# Patient Record
Sex: Male | Born: 1960 | Race: Black or African American | Hispanic: No | Marital: Married | Smoking: Never smoker
Health system: Southern US, Community
[De-identification: ages and names within clinical notes are randomized; demographics above are authoritative.]

## PROBLEM LIST (undated history)

## (undated) DIAGNOSIS — E119 Type 2 diabetes mellitus without complications: Secondary | ICD-10-CM

## (undated) DIAGNOSIS — I10 Essential (primary) hypertension: Secondary | ICD-10-CM

## (undated) HISTORY — PX: HERNIA REPAIR: SHX51

---

## 2021-10-19 ENCOUNTER — Emergency Department: Payer: No Typology Code available for payment source

## 2021-10-19 ENCOUNTER — Emergency Department
Admission: EM | Admit: 2021-10-19 | Discharge: 2021-10-19 | Disposition: A | Discharge status: Home / Self Care | Payer: No Typology Code available for payment source | Attending: Student in an Organized Health Care Education/Training Program | Admitting: Student in an Organized Health Care Education/Training Program

## 2021-10-19 ENCOUNTER — Other Ambulatory Visit: Payer: Self-pay

## 2021-10-19 DIAGNOSIS — R1084 Generalized abdominal pain: Secondary | ICD-10-CM | POA: Insufficient documentation

## 2021-10-19 DIAGNOSIS — I10 Essential (primary) hypertension: Secondary | ICD-10-CM | POA: Diagnosis not present

## 2021-10-19 DIAGNOSIS — R109 Unspecified abdominal pain: Secondary | ICD-10-CM | POA: Diagnosis present

## 2021-10-19 HISTORY — DX: Essential (primary) hypertension: I10

## 2021-10-19 HISTORY — DX: Type 2 diabetes mellitus without complications: E11.9

## 2021-10-19 LAB — URINALYSIS, ROUTINE W REFLEX MICROSCOPIC
Bilirubin Urine: NEGATIVE
Glucose, UA: >=500 mg/dL — AB
Hgb urine dipstick: NEGATIVE
Ketones, ur: NEGATIVE mg/dL
Leukocytes,Ua: NEGATIVE
Nitrite: NEGATIVE
Protein, ur: NEGATIVE mg/dL
Specific Gravity, Urine: 1.025 (ref 1.005–1.030)
Squamous Epithelial / HPF: NONE SEEN (ref 0–5)
WBC, UA: NONE SEEN WBC/hpf (ref 0–5)
pH: 6 (ref 5.0–8.0)

## 2021-10-19 LAB — CBC
HCT: 50.1 % (ref 39.0–52.0)
Hemoglobin: 17 g/dL (ref 13.0–17.0)
MCH: 32.3 pg (ref 26.0–34.0)
MCHC: 33.9 g/dL (ref 30.0–36.0)
MCV: 95.1 fL (ref 80.0–100.0)
Platelets: 222 10*3/uL (ref 150–400)
RBC: 5.27 MIL/uL (ref 4.22–5.81)
RDW: 11.6 % (ref 11.5–15.5)
WBC: 9.8 10*3/uL (ref 4.0–10.5)
nRBC: 0 % (ref 0.0–0.2)

## 2021-10-19 LAB — COMPREHENSIVE METABOLIC PANEL
ALT: 27 U/L (ref 0–44)
AST: 17 U/L (ref 15–41)
Albumin: 2.6 g/dL — ABNORMAL LOW (ref 3.5–5.0)
Alkaline Phosphatase: 106 U/L (ref 38–126)
Anion gap: 10 (ref 5–15)
BUN: 7 mg/dL (ref 6–20)
CO2: 28 mmol/L (ref 22–32)
Calcium: 8.2 mg/dL — ABNORMAL LOW (ref 8.9–10.3)
Chloride: 94 mmol/L — ABNORMAL LOW (ref 98–111)
Creatinine, Ser: 0.79 mg/dL (ref 0.61–1.24)
GFR, Estimated: >60 mL/min (ref >60)
Glucose, Bld: 377 mg/dL — ABNORMAL HIGH (ref 70–99)
Potassium: 3.7 mmol/L (ref 3.5–5.1)
Sodium: 132 mmol/L — ABNORMAL LOW (ref 135–145)
Total Bilirubin: 1.2 mg/dL (ref 0.3–1.2)
Total Protein: 5.9 g/dL — ABNORMAL LOW (ref 6.5–8.1)

## 2021-10-19 LAB — TROPONIN I (HIGH SENSITIVITY)
Troponin I (High Sensitivity): 23 ng/L — ABNORMAL HIGH (ref <18)
Troponin I (High Sensitivity): 24 ng/L — ABNORMAL HIGH (ref <18)

## 2021-10-19 LAB — LIPASE, BLOOD: Lipase: 36 U/L (ref 11–51)

## 2021-10-19 MED ORDER — IOHEXOL 300 MG/ML  SOLN
100.0000 mL | Freq: Once | INTRAMUSCULAR | Status: AC | PRN
  Administered 2021-10-19: 100 mL via INTRAVENOUS

## 2021-10-19 MED ORDER — PANTOPRAZOLE SODIUM 20 MG PO TBEC: 20.0000 mg | DELAYED_RELEASE_TABLET | Freq: Every day | ORAL | 1 refills | Status: DC

## 2021-10-19 MED ORDER — PANTOPRAZOLE SODIUM 40 MG IV SOLR
40.0000 mg | Freq: Once | INTRAVENOUS | Status: AC
  Administered 2021-10-19: 40 mg via INTRAVENOUS
  Filled 2021-10-19: qty 10

## 2021-10-19 MED ORDER — INSULIN ASPART 100 UNIT/ML IJ SOLN
5.0000 [IU] | Freq: Once | INTRAMUSCULAR | Status: AC
  Administered 2021-10-19: 5 [IU] via SUBCUTANEOUS
  Filled 2021-10-19: qty 1

## 2021-10-19 NOTE — Discharge Instructions (Signed)

## 2021-10-19 NOTE — ED Triage Notes (Signed)
Pt comes with c/o abdominal pain, excessive burping, vomiting and decreased appetite. ?

## 2021-10-19 NOTE — ED Notes (Signed)
Pt provided urinal for urine sample.

## 2021-10-19 NOTE — ED Provider Notes (Signed)
? ?[facility] ?Provider Note ? ? ? Event Date/Time  ? First MD Initiated Contact with Patient 10/19/21 0940   ?  (approximate) ? ? ?History  ? ?Abdominal Pain ? ? ?HPI ? ?Bronsen Serano is a 61 y.o. male with a history of hypertension and hiatal hernia presents to the ER for evaluation of worsening epigastric pain belching vomiting and poor p.o. intake.  States his symptoms of getting worse over the past several months.  Has been having intermittent episodes of chest discomfort 2.  Denies any history of GERD or reflux.  Denies any new medications.  Denies any fevers or chills no shortness of breath. ?  ? ? ?Physical Exam  ? ?Triage Vital Signs: ?ED Triage Vitals  ?Enc Vitals Group  ?   BP 10/19/21 0939 (!) 187/95  ?   Pulse Rate 10/19/21 0939 94  ?   Resp 10/19/21 0939 18  ?   Temp 10/19/21 0939 98.4 ?F (36.9 ?C)  ?   Temp Source 10/19/21 0939 Oral  ?   SpO2 10/19/21 0939 97 %  ?   Weight --   ?   Height --   ?   Head Circumference --   ?   Peak Flow --   ?   Pain Score 10/19/21 0937 0  ?   Pain Loc --   ?   Pain Edu? --   ?   Excl. in [place]? --   ? ? ?Most recent vital signs: ?Vitals:  ? 10/19/21 1136 10/19/21 1200  ?BP: (!) 161/90 (!) 173/90  ?Pulse: 70 71  ?Resp: 14 12  ?Temp:    ?SpO2: 92% 92%  ? ? ? ?Constitutional: Alert  ?Eyes: Conjunctivae are normal.  ?Head: Atraumatic. ?Nose: No congestion/rhinnorhea. ?Mouth/Throat: Mucous membranes are moist.   ?Neck: Painless ROM.  ?Cardiovascular:   Good peripheral circulation. ?Respiratory: Normal respiratory effort.  No retractions.  ?Gastrointestinal: Soft and nontender.  ?Musculoskeletal:  no deformity ?Neurologic:  MAE spontaneously. No gross focal neurologic deficits are appreciated.  ?Skin:  Skin is warm, dry and intact. No rash noted. ?Psychiatric: Mood and affect are normal. Speech and behavior are normal. ? ? ? ?ED Results / Procedures / Treatments  ? ?Labs ?(all labs ordered are listed, but only abnormal results are displayed) ?Labs  Reviewed  ?URINALYSIS, ROUTINE W REFLEX MICROSCOPIC - Abnormal; Notable for the following components:  ?    Result Value  ? Color, Urine YELLOW (*)   ? APPearance CLEAR (*)   ? Glucose, UA >=500 (*)   ? Bacteria, UA RARE (*)   ? All other components within normal limits  ?COMPREHENSIVE METABOLIC PANEL - Abnormal; Notable for the following components:  ? Sodium 132 (*)   ? Chloride 94 (*)   ? Glucose, Bld 377 (*)   ? Calcium 8.2 (*)   ? Total Protein 5.9 (*)   ? Albumin 2.6 (*)   ? All other components within normal limits  ?TROPONIN I (HIGH SENSITIVITY) - Abnormal; Notable for the following components:  ? Troponin I (High Sensitivity) 23 (*)   ? All other components within normal limits  ?TROPONIN I (HIGH SENSITIVITY) - Abnormal; Notable for the following components:  ? Troponin I (High Sensitivity) 24 (*)   ? All other components within normal limits  ?CBC  ?LIPASE, BLOOD  ? ? ? ?EKG ? ?ED ECG REPORT ?I, Merlyn Lot, the attending physician, personally viewed and interpreted this ECG. ? ? Date: 10/19/2021 ? EKG  Time: 10:06 ? Rate: 90 ? Rhythm: sinus ? Axis: normal ? Intervals:normal ? ST&T Change: lvh, nonspecific st and t wave abn, no stemi criteria ? ? ? ?RADIOLOGY ?Please see ED Course for my review and interpretation. ? ?I personally reviewed all radiographic images ordered to evaluate for the above acute complaints and reviewed radiology reports and findings.  These findings were personally discussed with the patient.  Please see medical record for radiology report. ? ? ? ?PROCEDURES: ? ?Critical Care performed: No ? ?Procedures ? ? ?MEDICATIONS ORDERED IN ED: ?Medications  ?insulin aspart (novoLOG) injection 5 Units (has no administration in time range)  ?pantoprazole (PROTONIX) injection 40 mg (40 mg Intravenous Given 10/19/21 1009)  ?iohexol (OMNIPAQUE) 300 MG/ML solution 100 mL (100 mLs Intravenous Contrast Given 10/19/21 1321)  ? ? ? ?IMPRESSION / MDM / ASSESSMENT AND PLAN / ED COURSE  ?I reviewed the  triage vital signs and the nursing notes. ?             ?               ? ?Differential diagnosis includes, but is not limited to, gastritis, hernia, obstruction, mass, perforation, diverticulitis, PUD, ACS ? ?Presents with symptoms as described above.  Symptoms have been ongoing for quite some time.  Denies any active chest pain or pressure.  EKG is abnormal initial troponin mildly elevated will order repeat troponin as this may be a chronic abnormality for him.  His abdominal exam is benign but given his age and risk factors will order CT imaging for the above differential. ? ?Clinical Course as of 10/19/21 1354  ?Thu Oct 19, 2021  ?1002 Chest x-ray on my review and interpretation shows no evidence of pneumothorax. [PR]  ?1326 Hyperglycemia no acidemia renal function normal. [PR]  ?76 CT abdomen pelvis by my review and interpretation does not show any evidence of SBO or acute intra-abdominal abnormality will await formal radiology report. [PR]  ?1349 Discussed results of CT imaging with the patient.  No acute abnormality.  Is mildly hyperglycemic.  Troponins are stable.  He is denying any chest pain at this time.  I suspect gastritis will give referral to outpatient follow-up I do not feel he requires hospitalization at this time.  Patient agreeable to plan. [PR]  ?  ?Clinical Course User Index ?[PR] Merlyn Lot, MD  ? ? ? ?FINAL CLINICAL IMPRESSION(S) / ED DIAGNOSES  ? ?Final diagnoses:  ?Generalized abdominal pain  ? ? ? ?Rx / DC Orders  ? ?ED Discharge Orders   ? ?      Ordered  ?  pantoprazole (PROTONIX) 20 MG tablet  Daily       ? 10/19/21 1352  ? ?  ?  ? ?  ? ? ? ?Note:  This document was prepared using Dragon voice recognition software and may include unintentional dictation errors. ? ?  ?Merlyn Lot, MD ?10/19/21 1354 ? ?

## 2021-10-19 NOTE — ED Notes (Signed)
Lab called about pending CMP and Lipase, lab states Blood work still running.  ?

## 2021-11-10 ENCOUNTER — Encounter: Payer: Self-pay | Admitting: Emergency Medicine

## 2021-11-10 ENCOUNTER — Emergency Department: Payer: No Typology Code available for payment source

## 2021-11-10 ENCOUNTER — Emergency Department
Admission: EM | Admit: 2021-11-10 | Discharge: 2021-11-10 | Disposition: A | Discharge status: Home / Self Care | Payer: No Typology Code available for payment source | Attending: Emergency Medicine | Admitting: Emergency Medicine

## 2021-11-10 ENCOUNTER — Other Ambulatory Visit: Payer: Self-pay

## 2021-11-10 DIAGNOSIS — R066 Hiccough: Secondary | ICD-10-CM | POA: Diagnosis present

## 2021-11-10 DIAGNOSIS — R142 Eructation: Secondary | ICD-10-CM | POA: Insufficient documentation

## 2021-11-10 DIAGNOSIS — R739 Hyperglycemia, unspecified: Secondary | ICD-10-CM | POA: Diagnosis not present

## 2021-11-10 DIAGNOSIS — R1084 Generalized abdominal pain: Secondary | ICD-10-CM | POA: Diagnosis not present

## 2021-11-10 DIAGNOSIS — R112 Nausea with vomiting, unspecified: Secondary | ICD-10-CM | POA: Insufficient documentation

## 2021-11-10 LAB — CBC
HCT: 47.8 % (ref 39.0–52.0)
Hemoglobin: 16.5 g/dL (ref 13.0–17.0)
MCH: 31.8 pg (ref 26.0–34.0)
MCHC: 34.5 g/dL (ref 30.0–36.0)
MCV: 92.1 fL (ref 80.0–100.0)
Platelets: 272 10*3/uL (ref 150–400)
RBC: 5.19 MIL/uL (ref 4.22–5.81)
RDW: 11.2 % — ABNORMAL LOW (ref 11.5–15.5)
WBC: 9.6 10*3/uL (ref 4.0–10.5)
nRBC: 0 % (ref 0.0–0.2)

## 2021-11-10 LAB — COMPREHENSIVE METABOLIC PANEL
ALT: 21 U/L (ref 0–44)
AST: 25 U/L (ref 15–41)
Albumin: 3 g/dL — ABNORMAL LOW (ref 3.5–5.0)
Alkaline Phosphatase: 98 U/L (ref 38–126)
Anion gap: 13 (ref 5–15)
BUN: 8 mg/dL (ref 6–20)
CO2: 21 mmol/L — ABNORMAL LOW (ref 22–32)
Calcium: 8.2 mg/dL — ABNORMAL LOW (ref 8.9–10.3)
Chloride: 93 mmol/L — ABNORMAL LOW (ref 98–111)
Creatinine, Ser: 1.02 mg/dL (ref 0.61–1.24)
GFR, Estimated: >60 mL/min (ref >60)
Glucose, Bld: 651 mg/dL (ref 70–99)
Potassium: 3.7 mmol/L (ref 3.5–5.1)
Sodium: 127 mmol/L — ABNORMAL LOW (ref 135–145)
Total Bilirubin: 1.1 mg/dL (ref 0.3–1.2)
Total Protein: 6.7 g/dL (ref 6.5–8.1)

## 2021-11-10 LAB — LIPASE, BLOOD: Lipase: 47 U/L (ref 11–51)

## 2021-11-10 LAB — CBG MONITORING, ED: Glucose-Capillary: 285 mg/dL — ABNORMAL HIGH (ref 70–99)

## 2021-11-10 MED ORDER — OMEPRAZOLE 10 MG PO CPDR: 10.0000 mg | DELAYED_RELEASE_CAPSULE | Freq: Every day | ORAL | 3 refills | Status: DC

## 2021-11-10 MED ORDER — BACLOFEN 10 MG PO TABS
10.0000 mg | ORAL_TABLET | Freq: Three times a day (TID) | ORAL | Status: DC
  Administered 2021-11-10: 10 mg via ORAL
  Filled 2021-11-10 (×2): qty 1

## 2021-11-10 MED ORDER — BACLOFEN 10 MG PO TABS
10.0000 mg | ORAL_TABLET | Freq: Every day | ORAL | 1 refills | Status: DC | PRN
Start: 2021-11-10 — End: 2022-07-10

## 2021-11-10 MED ORDER — SODIUM CHLORIDE 0.9 % IV BOLUS
2000.0000 mL | Freq: Once | INTRAVENOUS | Status: AC
  Administered 2021-11-10: 2000 mL via INTRAVENOUS

## 2021-11-10 MED ORDER — FAMOTIDINE 20 MG PO TABS: 20.0000 mg | ORAL_TABLET | Freq: Every day | ORAL | 3 refills | Status: DC

## 2021-11-10 MED ORDER — LIDOCAINE VISCOUS HCL 2 % MT SOLN
15.0000 mL | Freq: Once | OROMUCOSAL | Status: AC
  Administered 2021-11-10: 15 mL via ORAL
  Filled 2021-11-10: qty 15

## 2021-11-10 MED ORDER — INSULIN ASPART 100 UNIT/ML IJ SOLN
15.0000 [IU] | Freq: Once | INTRAMUSCULAR | Status: AC
  Administered 2021-11-10: 15 [IU] via INTRAVENOUS
  Filled 2021-11-10: qty 1

## 2021-11-10 MED ORDER — IOHEXOL 300 MG/ML  SOLN
100.0000 mL | Freq: Once | INTRAMUSCULAR | Status: AC | PRN
  Administered 2021-11-10: 100 mL via INTRAVENOUS
  Filled 2021-11-10: qty 100

## 2021-11-10 MED ORDER — ALUM & MAG HYDROXIDE-SIMETH 200-200-20 MG/5ML PO SUSP
30.0000 mL | Freq: Once | ORAL | Status: AC
  Administered 2021-11-10: 30 mL via ORAL
  Filled 2021-11-10: qty 30

## 2021-11-10 NOTE — ED Provider Triage Note (Signed)
Emergency Medicine Provider Triage Evaluation Note ? ?Corey Petersen , a 61 y.o. male  was evaluated in triage.  Pt complains of belching and hiccups all day every day for a year.  Patient states that he has been worked up multiple times, never received a definitive answer but will have hiccups and burping until he vomits.  Patient denies any fevers or chills, no new symptoms, does states that these have been ongoing for a year and he needs to be seen.. ? ?Review of Systems  ?Positive: Hiccups, belching, emesis ?Negative: Fevers, chills, URI symptoms ? ?Physical Exam  ?BP (!) 153/90   Pulse (!) 114   Temp 98.4 ?F (36.9 ?C) (Oral)   Resp 20   Ht '5\' 7"'$  (1.702 m)   Wt 113.4 kg   SpO2 98%   BMI 39.16 kg/m?  ?Gen:   Awake, no distress   ?Resp:  Normal effort  ?MSK:   Moves extremities without difficulty  ?Other:  Bowel sounds present.  No definitive tenderness to palpation. ? ?Medical Decision Making  ?Medically screening exam initiated at 4:39 PM.  Appropriate orders placed.  Corey Petersen was informed that the remainder of the evaluation will be completed by another provider, this initial triage assessment does not replace that evaluation, and the importance of remaining in the ED until their evaluation is complete. ? ?Patient presents with belching and hiccups for reportedly a year.  Patient states that he has repetitive episodes of emesis with same.  He has been worked up multiple times without any definitive answer according to the patient.  Patient will have labs at this time. ?  ?Darletta Moll, PA-C ?11/10/21 1639 ? ?

## 2021-11-10 NOTE — ED Provider Notes (Signed)
? ?[facility] ?Provider Note ? ?Patient Contact: 5:00 PM (approximate) ? ? ?History  ? ?Emesis ? ? ?HPI ? ?Corey Petersen is a 61 y.o. male who presents the emergency department complaining of ongoing belching, hiccups and emesis.  Patient reports that this has been ongoing times a year.  He has been seen multiple places and states that he has never been given a reason just started on medications.  Patient states that sometimes the medicines appear to be more effective, sometimes less effective.  Patient states that today the medicines been less effective and has had steady belching, as well as multiple episodes of emesis.  No hematic emesis.  He denies any abdominal pain.  Patient denies any constipation, diarrhea, urinary changes. ?  ? ? ?Physical Exam  ? ?Triage Vital Signs: ?ED Triage Vitals  ?Enc Vitals Group  ?   BP 11/10/21 1629 (!) 153/90  ?   Pulse Rate 11/10/21 1629 (!) 114  ?   Resp 11/10/21 1629 20  ?   Temp 11/10/21 1629 98.4 ?F (36.9 ?C)  ?   Temp Source 11/10/21 1629 Oral  ?   SpO2 11/10/21 1629 98 %  ?   Weight 11/10/21 1630 250 lb (113.4 kg)  ?   Height 11/10/21 1630 '5\' 7"'$  (1.702 m)  ?   Head Circumference --   ?   Peak Flow --   ?   Pain Score 11/10/21 1630 0  ?   Pain Loc --   ?   Pain Edu? --   ?   Excl. in [place]? --   ? ? ?Most recent vital signs: ?Vitals:  ? 11/10/21 1756 11/10/21 2020  ?BP: (!) 140/99 (!) 165/90  ?Pulse: 87 82  ?Resp: 18 18  ?Temp: 98.4 ?F (36.9 ?C)   ?SpO2: 98% 98%  ? ? ? ?General: Alert and in no acute distress. ?ENT: ?     Ears:  ?     Nose: No congestion/rhinnorhea. ?     Mouth/Throat: Mucous membranes are moist.  ?Cardiovascular:  Good peripheral perfusion ?Respiratory: Normal respiratory effort without tachypnea or retractions. Lungs CTAB. Good air entry to the bases with no decreased or absent breath sounds. ?Gastrointestinal: Bowel sounds ?4 quadrants.  Soft to palpation globally.  Patient reports mild global tenderness but no point specific  tenderness.. No guarding or rigidity. No palpable masses. No distention. No CVA tenderness. ?Musculoskeletal: Full range of motion to all extremities.  ?Neurologic:  No gross focal neurologic deficits are appreciated.  ?Skin:   No rash noted ?Other: ? ? ?ED Results / Procedures / Treatments  ? ?Labs ?(all labs ordered are listed, but only abnormal results are displayed) ?Labs Reviewed  ?COMPREHENSIVE METABOLIC PANEL - Abnormal; Notable for the following components:  ?    Result Value  ? Sodium 127 (*)   ? Chloride 93 (*)   ? CO2 21 (*)   ? Glucose, Bld 651 (*)   ? Calcium 8.2 (*)   ? Albumin 3.0 (*)   ? All other components within normal limits  ?CBC - Abnormal; Notable for the following components:  ? RDW 11.2 (*)   ? All other components within normal limits  ?CBG MONITORING, ED - Abnormal; Notable for the following components:  ? Glucose-Capillary 285 (*)   ? All other components within normal limits  ?LIPASE, BLOOD  ? ? ? ?EKG ? ? ? ? ?RADIOLOGY ? ?I personally viewed and evaluated these images as part of my  medical decision making, as well as reviewing the written report by the radiologist. ? ?ED Provider Interpretation: No acute findings on CT to include obstruction, cholecystitis, pancreatitis, gastric perforation ? ?CT ABDOMEN PELVIS W CONTRAST ? ?Result Date: 11/10/2021 ?CLINICAL DATA:  Nausea and vomiting.  Hiccups and belching. EXAM: CT ABDOMEN AND PELVIS WITH CONTRAST TECHNIQUE: Multidetector CT imaging of the abdomen and pelvis was performed using the standard protocol following bolus administration of intravenous contrast. RADIATION DOSE REDUCTION: This exam was performed according to the departmental dose-optimization program which includes automated exposure control, adjustment of the mA and/or kV according to patient size and/or use of iterative reconstruction technique. CONTRAST:  13m OMNIPAQUE IOHEXOL 300 MG/ML  SOLN COMPARISON:  10/19/2021 FINDINGS: Lower chest: Normal Hepatobiliary: Advanced  steatosis of the liver. Calcified stones dependent within the gallbladder. No CT evidence of gallbladder inflammation or ductal stone. Pancreas: Normal Spleen: Normal Adrenals/Urinary Tract: Adrenal glands are normal. Right kidney is normal. Left kidney again shows a simple appearing cyst measuring 8.2 cm in diameter. No follow-up recommended. Bladder is normal. Stomach/Bowel: Stomach and small intestine are normal. No sign of bowel obstruction or inflammation. Normal appendix. Normal colon. Vascular/Lymphatic: Aortic atherosclerosis. No aneurysm. IVC is normal. No adenopathy. Reproductive: Normal Other: No free fluid or air. Musculoskeletal: Lower lumbar degenerative changes. IMPRESSION: Diffuse steatosis of the liver which can be symptomatic. Chololithiasis without CT evidence of cholecystitis or choledocholithiasis. Aortic atherosclerosis. Electronically Signed   By: MNelson ChimesM.D.   On: 11/10/2021 18:38   ? ?PROCEDURES: ? ?Critical Care performed: No ? ?Procedures ? ? ?MEDICATIONS ORDERED IN ED: ?Medications  ?baclofen (LIORESAL) tablet 10 mg (has no administration in time range)  ?sodium chloride 0.9 % bolus 2,000 mL (2,000 mLs Intravenous New Bag/Given 11/10/21 1755)  ?insulin aspart (novoLOG) injection 15 Units (15 Units Intravenous Given 11/10/21 1850)  ?iohexol (OMNIPAQUE) 300 MG/ML solution 100 mL (100 mLs Intravenous Contrast Given 11/10/21 1806)  ?alum & mag hydroxide-simeth (MAALOX/MYLANTA) 200-200-20 MG/5ML suspension 30 mL (30 mLs Oral Given 11/10/21 2013)  ?  And  ?lidocaine (XYLOCAINE) 2 % viscous mouth solution 15 mL (15 mLs Oral Given 11/10/21 2013)  ? ? ? ?IMPRESSION / MDM / ASSESSMENT AND PLAN / ED COURSE  ?I reviewed the triage vital signs and the nursing notes. ?             ?               ? ?Differential diagnosis includes, but is not limited to, GERD, obstruction, gastric perforation, pancreatitis, cholecystitis, colitis ? ? ?Patient's diagnosis is consistent with generalized abdominal pain with  belching and hiccups.  Patient presents emergency department with symptoms that he states have been ongoing times a year.  [place] [place] reassuring work-ups.  Patient has been on Protonix but states that it is not alleviating his symptoms.  Patient had labs, imaging performed today.  Imaging is reassuring with no acute findings.  Labs are overall reassuring with CBC and CMP with the exception of his glucose which is significantly elevated.  Patient states that he checks his blood sugar daily but did not check it today.  He did not take his diabetes medications which includes insulin and metformin today as well.  Patient is given fluids, insulin and has a significant improvement in his glucose from 651 to 285.  Patient was given baclofen for hiccups, GI cocktail for belching.  Symptoms have improved  with medication.  I will prescribed baclofen for as needed use for hiccups, and change the patient from Protonix to omeprazole and famotidine.  He has an appointment next week with GI.  Patient is instructed to keep this point.  Concerning signs and symptoms are discussed with the patient..  Patient is given ED precautions to return to the ED for any worsening or new symptoms. ? ? ? ?  ? ? ?FINAL CLINICAL IMPRESSION(S) / ED DIAGNOSES  ? ?Final diagnoses:  ?Generalized abdominal pain  ?Belching  ?Hiccups  ?Hyperglycemia  ? ? ? ?Rx / DC Orders  ? ?ED Discharge Orders   ? ?      Ordered  ?  famotidine (PEPCID) 20 MG tablet  Daily       ? 11/10/21 2030  ?  omeprazole (PRILOSEC) 10 MG capsule  Daily       ? 11/10/21 2030  ?  baclofen (LIORESAL) 10 MG tablet  Daily PRN       ? 11/10/21 2030  ? ?  ?  ? ?  ? ? ? ?Note:  This document was prepared using Dragon voice recognition software and may include unintentional dictation errors. ?  ?Darletta Moll, PA-C ?11/10/21 2030 ? ?  ?Arta Silence, MD ?11/10/21 2258 ? ?

## 2021-11-10 NOTE — ED Triage Notes (Signed)
Pt in via POV, reports ongoing belching and N/V x 1 day.  Reports his reflux medication does not help.  Patient belching in triage, denies any pain.  Ambulatory to triage, NAD noted at this time.   ?

## 2022-01-27 ENCOUNTER — Emergency Department: Payer: No Typology Code available for payment source

## 2022-01-27 ENCOUNTER — Emergency Department
Admission: EM | Admit: 2022-01-27 | Discharge: 2022-01-27 | Disposition: A | Discharge status: Home / Self Care | Payer: No Typology Code available for payment source | Attending: Emergency Medicine | Admitting: Emergency Medicine

## 2022-01-27 ENCOUNTER — Encounter: Payer: Self-pay | Admitting: Emergency Medicine

## 2022-01-27 DIAGNOSIS — R000 Tachycardia, unspecified: Secondary | ICD-10-CM | POA: Insufficient documentation

## 2022-01-27 DIAGNOSIS — R142 Eructation: Secondary | ICD-10-CM | POA: Diagnosis present

## 2022-01-27 DIAGNOSIS — E119 Type 2 diabetes mellitus without complications: Secondary | ICD-10-CM | POA: Insufficient documentation

## 2022-01-27 DIAGNOSIS — I10 Essential (primary) hypertension: Secondary | ICD-10-CM | POA: Diagnosis not present

## 2022-01-27 LAB — CBC WITH DIFFERENTIAL/PLATELET
Abs Immature Granulocytes: 0.03 10*3/uL (ref 0.00–0.07)
Basophils Absolute: 0 10*3/uL (ref 0.0–0.1)
Basophils Relative: 0 %
Eosinophils Absolute: 0.1 10*3/uL (ref 0.0–0.5)
Eosinophils Relative: 1 %
HCT: 47.5 % (ref 39.0–52.0)
Hemoglobin: 16.6 g/dL (ref 13.0–17.0)
Immature Granulocytes: 0 %
Lymphocytes Relative: 31 %
Lymphs Abs: 2.9 10*3/uL (ref 0.7–4.0)
MCH: 31.7 pg (ref 26.0–34.0)
MCHC: 34.9 g/dL (ref 30.0–36.0)
MCV: 90.8 fL (ref 80.0–100.0)
Monocytes Absolute: 0.7 10*3/uL (ref 0.1–1.0)
Monocytes Relative: 8 %
Neutro Abs: 5.5 10*3/uL (ref 1.7–7.7)
Neutrophils Relative %: 60 %
Platelets: 304 10*3/uL (ref 150–400)
RBC: 5.23 MIL/uL (ref 4.22–5.81)
RDW: 11.4 % — ABNORMAL LOW (ref 11.5–15.5)
WBC: 9.3 10*3/uL (ref 4.0–10.5)
nRBC: 0 % (ref 0.0–0.2)

## 2022-01-27 LAB — COMPREHENSIVE METABOLIC PANEL
ALT: 15 U/L (ref 0–44)
AST: 18 U/L (ref 15–41)
Albumin: 3.1 g/dL — ABNORMAL LOW (ref 3.5–5.0)
Alkaline Phosphatase: 110 U/L (ref 38–126)
Anion gap: 9 (ref 5–15)
BUN: 9 mg/dL (ref 8–23)
CO2: 27 mmol/L (ref 22–32)
Calcium: 8.4 mg/dL — ABNORMAL LOW (ref 8.9–10.3)
Chloride: 95 mmol/L — ABNORMAL LOW (ref 98–111)
Creatinine, Ser: 0.87 mg/dL (ref 0.61–1.24)
GFR, Estimated: >60 mL/min (ref >60)
Glucose, Bld: 383 mg/dL — ABNORMAL HIGH (ref 70–99)
Potassium: 3.4 mmol/L — ABNORMAL LOW (ref 3.5–5.1)
Sodium: 131 mmol/L — ABNORMAL LOW (ref 135–145)
Total Bilirubin: 1 mg/dL (ref 0.3–1.2)
Total Protein: 6.8 g/dL (ref 6.5–8.1)

## 2022-01-27 LAB — TROPONIN I (HIGH SENSITIVITY)
Troponin I (High Sensitivity): 21 ng/L — ABNORMAL HIGH (ref <18)
Troponin I (High Sensitivity): 21 ng/L — ABNORMAL HIGH (ref <18)

## 2022-01-27 LAB — LIPASE, BLOOD: Lipase: 34 U/L (ref 11–51)

## 2022-01-27 MED ORDER — DROPERIDOL 2.5 MG/ML IJ SOLN
2.5000 mg | Freq: Once | INTRAMUSCULAR | Status: AC
  Administered 2022-01-27: 2.5 mg via INTRAVENOUS
  Filled 2022-01-27: qty 2

## 2022-01-27 MED ORDER — LACTATED RINGERS IV BOLUS
1000.0000 mL | Freq: Once | INTRAVENOUS | Status: AC
  Administered 2022-01-27: 1000 mL via INTRAVENOUS

## 2022-01-27 NOTE — ED Notes (Signed)
Pt awake, belching/hiccups returned

## 2022-01-27 NOTE — ED Triage Notes (Signed)
C/O constant burping since 0300.  C/O left upper quadrant Abdominal pain.  AAOx3.  Skin warm and dry. NAD

## 2022-02-13 ENCOUNTER — Ambulatory Visit (INDEPENDENT_AMBULATORY_CARE_PROVIDER_SITE_OTHER): Payer: Medicare Other | Admitting: Gastroenterology

## 2022-02-13 ENCOUNTER — Other Ambulatory Visit: Payer: Self-pay

## 2022-02-13 ENCOUNTER — Encounter: Payer: Self-pay | Admitting: Gastroenterology

## 2022-02-13 VITALS — BP 163/103 | HR 105 | Temp 98.4°F | Ht 67.0 in | Wt 222.0 lb

## 2022-02-13 DIAGNOSIS — R142 Eructation: Secondary | ICD-10-CM | POA: Diagnosis not present

## 2022-02-13 DIAGNOSIS — R1013 Epigastric pain: Secondary | ICD-10-CM

## 2022-02-13 DIAGNOSIS — E785 Hyperlipidemia, unspecified: Secondary | ICD-10-CM | POA: Insufficient documentation

## 2022-02-13 DIAGNOSIS — R945 Abnormal results of liver function studies: Secondary | ICD-10-CM | POA: Insufficient documentation

## 2022-02-13 DIAGNOSIS — G4730 Sleep apnea, unspecified: Secondary | ICD-10-CM | POA: Insufficient documentation

## 2022-02-13 DIAGNOSIS — R634 Abnormal weight loss: Secondary | ICD-10-CM

## 2022-02-13 DIAGNOSIS — Z86010 Personal history of colonic polyps: Secondary | ICD-10-CM | POA: Diagnosis not present

## 2022-02-13 DIAGNOSIS — E119 Type 2 diabetes mellitus without complications: Secondary | ICD-10-CM | POA: Insufficient documentation

## 2022-02-13 DIAGNOSIS — E1169 Type 2 diabetes mellitus with other specified complication: Secondary | ICD-10-CM | POA: Insufficient documentation

## 2022-02-13 DIAGNOSIS — I10 Essential (primary) hypertension: Secondary | ICD-10-CM | POA: Insufficient documentation

## 2022-02-13 DIAGNOSIS — E559 Vitamin D deficiency, unspecified: Secondary | ICD-10-CM | POA: Insufficient documentation

## 2022-02-13 DIAGNOSIS — I152 Hypertension secondary to endocrine disorders: Secondary | ICD-10-CM | POA: Insufficient documentation

## 2022-02-13 MED ORDER — NA SULFATE-K SULFATE-MG SULF 17.5-3.13-1.6 GM/177ML PO SOLN: 1.0000 | Freq: Once | ORAL | 0 refills | Status: AC

## 2022-02-13 NOTE — Progress Notes (Unsigned)
Gastroenterology Consultation  Referring Provider:     [place] Physician:  [facility] Gastroenterologist:  Dr. Allen Petersen     Reason for Consultation:     Burping, hiccups, coughing and body pains        HPI:   Corey Petersen is a 61 y.o. y/o male referred for consultation & management of burping, hiccups, coughing and body pains by Dr. Domingo Petersen, [facility].  This patient comes in today after being in the ER twice with episodes of burping with hiccups which leads to the patient having coughing spells.  The patient reports that he has diffuse abdominal pain associated with this.  He also reports that the symptoms are also associated with body pains in his back legs knees and throughout his body. The patient had a CT scan of the abdomen and pelvis in April that showed:  IMPRESSION: Diffuse steatosis of the liver which can be symptomatic.   Chololithiasis without CT evidence of cholecystitis or choledocholithiasis.   Aortic atherosclerosis.  Besides being seen in the emergency room on 24 June the patient was seen on 7 April with report of belching hiccups and emesis.  The patient comes with his wife who reports that the patient is not the same person he was a year ago when this all started.  The patient also reports that he has lost 30 pounds in the last year.  He has been seen in the past with a colonoscopy in 2019 at the [place].  During the interview today the patient was speaking normally in full sentences and then started to have the symptoms which included continuous, uninterrupted burping with body twitching, facial grimacing tensing of the neck muscles which resulted in the patient having coughing fits.  This continued during the rest of the interview and examination.  When asked what happened a year ago that may have started all of these issues the patient reported that he believes somebody was spiking the beer he was buying at the  local [place] with weight loss medication so that they could take away his Social Security benefits.  Past Medical History:  Diagnosis Date   Diabetes mellitus without complication ([place])    Hypertension     Past Surgical History:  Procedure Laterality Date   HERNIA REPAIR      Prior to Admission medications   Medication Sig Start Date End Date Taking? Authorizing Provider  baclofen (LIORESAL) 10 MG tablet Take 1 tablet (10 mg total) by mouth daily as needed (Hiccups). 11/10/21 11/10/22  Corey Petersen, Corey Bills, Corey Petersen  famotidine (PEPCID) 20 MG tablet Take 1 tablet (20 mg total) by mouth daily. 11/10/21 11/10/22  Corey Petersen, Corey Bills, Corey Petersen  omeprazole (PRILOSEC) 10 MG capsule Take 1 capsule (10 mg total) by mouth daily. 11/10/21   Corey Petersen, Corey Bills, Corey Petersen  pantoprazole (PROTONIX) 20 MG tablet Take 1 tablet (20 mg total) by mouth daily. 10/19/21 10/19/22  Corey Lot, MD    No family history on file.   Social History   Tobacco Use   Smoking status: Never   Smokeless tobacco: Never  Vaping Use   Vaping Use: Never used  Substance Use Topics   Alcohol use: Never   Drug use: Never    Allergies as of 02/13/2022 - Review Complete 02/13/2022  Allergen Reaction Noted   Ace inhibitors Cough 03/15/2016    Review of Systems:    All systems reviewed and negative except where noted in  HPI.   Physical Exam:  There were no vitals taken for this visit. No LMP for male patient. General:   Alert,  Well-developed, well-nourished, pleasant and cooperative in NAD Head:  Normocephalic and atraumatic. Eyes:  Sclera clear, no icterus.   Conjunctiva pink. Ears:  Normal auditory acuity. Neck:  Supple; no masses or thyromegaly. Lungs:  Respirations even and unlabored.  Clear throughout to auscultation.   No wheezes, crackles, or rhonchi. No acute distress. Heart:  Regular rate and rhythm; no murmurs, clicks, rubs, or gallops. Abdomen:  Normal bowel sounds.  No bruits.  Soft, non-tender  and non-distended without masses, hepatosplenomegaly or hernias noted.  No guarding or rebound tenderness.  Negative Carnett sign.   Rectal:  Deferred.  Pulses:  Normal pulses noted. Extremities:  No clubbing or edema.  No cyanosis. Neurologic:  Alert and oriented x3;  grossly normal neurologically. Skin:  Intact without significant lesions or rashes.  No jaundice. Lymph Nodes:  No significant cervical adenopathy. Psych:  Alert and cooperative. Normal mood and affect.  Imaging Studies: DG Chest 2 View  Result Date: 01/27/2022 CLINICAL DATA:  Belching. EXAM: CHEST - 2 VIEW COMPARISON:  Chest radiograph dated October 19, 2021 FINDINGS: The heart size and mediastinal contours are within normal limits. Both lungs are clear. The visualized skeletal structures are unremarkable. IMPRESSION: No active cardiopulmonary disease. Electronically Signed   By: Corey Petersen D.O.   On: 01/27/2022 14:01    Assessment and Plan:   Corey Petersen is a 61 y.o. y/o male who comes in today with symptoms described above including acute onset during the interview of facial grimacing, continuous burping, coughing and muscle twitching.  The patient on further questioning reported that he believes it started around the time he thinks that the local [place] was spiking his beer with weight loss medication so his Social Security benefits could be withdrawn. I have explained to the patient and his wife that with the large amount of air he is producing during the entire encounter was not consistent with his body being able to hold that large amount of air and he is swallowing the air and bring it back up.  I have also discussed with them the other issues unrelated to his GI tract like the facial grimacing neck flexing twitching coughing and body aches that he has diffusely throughout his body as this is likely not being a GI issue. Due to the patient's history of colon polyps and need for colonoscopy at this time and his  weight loss the patient will be set up for an EGD and colonoscopy to rule out any GI pathology but they have been reassured that while of his symptoms are likely not GI related.  The patient and his wife have been explained the plan and agree with it.    Corey Lame, MD. Corey Petersen    Note: This dictation was prepared with Dragon dictation along with smaller phrase technology. Any transcriptional errors that result from this process are unintentional.

## 2022-02-13 NOTE — H&P (View-Only) (Signed)
Gastroenterology Consultation  Referring Provider:     [place] Physician:  Center, [facility] Primary Gastroenterologist:  Dr. Allen Norris     Reason for Consultation:     Burping, hiccups, coughing and body pains        HPI:   Corey Petersen is a 61 y.o. y/o male referred for consultation & management of burping, hiccups, coughing and body pains by Dr. Domingo Madeira, [facility].  This patient comes in today after being in the ER twice with episodes of burping with hiccups which leads to the patient having coughing spells.  The patient reports that he has diffuse abdominal pain associated with this.  He also reports that the symptoms are also associated with body pains in his back legs knees and throughout his body. The patient had a CT scan of the abdomen and pelvis in April that showed:  IMPRESSION: Diffuse steatosis of the liver which can be symptomatic.   Chololithiasis without CT evidence of cholecystitis or choledocholithiasis.   Aortic atherosclerosis.  Besides being seen in the emergency room on 24 June the patient was seen on 7 April with report of belching hiccups and emesis.  The patient comes with his wife who reports that the patient is not the same person he was a year ago when this all started.  The patient also reports that he has lost 30 pounds in the last year.  He has been seen in the past with a colonoscopy in 2019 at the [place].  During the interview today the patient was speaking normally in full sentences and then started to have the symptoms which included continuous, uninterrupted burping with body twitching, facial grimacing tensing of the neck muscles which resulted in the patient having coughing fits.  This continued during the rest of the interview and examination.  When asked what happened a year ago that may have started all of these issues the patient reported that he believes somebody was spiking the beer he was buying at the  local [place] with weight loss medication so that they could take away his Social Security benefits.  Past Medical History:  Diagnosis Date   Diabetes mellitus without complication ([place])    Hypertension     Past Surgical History:  Procedure Laterality Date   HERNIA REPAIR      Prior to Admission medications   Medication Sig Start Date End Date Taking? Authorizing Provider  baclofen (LIORESAL) 10 MG tablet Take 1 tablet (10 mg total) by mouth daily as needed (Hiccups). 11/10/21 11/10/22  Cuthriell, Charline Bills, PA-C  famotidine (PEPCID) 20 MG tablet Take 1 tablet (20 mg total) by mouth daily. 11/10/21 11/10/22  Cuthriell, Charline Bills, PA-C  omeprazole (PRILOSEC) 10 MG capsule Take 1 capsule (10 mg total) by mouth daily. 11/10/21   Cuthriell, Charline Bills, PA-C  pantoprazole (PROTONIX) 20 MG tablet Take 1 tablet (20 mg total) by mouth daily. 10/19/21 10/19/22  Merlyn Lot, MD    No family history on file.   Social History   Tobacco Use   Smoking status: Never   Smokeless tobacco: Never  Vaping Use   Vaping Use: Never used  Substance Use Topics   Alcohol use: Never   Drug use: Never    Allergies as of 02/13/2022 - Review Complete 02/13/2022  Allergen Reaction Noted   Ace inhibitors Cough 03/15/2016    Review of Systems:    All systems reviewed and negative except where noted in  HPI.   Physical Exam:  There were no vitals taken for this visit. No LMP for male patient. General:   Alert,  Well-developed, well-nourished, pleasant and cooperative in NAD Head:  Normocephalic and atraumatic. Eyes:  Sclera clear, no icterus.   Conjunctiva pink. Ears:  Normal auditory acuity. Neck:  Supple; no masses or thyromegaly. Lungs:  Respirations even and unlabored.  Clear throughout to auscultation.   No wheezes, crackles, or rhonchi. No acute distress. Heart:  Regular rate and rhythm; no murmurs, clicks, rubs, or gallops. Abdomen:  Normal bowel sounds.  No bruits.  Soft, non-tender  and non-distended without masses, hepatosplenomegaly or hernias noted.  No guarding or rebound tenderness.  Negative Carnett sign.   Rectal:  Deferred.  Pulses:  Normal pulses noted. Extremities:  No clubbing or edema.  No cyanosis. Neurologic:  Alert and oriented x3;  grossly normal neurologically. Skin:  Intact without significant lesions or rashes.  No jaundice. Lymph Nodes:  No significant cervical adenopathy. Psych:  Alert and cooperative. Normal mood and affect.  Imaging Studies: DG Chest 2 View  Result Date: 01/27/2022 CLINICAL DATA:  Belching. EXAM: CHEST - 2 VIEW COMPARISON:  Chest radiograph dated October 19, 2021 FINDINGS: The heart size and mediastinal contours are within normal limits. Both lungs are clear. The visualized skeletal structures are unremarkable. IMPRESSION: No active cardiopulmonary disease. Electronically Signed   By: Keane Police D.O.   On: 01/27/2022 14:01    Assessment and Plan:   [name] is a 61 y.o. y/o male who comes in today with symptoms described above including acute onset during the interview of facial grimacing, continuous burping, coughing and muscle twitching.  The patient on further questioning reported that he believes it started around the time he thinks that the local [place] was spiking his beer with weight loss medication so his Social Security benefits could be withdrawn. I have explained to the patient and his wife that with the large amount of air he is producing during the entire encounter was not consistent with his body being able to hold that large amount of air and he is swallowing the air and bring it back up.  I have also discussed with them the other issues unrelated to his GI tract like the facial grimacing neck flexing twitching coughing and body aches that he has diffusely throughout his body as this is likely not being a GI issue. Due to the patient's history of colon polyps and need for colonoscopy at this time and his  weight loss the patient will be set up for an EGD and colonoscopy to rule out any GI pathology but they have been reassured that while of his symptoms are likely not GI related.  The patient and his wife have been explained the plan and agree with it.    Lucilla Lame, MD. Marval Regal    Note: This dictation was prepared with Dragon dictation along with smaller phrase technology. Any transcriptional errors that result from this process are unintentional.

## 2022-03-09 ENCOUNTER — Ambulatory Visit
Admission: RE | Admit: 2022-03-09 | Discharge: 2022-03-09 | Disposition: A | Discharge status: Home / Self Care | Payer: Medicare Other | Attending: Gastroenterology | Admitting: Gastroenterology

## 2022-03-09 ENCOUNTER — Ambulatory Visit: Payer: Medicare Other | Admitting: Certified Registered"

## 2022-03-09 ENCOUNTER — Encounter
Admission: RE | Disposition: A | Discharge status: Home / Self Care | Payer: Self-pay | Source: Home / Self Care | Attending: Gastroenterology

## 2022-03-09 DIAGNOSIS — I10 Essential (primary) hypertension: Secondary | ICD-10-CM | POA: Diagnosis not present

## 2022-03-09 DIAGNOSIS — Z79899 Other long term (current) drug therapy: Secondary | ICD-10-CM | POA: Diagnosis not present

## 2022-03-09 DIAGNOSIS — K30 Functional dyspepsia: Secondary | ICD-10-CM | POA: Diagnosis not present

## 2022-03-09 DIAGNOSIS — K2100 Gastro-esophageal reflux disease with esophagitis, without bleeding: Secondary | ICD-10-CM | POA: Diagnosis not present

## 2022-03-09 DIAGNOSIS — K640 First degree hemorrhoids: Secondary | ICD-10-CM | POA: Insufficient documentation

## 2022-03-09 DIAGNOSIS — G4730 Sleep apnea, unspecified: Secondary | ICD-10-CM | POA: Insufficient documentation

## 2022-03-09 DIAGNOSIS — K635 Polyp of colon: Secondary | ICD-10-CM

## 2022-03-09 DIAGNOSIS — R1013 Epigastric pain: Secondary | ICD-10-CM | POA: Diagnosis not present

## 2022-03-09 DIAGNOSIS — D124 Benign neoplasm of descending colon: Secondary | ICD-10-CM | POA: Diagnosis not present

## 2022-03-09 DIAGNOSIS — R634 Abnormal weight loss: Secondary | ICD-10-CM | POA: Insufficient documentation

## 2022-03-09 DIAGNOSIS — K2950 Unspecified chronic gastritis without bleeding: Secondary | ICD-10-CM | POA: Diagnosis not present

## 2022-03-09 DIAGNOSIS — B9681 Helicobacter pylori [H. pylori] as the cause of diseases classified elsewhere: Secondary | ICD-10-CM | POA: Insufficient documentation

## 2022-03-09 DIAGNOSIS — Z6833 Body mass index (BMI) 33.0-33.9, adult: Secondary | ICD-10-CM | POA: Diagnosis not present

## 2022-03-09 DIAGNOSIS — E109 Type 1 diabetes mellitus without complications: Secondary | ICD-10-CM | POA: Insufficient documentation

## 2022-03-09 DIAGNOSIS — R142 Eructation: Secondary | ICD-10-CM

## 2022-03-09 DIAGNOSIS — Z86010 Personal history of colonic polyps: Secondary | ICD-10-CM

## 2022-03-09 HISTORY — PX: ESOPHAGOGASTRODUODENOSCOPY: SHX5428

## 2022-03-09 HISTORY — PX: COLONOSCOPY: SHX5424

## 2022-03-09 LAB — GLUCOSE, CAPILLARY
Glucose-Capillary: 281 mg/dL — ABNORMAL HIGH (ref 70–99)
Glucose-Capillary: 277 mg/dL — ABNORMAL HIGH (ref 70–99)

## 2022-03-09 SURGERY — COLONOSCOPY
Anesthesia: General

## 2022-03-09 MED ORDER — LIDOCAINE HCL (CARDIAC) PF 100 MG/5ML IV SOSY
PREFILLED_SYRINGE | INTRAVENOUS | Status: DC | PRN
  Administered 2022-03-09: 100 mg via INTRAVENOUS

## 2022-03-09 MED ORDER — PROPOFOL 10 MG/ML IV BOLUS
INTRAVENOUS | Status: DC | PRN
  Administered 2022-03-09 (×2): 40 mg via INTRAVENOUS
  Administered 2022-03-09: 120 mg via INTRAVENOUS
  Administered 2022-03-09 (×3): 40 mg via INTRAVENOUS

## 2022-03-09 MED ORDER — PANTOPRAZOLE SODIUM 40 MG PO TBEC: 40.0000 mg | DELAYED_RELEASE_TABLET | Freq: Every day | ORAL | 11 refills | Status: DC

## 2022-03-09 MED ORDER — SODIUM CHLORIDE 0.9 % IV SOLN
INTRAVENOUS | Status: DC
  Administered 2022-03-09: 20 mL/h via INTRAVENOUS

## 2022-03-09 MED ORDER — PHENYLEPHRINE 80 MCG/ML (10ML) SYRINGE FOR IV PUSH (FOR BLOOD PRESSURE SUPPORT)
PREFILLED_SYRINGE | INTRAVENOUS | Status: DC | PRN
  Administered 2022-03-09: 80 ug via INTRAVENOUS
  Administered 2022-03-09 (×2): 160 ug via INTRAVENOUS

## 2022-03-09 NOTE — Anesthesia Postprocedure Evaluation (Signed)
Anesthesia Post Note  Patient: Corey Petersen  Procedure(s) Performed: COLONOSCOPY ESOPHAGOGASTRODUODENOSCOPY (EGD)  Patient location during evaluation: PACU Anesthesia Type: General Level of consciousness: awake and lethargic Pain management: pain level controlled Vital Signs Assessment: post-procedure vital signs reviewed and stable Respiratory status: spontaneous breathing and nonlabored ventilation Cardiovascular status: stable Anesthetic complications: no   No notable events documented.   Last Vitals:  Vitals:   03/09/22 1002 03/09/22 1012  BP: 115/80 (!) 138/91  Pulse:    Resp:    Temp:    SpO2:      Last Pain:  Vitals:   03/09/22 1012  TempSrc:   PainSc: 0-No pain                 VAN STAVEREN,Corey Petersen

## 2022-03-09 NOTE — Op Note (Signed)
[facility] Gastroenterology Patient Name: Corey Petersen Procedure Date: 03/09/2022 9:08 AM MRN: [id number] Account #: [id number] Date of Birth: 1960-12-20 Admit Type: Outpatient Age: 61 Room: [facility] ENDO ROOM 4 Gender: Male Note Status: Finalized Instrument Name: Upper Endoscope [id number] Procedure:             Upper GI endoscopy Indications:           Functional Dyspepsia, Heartburn Providers:             Lucilla Lame MD, MD Medicines:             Propofol per Anesthesia Complications:         No immediate complications. Procedure:             Pre-Anesthesia Assessment:                        - Prior to the procedure, a History and Physical was                         performed, and patient medications and allergies were                         reviewed. The patient's tolerance of previous                         anesthesia was also reviewed. The risks and benefits                         of the procedure and the sedation options and risks                         were discussed with the patient. All questions were                         answered, and informed consent was obtained. Prior                         Anticoagulants: The patient has taken no previous                         anticoagulant or antiplatelet agents. ASA Grade                         Assessment: II - A patient with mild systemic disease.                         After reviewing the risks and benefits, the patient                         was deemed in satisfactory condition to undergo the                         procedure.                        After obtaining informed consent, the endoscope was                         passed under direct vision. Throughout the procedure,  the patient's blood pressure, pulse, and oxygen                         saturations were monitored continuously. The Endoscope                         was introduced through the mouth, and advanced to the                          second part of duodenum. The upper GI endoscopy was                         accomplished without difficulty. The patient tolerated                         the procedure well. Findings:      LA Grade B (one or more mucosal breaks greater than 5 mm, not extending       between the tops of two mucosal folds) esophagitis with no bleeding was       found at the gastroesophageal junction.      The entire examined stomach was normal. Biopsies were taken with a cold       forceps for histology.      The examined duodenum was normal. Impression:            - LA Grade B reflux esophagitis with no bleeding.                        - Normal stomach. Biopsied.                        - Normal examined duodenum. Recommendation:        - Discharge patient to home.                        - Resume previous diet.                        - Continue present medications.                        - Await pathology results.                        - Perform a colonoscopy. Procedure Code(s):     --- Professional ---                        [phone], Esophagogastroduodenoscopy, flexible,                         transoral; with biopsy, single or multiple Diagnosis Code(s):     --- Professional ---                        R12, Heartburn                        K21.00, Gastro-esophageal reflux disease with                         esophagitis, without bleeding  K30, Functional dyspepsia CPT copyright 2019 American Medical Association. All rights reserved. The codes documented in this report are preliminary and upon coder review may  be revised to meet current compliance requirements. Lucilla Lame MD, MD 03/09/2022 9:27:08 AM This report has been signed electronically. Number of Addenda: 0 Note Initiated On: 03/09/2022 9:08 AM Estimated Blood Loss:  Estimated blood loss: none.      [facility]

## 2022-03-09 NOTE — Transfer of Care (Signed)
Immediate Anesthesia Transfer of Care Note  Patient: Corey Petersen  Procedure(s) Performed: COLONOSCOPY ESOPHAGOGASTRODUODENOSCOPY (EGD)  Patient Location: Endoscopy Unit  Anesthesia Type:General  Level of Consciousness: drowsy  Airway & Oxygen Therapy: Patient Spontanous Breathing and Patient connected to nasal cannula oxygen  Post-op Assessment: Report given to RN, Post -op Vital signs reviewed and stable and Patient moving all extremities  Post vital signs: Reviewed and stable  Last Vitals:  Vitals Value Taken Time  BP 81/53 03/09/22 0942  Temp    Pulse 91 03/09/22 0943  Resp 11 03/09/22 0943  SpO2 98 % 03/09/22 0943  Vitals shown include unvalidated device data.  Last Pain:  Vitals:   03/09/22 0831  TempSrc: Temporal  PainSc: 0-No pain         Complications: No notable events documented.

## 2022-03-09 NOTE — Interval H&P Note (Signed)
Lucilla Lame, MD [facility] [address] [place], [place] [address] Phone:[phone] Fax : [phone]  Primary Care Physician:  [facility] Primary Gastroenterologist:  Dr. Allen Norris  Pre-Procedure History & Physical: HPI:  Corey Petersen is a 61 y.o. male is here for an endoscopy and colonoscopy.   Past Medical History:  Diagnosis Date   Diabetes mellitus without complication ([place])    Hypertension     Past Surgical History:  Procedure Laterality Date   HERNIA REPAIR      Prior to Admission medications   Medication Sig Start Date End Date Taking? Authorizing Provider  amLODipine (NORVASC) 10 MG tablet Take 1 tablet by mouth daily. 05/12/21  Yes [provider]  atorvastatin (LIPITOR) 80 MG tablet TAKE ONE-HALF TABLET BY MOUTH AT BEDTIME FOR CHOLESTEROL 05/12/21  Yes [provider]  baclofen (LIORESAL) 10 MG tablet Take 1 tablet (10 mg total) by mouth daily as needed (Hiccups). 11/10/21 11/10/22 Yes Cuthriell, Charline Bills, PA-C  DULoxetine (CYMBALTA) 30 MG capsule TAKE ONE CAPSULE BY MOUTH AT BEDTIME FOR 7 DAYS, THEN TAKE TWO CAPSULES AT BEDTIME FOR HICCUPS EXACERBATED BY ANXIETY 08/08/21  Yes [provider]  empagliflozin (JARDIANCE) 25 MG TABS tablet TAKE ONE-HALF TABLET BY MOUTH EVERY DAY FOR TYPE 2 DIABETES MELLITUS 08/08/21  Yes [provider]  famotidine (PEPCID) 20 MG tablet Take 1 tablet (20 mg total) by mouth daily. 11/10/21 11/10/22 Yes Cuthriell, Charline Bills, PA-C  insulin glargine-yfgn (SEMGLEE) 100 UNIT/ML injection INJECT 60 UNITS UNDER SKIN EVERY DAY FOR DIABETES. DO NOT MIX WITH OTHER INSULINS IN THE SAME SYRINGE. DISCARD BOTTLE 28 DAYS AFTER OPENING. **REPLACES LANTUS** 08/08/21  Yes [provider]  losartan (COZAAR) 100 MG tablet Take 1 tablet by mouth daily. 11/22/21  Yes [provider]  metFORMIN (GLUCOPHAGE-XR) 500 MG 24 hr tablet TAKE TWO TABLETS BY MOUTH TWO TIMES A DAY FOR DIABETES NOTE DOSE INCREASE  06/14/21  Yes [provider]  omeprazole (PRILOSEC) 10 MG capsule Take 1 capsule (10 mg total) by mouth daily. 11/10/21  Yes Cuthriell, Charline Bills, PA-C  pantoprazole (PROTONIX) 20 MG tablet Take 1 tablet (20 mg total) by mouth daily. 10/19/21 10/19/22 Yes Merlyn Lot, MD    Allergies as of 02/13/2022 - Review Complete 02/13/2022  Allergen Reaction Noted   Ace inhibitors Cough 03/15/2016    No family history on file.  Social History   Socioeconomic History   Marital status: Married    Spouse name: Not on file   Number of children: Not on file   Years of education: Not on file   Highest education level: Not on file  Occupational History   Not on file  Tobacco Use   Smoking status: Never   Smokeless tobacco: Never  Vaping Use   Vaping Use: Never used  Substance and Sexual Activity   Alcohol use: Never   Drug use: Never   Sexual activity: Not on file  Other Topics Concern   Not on file  Social History Narrative   Not on file   Social Determinants of Health   Financial Resource Strain: Not on file  Food Insecurity: Not on file  Transportation Needs: Not on file  Physical Activity: Not on file  Stress: Not on file  Social Connections: Not on file  Intimate Partner Violence: Not on file    Review of Systems: See HPI, otherwise negative ROS  Physical Exam: BP 108/75   Pulse (!) 117   Temp (!) 96.4 F (  35.8 C) (Temporal)   Resp 20   Ht '5\' 7"'$  (1.702 m)   Wt 97.5 kg   SpO2 100%   BMI 33.67 kg/m  General:   Alert,  pleasant and cooperative in NAD Head:  Normocephalic and atraumatic. Neck:  Supple; no masses or thyromegaly. Lungs:  Clear throughout to auscultation.    Heart:  Regular rate and rhythm. Abdomen:  Soft, nontender and nondistended. Normal bowel sounds, without guarding, and without rebound.   Neurologic:  Alert and  oriented x4;  grossly normal neurologically.  Impression/Plan: Corey Petersen is here for an endoscopy and colonoscopy to  be performed for dyspepsia and weight loss  Risks, benefits, limitations, and alternatives regarding  endoscopy and colonoscopy have been reviewed with the patient.  Questions have been answered.  All parties agreeable.   Lucilla Lame, MD  03/09/2022, 9:07 AM

## 2022-03-09 NOTE — Op Note (Signed)
[facility] Gastroenterology Patient Name: Corey Petersen Procedure Date: 03/09/2022 9:08 AM MRN: [id number] Account #: [id number] Date of Birth: 1961-01-12 Admit Type: Outpatient Age: 61 Room: [facility] ENDO ROOM 4 Gender: Male Note Status: Finalized Instrument Name: Park Meo [id number] Procedure:             Colonoscopy Indications:           Weight loss Providers:             Lucilla Lame MD, MD Medicines:             Propofol per Anesthesia Complications:         No immediate complications. Procedure:             Pre-Anesthesia Assessment:                        - Prior to the procedure, a History and Physical was                         performed, and patient medications and allergies were                         reviewed. The patient's tolerance of previous                         anesthesia was also reviewed. The risks and benefits                         of the procedure and the sedation options and risks                         were discussed with the patient. All questions were                         answered, and informed consent was obtained. Prior                         Anticoagulants: The patient has taken no previous                         anticoagulant or antiplatelet agents. ASA Grade                         Assessment: II - A patient with mild systemic disease.                         After reviewing the risks and benefits, the patient                         was deemed in satisfactory condition to undergo the                         procedure.                        After obtaining informed consent, the colonoscope was                         passed under direct vision. Throughout the procedure,  the patient's blood pressure, pulse, and oxygen                         saturations were monitored continuously. The                         Colonoscope was introduced through the anus and                         advanced to the the  cecum, identified by appendiceal                         orifice and ileocecal valve. The colonoscopy was                         performed without difficulty. The patient tolerated                         the procedure well. The quality of the bowel                         preparation was good. Findings:      The perianal and digital rectal examinations were normal.      Three sessile polyps were found in the descending colon. The polyps were       1 to 3 mm in size. These polyps were removed with a cold biopsy forceps.       Resection and retrieval were complete.      Non-bleeding internal hemorrhoids were found during retroflexion. The       hemorrhoids were Grade I (internal hemorrhoids that do not prolapse). Impression:            - Three 1 to 3 mm polyps in the descending colon,                         removed with a cold biopsy forceps. Resected and                         retrieved.                        - Non-bleeding internal hemorrhoids. Recommendation:        - Discharge patient to home.                        - Resume previous diet.                        - Continue present medications.                        - Await pathology results.                        - If the pathology report reveals adenomatous tissue,                         then repeat the colonoscopy for surveillance in 5  years. Procedure Code(s):     --- Professional ---                        [phone], Colonoscopy, flexible; with biopsy, single or                         multiple Diagnosis Code(s):     --- Professional ---                        R63.4, Abnormal weight loss                        K63.5, Polyp of colon CPT copyright 2019 American Medical Association. All rights reserved. The codes documented in this report are preliminary and upon coder review may  be revised to meet current compliance requirements. Lucilla Lame MD, MD 03/09/2022 9:41:19 AM This report has been signed  electronically. Number of Addenda: 0 Note Initiated On: 03/09/2022 9:08 AM Scope Withdrawal Time: 0 hours 7 minutes 22 seconds  Total Procedure Duration: 0 hours 9 minutes 18 seconds  Estimated Blood Loss:  Estimated blood loss: none.      [facility]

## 2022-03-09 NOTE — Anesthesia Preprocedure Evaluation (Signed)
Anesthesia Evaluation  Patient identified by MRN, date of birth, ID band Patient awake    Reviewed: Allergy & Precautions, NPO status , Patient's Chart, lab work & pertinent test results  Airway Mallampati: II  TM Distance: >3 FB Neck ROM: Full    Dental  (+) Teeth Intact   Pulmonary neg pulmonary ROS, sleep apnea ,    Pulmonary exam normal  + decreased breath sounds      Cardiovascular Exercise Tolerance: Good hypertension, Pt. on medications negative cardio ROS Normal cardiovascular exam Rhythm:Regular     Neuro/Psych negative neurological ROS  negative psych ROS   GI/Hepatic negative GI ROS, Neg liver ROS,   Endo/Other  negative endocrine ROSdiabetes, Well Controlled, Type 1Morbid obesity  Renal/GU negative Renal ROS  negative genitourinary   Musculoskeletal   Abdominal (+) + obese,   Peds negative pediatric ROS (+)  Hematology negative hematology ROS (+)   Anesthesia Other Findings Past Medical History: No date: Diabetes mellitus without complication (HCC) No date: Hypertension  Past Surgical History: No date: HERNIA REPAIR  BMI    Body Mass Index: 33.67 kg/m      Reproductive/Obstetrics negative OB ROS                             Anesthesia Physical Anesthesia Plan  ASA: 3  Anesthesia Plan: General   Post-op Pain Management:    Induction: Intravenous  PONV Risk Score and Plan: Propofol infusion and TIVA  Airway Management Planned: Natural Airway  Additional Equipment:   Intra-op Plan:   Post-operative Plan:   Informed Consent: I have reviewed the patients History and Physical, chart, labs and discussed the procedure including the risks, benefits and alternatives for the proposed anesthesia with the patient or authorized representative who has indicated his/her understanding and acceptance.     Dental Advisory Given  Plan Discussed with: CRNA and  Surgeon  Anesthesia Plan Comments:         Anesthesia Quick Evaluation

## 2022-03-12 ENCOUNTER — Encounter: Payer: Self-pay | Admitting: Gastroenterology

## 2022-03-12 LAB — SURGICAL PATHOLOGY

## 2022-03-14 ENCOUNTER — Telehealth: Payer: Self-pay

## 2022-03-14 DIAGNOSIS — Z8619 Personal history of other infectious and parasitic diseases: Secondary | ICD-10-CM

## 2022-03-14 MED ORDER — PANTOPRAZOLE SODIUM 20 MG PO TBEC: 20.0000 mg | DELAYED_RELEASE_TABLET | Freq: Two times a day (BID) | ORAL | 0 refills | Status: DC

## 2022-03-14 MED ORDER — CLARITHROMYCIN 500 MG PO TABS: 500.0000 mg | ORAL_TABLET | Freq: Two times a day (BID) | ORAL | 0 refills | Status: DC

## 2022-03-14 MED ORDER — AMOXICILLIN 500 MG PO CAPS: 1000.0000 mg | ORAL_CAPSULE | Freq: Two times a day (BID) | ORAL | 0 refills | Status: DC

## 2022-03-14 NOTE — Telephone Encounter (Signed)
Pt is aware of results and expressed understanding of H pylori treatment and f/u lab instructions....   Rx sent through [facility] Lab future ordered

## 2022-04-12 ENCOUNTER — Other Ambulatory Visit: Payer: Self-pay

## 2022-07-08 ENCOUNTER — Encounter: Payer: Self-pay | Admitting: Emergency Medicine

## 2022-07-08 ENCOUNTER — Inpatient Hospital Stay
Admission: EM | Admit: 2022-07-08 | Discharge: 2022-07-10 | DRG: 638 | Disposition: A | Discharge status: Home / Self Care | Payer: No Typology Code available for payment source | Attending: Internal Medicine | Admitting: Internal Medicine

## 2022-07-08 ENCOUNTER — Inpatient Hospital Stay: Payer: No Typology Code available for payment source

## 2022-07-08 ENCOUNTER — Emergency Department: Payer: No Typology Code available for payment source

## 2022-07-08 ENCOUNTER — Other Ambulatory Visit: Payer: Self-pay

## 2022-07-08 DIAGNOSIS — E1110 Type 2 diabetes mellitus with ketoacidosis without coma: Principal | ICD-10-CM | POA: Diagnosis present

## 2022-07-08 DIAGNOSIS — F419 Anxiety disorder, unspecified: Secondary | ICD-10-CM | POA: Diagnosis present

## 2022-07-08 DIAGNOSIS — Z6836 Body mass index (BMI) 36.0-36.9, adult: Secondary | ICD-10-CM

## 2022-07-08 DIAGNOSIS — A084 Viral intestinal infection, unspecified: Secondary | ICD-10-CM | POA: Diagnosis present

## 2022-07-08 DIAGNOSIS — R6510 Systemic inflammatory response syndrome (SIRS) of non-infectious origin without acute organ dysfunction: Secondary | ICD-10-CM | POA: Diagnosis present

## 2022-07-08 DIAGNOSIS — R112 Nausea with vomiting, unspecified: Secondary | ICD-10-CM

## 2022-07-08 DIAGNOSIS — Z8249 Family history of ischemic heart disease and other diseases of the circulatory system: Secondary | ICD-10-CM

## 2022-07-08 DIAGNOSIS — R6520 Severe sepsis without septic shock: Secondary | ICD-10-CM

## 2022-07-08 DIAGNOSIS — R739 Hyperglycemia, unspecified: Principal | ICD-10-CM

## 2022-07-08 DIAGNOSIS — E876 Hypokalemia: Secondary | ICD-10-CM | POA: Diagnosis present

## 2022-07-08 DIAGNOSIS — R748 Abnormal levels of other serum enzymes: Secondary | ICD-10-CM | POA: Diagnosis present

## 2022-07-08 DIAGNOSIS — I10 Essential (primary) hypertension: Secondary | ICD-10-CM | POA: Diagnosis present

## 2022-07-08 DIAGNOSIS — A419 Sepsis, unspecified organism: Secondary | ICD-10-CM | POA: Diagnosis present

## 2022-07-08 DIAGNOSIS — R7401 Elevation of levels of liver transaminase levels: Secondary | ICD-10-CM | POA: Diagnosis present

## 2022-07-08 DIAGNOSIS — R142 Eructation: Secondary | ICD-10-CM

## 2022-07-08 DIAGNOSIS — R9431 Abnormal electrocardiogram [ECG] [EKG]: Secondary | ICD-10-CM | POA: Diagnosis present

## 2022-07-08 DIAGNOSIS — K3184 Gastroparesis: Secondary | ICD-10-CM | POA: Diagnosis present

## 2022-07-08 DIAGNOSIS — E1143 Type 2 diabetes mellitus with diabetic autonomic (poly)neuropathy: Secondary | ICD-10-CM | POA: Diagnosis present

## 2022-07-08 DIAGNOSIS — E669 Obesity, unspecified: Secondary | ICD-10-CM | POA: Diagnosis present

## 2022-07-08 DIAGNOSIS — E785 Hyperlipidemia, unspecified: Secondary | ICD-10-CM | POA: Diagnosis present

## 2022-07-08 DIAGNOSIS — Z1152 Encounter for screening for COVID-19: Secondary | ICD-10-CM

## 2022-07-08 DIAGNOSIS — R300 Dysuria: Secondary | ICD-10-CM | POA: Diagnosis present

## 2022-07-08 DIAGNOSIS — Z7984 Long term (current) use of oral hypoglycemic drugs: Secondary | ICD-10-CM | POA: Diagnosis not present

## 2022-07-08 DIAGNOSIS — Z79899 Other long term (current) drug therapy: Secondary | ICD-10-CM

## 2022-07-08 DIAGNOSIS — K8020 Calculus of gallbladder without cholecystitis without obstruction: Secondary | ICD-10-CM | POA: Diagnosis present

## 2022-07-08 DIAGNOSIS — Z794 Long term (current) use of insulin: Secondary | ICD-10-CM

## 2022-07-08 DIAGNOSIS — E119 Type 2 diabetes mellitus without complications: Secondary | ICD-10-CM

## 2022-07-08 DIAGNOSIS — F32A Depression, unspecified: Secondary | ICD-10-CM | POA: Diagnosis present

## 2022-07-08 DIAGNOSIS — E1169 Type 2 diabetes mellitus with other specified complication: Secondary | ICD-10-CM | POA: Diagnosis present

## 2022-07-08 DIAGNOSIS — I152 Hypertension secondary to endocrine disorders: Secondary | ICD-10-CM | POA: Diagnosis present

## 2022-07-08 DIAGNOSIS — G4730 Sleep apnea, unspecified: Secondary | ICD-10-CM | POA: Diagnosis present

## 2022-07-08 DIAGNOSIS — E1165 Type 2 diabetes mellitus with hyperglycemia: Secondary | ICD-10-CM | POA: Diagnosis present

## 2022-07-08 LAB — URINALYSIS, ROUTINE W REFLEX MICROSCOPIC
Bacteria, UA: NONE SEEN
Bilirubin Urine: NEGATIVE
Glucose, UA: >=500 mg/dL — AB
Hgb urine dipstick: NEGATIVE
Ketones, ur: NEGATIVE mg/dL
Leukocytes,Ua: NEGATIVE
Nitrite: NEGATIVE
Protein, ur: NEGATIVE mg/dL
Specific Gravity, Urine: >1.046 — ABNORMAL HIGH (ref 1.005–1.030)
Squamous Epithelial / HPF: NONE SEEN (ref 0–5)
pH: 6 (ref 5.0–8.0)

## 2022-07-08 LAB — BASIC METABOLIC PANEL
Anion gap: 19 — ABNORMAL HIGH (ref 5–15)
Anion gap: 10 (ref 5–15)
BUN: 15 mg/dL (ref 8–23)
BUN: 13 mg/dL (ref 8–23)
CO2: 21 mmol/L — ABNORMAL LOW (ref 22–32)
CO2: 26 mmol/L (ref 22–32)
Calcium: 8 mg/dL — ABNORMAL LOW (ref 8.9–10.3)
Calcium: 7.6 mg/dL — ABNORMAL LOW (ref 8.9–10.3)
Chloride: 93 mmol/L — ABNORMAL LOW (ref 98–111)
Chloride: 102 mmol/L (ref 98–111)
Creatinine, Ser: 0.98 mg/dL (ref 0.61–1.24)
Creatinine, Ser: 0.79 mg/dL (ref 0.61–1.24)
GFR, Estimated: >60 mL/min (ref >60)
GFR, Estimated: >60 mL/min (ref >60)
Glucose, Bld: 546 mg/dL (ref 70–99)
Glucose, Bld: 170 mg/dL — ABNORMAL HIGH (ref 70–99)
Potassium: 3.7 mmol/L (ref 3.5–5.1)
Potassium: 3.4 mmol/L — ABNORMAL LOW (ref 3.5–5.1)
Sodium: 133 mmol/L — ABNORMAL LOW (ref 135–145)
Sodium: 138 mmol/L (ref 135–145)

## 2022-07-08 LAB — CBC
HCT: 46.6 % (ref 39.0–52.0)
Hemoglobin: 17.1 g/dL — ABNORMAL HIGH (ref 13.0–17.0)
MCH: 33.6 pg (ref 26.0–34.0)
MCHC: 36.7 g/dL — ABNORMAL HIGH (ref 30.0–36.0)
MCV: 91.6 fL (ref 80.0–100.0)
Platelets: 356 10*3/uL (ref 150–400)
RBC: 5.09 MIL/uL (ref 4.22–5.81)
RDW: 11.9 % (ref 11.5–15.5)
WBC: 16.1 10*3/uL — ABNORMAL HIGH (ref 4.0–10.5)
nRBC: 0 % (ref 0.0–0.2)

## 2022-07-08 LAB — HEPATIC FUNCTION PANEL
ALT: 17 U/L (ref 0–44)
AST: 29 U/L (ref 15–41)
Albumin: 2.8 g/dL — ABNORMAL LOW (ref 3.5–5.0)
Alkaline Phosphatase: 143 U/L — ABNORMAL HIGH (ref 38–126)
Bilirubin, Direct: 0.4 mg/dL — ABNORMAL HIGH (ref 0.0–0.2)
Indirect Bilirubin: 1.2 mg/dL — ABNORMAL HIGH (ref 0.3–0.9)
Total Bilirubin: 1.6 mg/dL — ABNORMAL HIGH (ref 0.3–1.2)
Total Protein: 6.6 g/dL (ref 6.5–8.1)

## 2022-07-08 LAB — RESP PANEL BY RT-PCR (FLU A&B, COVID) ARPGX2
Influenza A by PCR: NEGATIVE
Influenza B by PCR: NEGATIVE
SARS Coronavirus 2 by RT PCR: NEGATIVE

## 2022-07-08 LAB — BLOOD GAS, VENOUS
Acid-Base Excess: 1.9 mmol/L (ref 0.0–2.0)
Bicarbonate: 26.6 mmol/L (ref 20.0–28.0)
O2 Saturation: 88.4 %
Patient temperature: 37
pCO2, Ven: 41 mmHg — ABNORMAL LOW (ref 44–60)
pH, Ven: 7.42 (ref 7.25–7.43)
pO2, Ven: 56 mmHg — ABNORMAL HIGH (ref 32–45)

## 2022-07-08 LAB — TROPONIN I (HIGH SENSITIVITY)
Troponin I (High Sensitivity): 36 ng/L — ABNORMAL HIGH (ref <18)
Troponin I (High Sensitivity): 36 ng/L — ABNORMAL HIGH (ref <18)

## 2022-07-08 LAB — BETA-HYDROXYBUTYRIC ACID: Beta-Hydroxybutyric Acid: 0.4 mmol/L — ABNORMAL HIGH (ref 0.05–0.27)

## 2022-07-08 LAB — CBG MONITORING, ED
Glucose-Capillary: 590 mg/dL (ref 70–99)
Glucose-Capillary: 303 mg/dL — ABNORMAL HIGH (ref 70–99)
Glucose-Capillary: 200 mg/dL — ABNORMAL HIGH (ref 70–99)
Glucose-Capillary: 126 mg/dL — ABNORMAL HIGH (ref 70–99)
Glucose-Capillary: 124 mg/dL — ABNORMAL HIGH (ref 70–99)
Glucose-Capillary: 140 mg/dL — ABNORMAL HIGH (ref 70–99)

## 2022-07-08 LAB — LACTIC ACID, PLASMA
Lactic Acid, Venous: 4.5 mmol/L (ref 0.5–1.9)
Lactic Acid, Venous: 5.9 mmol/L (ref 0.5–1.9)
Lactic Acid, Venous: 5.3 mmol/L (ref 0.5–1.9)
Lactic Acid, Venous: 3.3 mmol/L (ref 0.5–1.9)

## 2022-07-08 LAB — LIPASE, BLOOD: Lipase: 28 U/L (ref 11–51)

## 2022-07-08 LAB — PROCALCITONIN: Procalcitonin: <0.1 ng/mL

## 2022-07-08 MED ORDER — ENOXAPARIN SODIUM 40 MG/0.4ML IJ SOSY
40.0000 mg | PREFILLED_SYRINGE | INTRAMUSCULAR | Status: DC
  Administered 2022-07-08 – 2022-07-09 (×2): 40 mg via SUBCUTANEOUS
  Filled 2022-07-08 (×2): qty 0.4

## 2022-07-08 MED ORDER — DEXTROSE 50 % IV SOLN: 0.0000 mL | INTRAVENOUS | Status: DC | PRN

## 2022-07-08 MED ORDER — DROPERIDOL 2.5 MG/ML IJ SOLN: 2.5000 mg | Freq: Once | INTRAMUSCULAR | Status: DC

## 2022-07-08 MED ORDER — SODIUM CHLORIDE 0.9 % IV SOLN
2.0000 g | Freq: Once | INTRAVENOUS | Status: DC
  Filled 2022-07-08: qty 12.5

## 2022-07-08 MED ORDER — IOHEXOL 300 MG/ML  SOLN
100.0000 mL | Freq: Once | INTRAMUSCULAR | Status: AC | PRN
  Administered 2022-07-08: 100 mL via INTRAVENOUS

## 2022-07-08 MED ORDER — METRONIDAZOLE 500 MG/100ML IV SOLN: 500.0000 mg | Freq: Once | INTRAVENOUS | Status: DC

## 2022-07-08 MED ORDER — SODIUM CHLORIDE 0.9 % IV BOLUS
1000.0000 mL | Freq: Once | INTRAVENOUS | Status: AC
  Administered 2022-07-08: 1000 mL via INTRAVENOUS

## 2022-07-08 MED ORDER — ONDANSETRON HCL 4 MG PO TABS: 4.0000 mg | ORAL_TABLET | Freq: Four times a day (QID) | ORAL | Status: DC | PRN

## 2022-07-08 MED ORDER — AMLODIPINE BESYLATE 10 MG PO TABS
10.0000 mg | ORAL_TABLET | Freq: Every day | ORAL | Status: DC
  Administered 2022-07-09 – 2022-07-10 (×2): 10 mg via ORAL
  Filled 2022-07-08: qty 2
  Filled 2022-07-08: qty 1

## 2022-07-08 MED ORDER — SODIUM CHLORIDE 0.9 % IV SOLN
2.0000 g | Freq: Three times a day (TID) | INTRAVENOUS | Status: DC
  Administered 2022-07-08 – 2022-07-09 (×3): 2 g via INTRAVENOUS
  Filled 2022-07-08 (×2): qty 12.5

## 2022-07-08 MED ORDER — DULOXETINE HCL 30 MG PO CPEP
30.0000 mg | ORAL_CAPSULE | Freq: Every day | ORAL | Status: DC
  Administered 2022-07-08 – 2022-07-09 (×2): 30 mg via ORAL
  Filled 2022-07-08 (×3): qty 1

## 2022-07-08 MED ORDER — ONDANSETRON HCL 4 MG/2ML IJ SOLN
4.0000 mg | Freq: Once | INTRAMUSCULAR | Status: AC
  Administered 2022-07-08: 4 mg via INTRAVENOUS
  Filled 2022-07-08: qty 2

## 2022-07-08 MED ORDER — PANTOPRAZOLE SODIUM 40 MG IV SOLR
40.0000 mg | Freq: Once | INTRAVENOUS | Status: AC
  Administered 2022-07-08: 40 mg via INTRAVENOUS
  Filled 2022-07-08: qty 10

## 2022-07-08 MED ORDER — SENNOSIDES-DOCUSATE SODIUM 8.6-50 MG PO TABS: 1.0000 | ORAL_TABLET | Freq: Every evening | ORAL | Status: DC | PRN

## 2022-07-08 MED ORDER — ACETAMINOPHEN 650 MG RE SUPP: 650.0000 mg | Freq: Four times a day (QID) | RECTAL | Status: DC | PRN

## 2022-07-08 MED ORDER — VANCOMYCIN HCL IN DEXTROSE 1-5 GM/200ML-% IV SOLN
1000.0000 mg | Freq: Once | INTRAVENOUS | Status: AC
  Administered 2022-07-08: 1000 mg via INTRAVENOUS
  Filled 2022-07-08: qty 200

## 2022-07-08 MED ORDER — HALOPERIDOL LACTATE 5 MG/ML IJ SOLN
5.0000 mg | Freq: Four times a day (QID) | INTRAMUSCULAR | Status: DC | PRN
  Administered 2022-07-08: 5 mg via INTRAVENOUS
  Filled 2022-07-08 (×2): qty 1

## 2022-07-08 MED ORDER — DEXTROSE IN LACTATED RINGERS 5 % IV SOLN: INTRAVENOUS | Status: DC

## 2022-07-08 MED ORDER — ACETAMINOPHEN 325 MG PO TABS: 650.0000 mg | ORAL_TABLET | Freq: Four times a day (QID) | ORAL | Status: DC | PRN

## 2022-07-08 MED ORDER — SODIUM CHLORIDE 0.9 % IV BOLUS: 1000.0000 mL | Freq: Once | INTRAVENOUS | Status: DC

## 2022-07-08 MED ORDER — METRONIDAZOLE 500 MG/100ML IV SOLN
500.0000 mg | Freq: Two times a day (BID) | INTRAVENOUS | Status: DC
  Administered 2022-07-08 – 2022-07-09 (×2): 500 mg via INTRAVENOUS
  Filled 2022-07-08 (×2): qty 100

## 2022-07-08 MED ORDER — LACTATED RINGERS IV BOLUS (SEPSIS)
1000.0000 mL | Freq: Once | INTRAVENOUS | Status: AC
  Administered 2022-07-08: 1000 mL via INTRAVENOUS

## 2022-07-08 MED ORDER — LOSARTAN POTASSIUM 50 MG PO TABS
100.0000 mg | ORAL_TABLET | Freq: Every day | ORAL | Status: DC
  Administered 2022-07-09 – 2022-07-10 (×2): 100 mg via ORAL
  Filled 2022-07-08 (×2): qty 2

## 2022-07-08 MED ORDER — VANCOMYCIN HCL IN DEXTROSE 1-5 GM/200ML-% IV SOLN: 1000.0000 mg | Freq: Once | INTRAVENOUS | Status: DC

## 2022-07-08 MED ORDER — MELATONIN 5 MG PO TABS
5.0000 mg | ORAL_TABLET | Freq: Every evening | ORAL | Status: DC | PRN
  Administered 2022-07-09: 5 mg via ORAL
  Filled 2022-07-08: qty 1

## 2022-07-08 MED ORDER — ONDANSETRON HCL 4 MG/2ML IJ SOLN: 4.0000 mg | Freq: Four times a day (QID) | INTRAMUSCULAR | Status: DC | PRN

## 2022-07-08 MED ORDER — METOPROLOL TARTRATE 5 MG/5ML IV SOLN: 2.5000 mg | INTRAVENOUS | Status: DC | PRN

## 2022-07-08 MED ORDER — VANCOMYCIN HCL 1750 MG/350ML IV SOLN: 1750.0000 mg | INTRAVENOUS | Status: DC

## 2022-07-08 MED ORDER — INSULIN REGULAR(HUMAN) IN NACL 100-0.9 UT/100ML-% IV SOLN
INTRAVENOUS | Status: DC
  Administered 2022-07-08: 19 [IU]/h via INTRAVENOUS
  Filled 2022-07-08: qty 100

## 2022-07-08 MED ORDER — LACTATED RINGERS IV SOLN: INTRAVENOUS | Status: DC

## 2022-07-08 MED ORDER — PANTOPRAZOLE 80MG IVPB - SIMPLE MED
80.0000 mg | Freq: Two times a day (BID) | INTRAVENOUS | Status: DC
  Administered 2022-07-08: 80 mg via INTRAVENOUS
  Filled 2022-07-08 (×2): qty 100

## 2022-07-08 MED ORDER — LABETALOL HCL 5 MG/ML IV SOLN: 5.0000 mg | INTRAVENOUS | Status: DC | PRN

## 2022-07-08 MED ORDER — INSULIN ASPART 100 UNIT/ML IJ SOLN
10.0000 [IU] | Freq: Once | INTRAMUSCULAR | Status: AC
  Administered 2022-07-08: 10 [IU] via INTRAVENOUS
  Filled 2022-07-08: qty 1

## 2022-07-08 MED ORDER — VANCOMYCIN HCL 1500 MG/300ML IV SOLN
1500.0000 mg | Freq: Once | INTRAVENOUS | Status: AC
  Administered 2022-07-08: 1500 mg via INTRAVENOUS
  Filled 2022-07-08: qty 300

## 2022-07-08 NOTE — Sepsis Progress Note (Signed)
Sepsis protocol is being followed by [facility]. 

## 2022-07-08 NOTE — Hospital Course (Addendum)
[name] is a 61 year old male with history of insulin-dependent diabetes mellitus, hyperlipidemia, hypertension, esophagitis, depression, anxiety, who presents emergency department for chief concerns of hyperglycemia and weakness.  Initial vitals in the emergency department showed temperature of 98.3, respiration rate of 18, heart rate of 132, blood pressure 154/103, SpO2 of 96% on room air.  Serum sodium is 133, potassium 3.7, chloride 93, bicarb 21, BUN of 15, serum creatinine of 0.98, eGFR greater than 60, nonfasting blood glucose 546, WBC 16.1, hemoglobin 17.1, platelets of 356.  Lactic acid is 4.5.  Anion gap was elevated at 19.  Alk phos was elevated at 143.  High since troponin was 36 and on repeat was 36.  ED treatment: Insulin aspart 10 units one-time dose, cefepime IV, metronidazole, vancomycin, sodium chloride 2 L bolus, Protonix 40 mg IV.  For concern of DKA with elevated beta apoptotic acid, he was started on Endo tool which was later transitioned to basal and SSI.  CT abdomen and pelvis with cholelithiasis, no cholecystitis or colitis.  Concern of mild esophagitis, most likely with nausea and vomiting.  [place]  RUQ also with cholelithiasis only. Chest x-ray with no acute abnormality.  UA negative.  Procalcitonin negative.  12/4: Blood pressure mildly elevated at 158/80.  DKA has been resolved.  Hypokalemia with potassium of 2.8 which is being repleted.  Procalcitonin remain negative so antibiotics are being discontinued.  Preliminary blood cultures negative, urine cultures pending but UA does not look infected. Patient with a lot of belching which is going on for a while.  Concern of gastroparesis which need an outpatient GI workup.  No more nausea or vomiting. Started on diet.  Also starting him on Reglan and Tums with food.  Protonix infusion was discontinued and he was started on twice daily Protonix p.o.  12/5: Patient with improvement in his belching after starting  Reglan.  He might be having gastroparesis which need to be investigated by gastroenterology as an outpatient for further management.  He will continue with twice daily Protonix and can also use Tums as needed for excessive belching.  A new prescription of baclofen was also given to be used as needed for hiccup.  Patient should be using small frequent meals to help.  He is being discharged to continue his home medications, only addition of was Reglan.  Patient need to follow-up closely with his providers for further recommendations.

## 2022-07-08 NOTE — ED Notes (Signed)
CBG 200

## 2022-07-08 NOTE — Code Documentation (Addendum)
CODE SEPSIS - PHARMACY COMMUNICATION  **Broad Spectrum Antibiotics should be administered within 1 hour of Sepsis diagnosis**  Time Code Sepsis Called/Page Received: 1557  Antibiotics Ordered: cefepime, vancomycin  Time of 1st antibiotic administration: [place] ,PharmD Clinical Pharmacist  07/08/2022  4:47 PM

## 2022-07-08 NOTE — Assessment & Plan Note (Signed)
Concern of gastroparesis with diabetes. -Reglan 5 mg 3 times daily with meals -Tums with meals -Twice daily Protonix -Will get benefit from outpatient GI evaluation

## 2022-07-08 NOTE — Assessment & Plan Note (Addendum)
-   Patient is meeting SIRS criteria with elevated heart rate, leukocytosis of 16 point come, lactic acid of 4.5, organ involvement is endocrine. Sepsis currently ruled out as there is no source of infection.  SIRS response with DKA.  Pending urine culture, but urine does not look infected.  Preliminary blood cultures negative.  Might have some viral gastritis which is improving. -Discontinuing antibiotics -Continue with supportive care

## 2022-07-08 NOTE — ED Triage Notes (Signed)
Patient to ED via [facility] from home for hyperglycemia and weakness. Patient has had decreased PO intake x3 days.Cbg 567 with EMS- pt sis not take insulin yesterday or today. Patient noted to have excessive burping and yelling- ongoing for past year and seen for same. Patient did have fall last night- per patient no injurys/complaints from fall.  20g L Hand

## 2022-07-08 NOTE — Assessment & Plan Note (Addendum)
Alkaline phosphatase has been normalized.  T. bili still at 2. -Continue with IV fluid

## 2022-07-08 NOTE — ED Notes (Signed)
CBG 126  

## 2022-07-08 NOTE — ED Provider Notes (Signed)
Hyperglycemia and not tolerating PO.  Labs for eval for DKA. IVF bolus.   Multiple IV fluid boluses.  Hyperglycemia but does not meet criteria for DKA.  Leukocytosis.  Cholelithiasis but no signs of acute cholecystitis.  Significantly elevated lactic acid of 4.5.  Received IV antibiotics.  Admission to the hospitalist   Nathaniel Man, MD 07/08/22 1610

## 2022-07-08 NOTE — Assessment & Plan Note (Signed)
Blood pressure mildly elevated. -Continue home amlodipine and losartan. -Continue home metoprolol-restarted today

## 2022-07-08 NOTE — ED Notes (Signed)
CBG 124 

## 2022-07-08 NOTE — ED Provider Notes (Addendum)
[facility] Provider Note    Event Date/Time   First MD Initiated Contact with Patient 07/08/22 1339     (approximate)   History   Hyperglycemia   HPI  Corey Petersen is a 61 y.o. male history of diabetes presents to the ER for evaluation of nausea vomiting epigastric pain for the past 3 days not keeping anything down.  Did not have any insulin over the past 24 hours due to tolerating p.o.  Denies any chest pain or shortness of breath.     Physical Exam   Triage Vital Signs: ED Triage Vitals  Enc Vitals Group     BP 07/08/22 1139 (!) 154/103     Pulse Rate 07/08/22 1139 (!) 132     Resp 07/08/22 1139 18     Temp 07/08/22 1139 98.3 F (36.8 C)     Temp Source 07/08/22 1139 Oral     SpO2 07/08/22 1139 98 %     Weight --      Height --      Head Circumference --      Peak Flow --      Pain Score 07/08/22 1135 0     Pain Loc --      Pain Edu? --      Excl. in [place]? --     Most recent vital signs: Vitals:   07/08/22 1139 07/08/22 1456  BP: (!) 154/103   Pulse: (!) 132 (!) 126  Resp: 18 20  Temp: 98.3 F (36.8 C) 98.3 F (36.8 C)  SpO2: 98% 98%     Constitutional: Alert, ill appearing, diaphoretic Eyes: Conjunctivae are normal.  Head: Atraumatic. Nose: No congestion/rhinnorhea. Mouth/Throat: Mucous membranes are moist.   Neck: Painless ROM.  Cardiovascular:   Good peripheral circulation. Respiratory: Normal respiratory effort.  No retractions.  Gastrointestinal: Soft with epigastric ttp Musculoskeletal:  no deformity Neurologic:  MAE spontaneously. No gross focal neurologic deficits are appreciated.  Skin:  Skin is warm, dry and intact. No rash noted. Psychiatric: Mood and affect are normal. Speech and behavior are normal.    ED Results / Procedures / Treatments   Labs (all labs ordered are listed, but only abnormal results are displayed) Labs Reviewed  BASIC METABOLIC PANEL - Abnormal; Notable for the following components:       Result Value   Sodium 133 (*)    Chloride 93 (*)    CO2 21 (*)    Glucose, Bld 546 (*)    Calcium 8.0 (*)    Anion gap 19 (*)    All other components within normal limits  CBC - Abnormal; Notable for the following components:   WBC 16.1 (*)    Hemoglobin 17.1 (*)    MCHC 36.7 (*)    All other components within normal limits  BLOOD GAS, VENOUS - Abnormal; Notable for the following components:   pCO2, Ven 41 (*)    pO2, Ven 56 (*)    All other components within normal limits  LACTIC ACID, PLASMA - Abnormal; Notable for the following components:   Lactic Acid, Venous 4.5 (*)    All other components within normal limits  HEPATIC FUNCTION PANEL - Abnormal; Notable for the following components:   Albumin 2.8 (*)    Alkaline Phosphatase 143 (*)    Total Bilirubin 1.6 (*)    Bilirubin, Direct 0.4 (*)    Indirect Bilirubin 1.2 (*)    All other components within normal limits  TROPONIN I (  HIGH SENSITIVITY) - Abnormal; Notable for the following components:   Troponin I (High Sensitivity) 36 (*)    All other components within normal limits  CULTURE, BLOOD (ROUTINE X 2)  CULTURE, BLOOD (ROUTINE X 2)  LIPASE, BLOOD  URINALYSIS, ROUTINE W REFLEX MICROSCOPIC  LACTIC ACID, PLASMA  CBG MONITORING, ED  CBG MONITORING, ED  TROPONIN I (HIGH SENSITIVITY)     EKG  ED ECG REPORT I, Merlyn Lot, the attending physician, personally viewed and interpreted this ECG.   Date: 07/08/2022  EKG Time: 15:37  Rate: 120  Rhythm: sinus  Axis: normal  Intervals: normal  ST&T Change: no stemi, nonspecific st abn    RADIOLOGY Please see ED Course for my review and interpretation.  I personally reviewed all radiographic images ordered to evaluate for the above acute complaints and reviewed radiology reports and findings.  These findings were personally discussed with the patient.  Please see medical record for radiology report.    PROCEDURES:  Critical Care performed:  No  .Critical Care  Performed by: Merlyn Lot, MD Authorized by: Merlyn Lot, MD   Critical care provider statement:    Critical care time (minutes):  45   Critical care was necessary to treat or prevent imminent or life-threatening deterioration of the following conditions:  Sepsis   Critical care was time spent personally by me on the following activities:  Ordering and performing treatments and interventions, ordering and review of laboratory studies, ordering and review of radiographic studies, pulse oximetry, re-evaluation of patient's condition, review of old charts, obtaining history from patient or surrogate, examination of patient, evaluation of patient's response to treatment, discussions with primary provider, discussions with consultants and development of treatment plan with patient or surrogate    MEDICATIONS ORDERED IN ED: Medications  ceFEPIme (MAXIPIME) 2 g in sodium chloride 0.9 % 100 mL IVPB (has no administration in time range)  metroNIDAZOLE (FLAGYL) IVPB 500 mg (has no administration in time range)  vancomycin (VANCOCIN) IVPB 1000 mg/200 mL premix (has no administration in time range)  sodium chloride 0.9 % bolus 1,000 mL (0 mLs Intravenous Stopped 07/08/22 1452)  sodium chloride 0.9 % bolus 1,000 mL (1,000 mLs Intravenous New Bag/Given 07/08/22 1454)  insulin aspart (novoLOG) injection 10 Units (10 Units Intravenous Given 07/08/22 1451)  pantoprazole (PROTONIX) injection 40 mg (40 mg Intravenous Given 07/08/22 1449)  iohexol (OMNIPAQUE) 300 MG/ML solution 100 mL (100 mLs Intravenous Contrast Given 07/08/22 1424)  ondansetron (ZOFRAN) injection 4 mg (4 mg Intravenous Given 07/08/22 1557)     IMPRESSION / MDM / [place] / ED COURSE  I reviewed the triage vital signs and the nursing notes.                              Differential diagnosis includes, but is not limited to, DKA, HHS, esophagitis, perforation, SBO, colitis, noncompliance, UTI,  sepsis  Patient presenting to the ER for evaluation of symptoms as described above.  Based on symptoms, risk factors and considered above differential, this presenting complaint could reflect a potentially life-threatening illness therefore the patient will be placed on continuous pulse oximetry and telemetry for monitoring.  Laboratory evaluation will be sent to evaluate for the above complaints.     Clinical Course as of 07/08/22 1557  Sun Jul 08, 2022  1453 CT imaging my review and interpretation without evidence of perforation or obstruction.  Will await formal radiology report.  Patient be signed  out to oncoming physician pending follow-up labs and imaging for disposition. [PR]  1506 Hyperglycemia and not tolerating PO.  Labs for eval for DKA. IVF bolus.  [SM]  8144 VBG without evidence of acidemia.  Still waiting urinalysis.  Patient will receive additional IV fluids.  Still very feeling very nauseated.  Given his leukocytosis tachycardia now with resulting lactate of 4 will order broad-spectrum antibiotics.  Patient still has not provided urine sample.  As he does have cholelithiasis have added on right upper quadrant ultrasound as he is complaining of epigastric pain.  Will consult hospitalist for admission.  [PR]    Clinical Course User Index [PR] Merlyn Lot, MD [SM] Nathaniel Man, MD     FINAL CLINICAL IMPRESSION(S) / ED DIAGNOSES   Final diagnoses:  Hyperglycemia  Nausea and vomiting, unspecified vomiting type     Rx / DC Orders   ED Discharge Orders     None        Note:  This document was prepared using Dragon voice recognition software and may include unintentional dictation errors.      Merlyn Lot, MD 07/08/22 [phone]

## 2022-07-08 NOTE — ED Notes (Signed)
Critical latic - 4.5

## 2022-07-08 NOTE — Consult Note (Signed)
Pharmacy Antibiotic Note  Corey Petersen is a 61 y.o. male admitted on 07/08/2022 with sepsis.  Pharmacy has been consulted for vancomycin and cefepime dosing.   Plan: Give cefepime 2g IV every 8 hours Give Vancomycin '2500mg'$  IV x1, followed by vancomycin '1750mg'$  IV every 24 hours Goal AUC 400-550  Est AUC: 522.9 Est Cmax: 43.0 Est Cmin: 10.3 Calculated with SCr 0.98 mg/dL   Weight: 103 kg (227 lb 0.1 oz) (04/2022)  Temp (24hrs), Avg:98.3 F (36.8 C), Min:98.3 F (36.8 C), Max:98.3 F (36.8 C)  Recent Labs  Lab 07/08/22 1138 07/08/22 1521  WBC 16.1*  --   CREATININE 0.98  --   LATICACIDVEN  --  4.5*    Estimated Creatinine Clearance: 90.6 mL/min (by C-G formula based on SCr of 0.98 mg/dL).    Allergies  Allergen Reactions   Ace Inhibitors Cough    Antimicrobials this admission: 12/3 vancomycin >>  12/3 cefepime >>    Microbiology results: 12/3 BCx: sent 12/3 UCx: sent 12/3 Resp PCR: sent  Thank you for allowing pharmacy to be a part of this patient's care.  Darrick Penna 07/08/2022 4:28 PM

## 2022-07-08 NOTE — H&P (Addendum)
History and Physical   Corey Petersen HAL:[id number] DOB: Mar 16, 1961 DOA: 07/08/2022  PCP: [facility]  Outpatient Specialists: Dr. Allen Norris, gastroenterology Corey Petersen coming from: Home via EMS  I have personally briefly reviewed Corey Petersen's old medical records in [place].  Chief Concern: Hyperglycemia and weakness  HPI: Corey Petersen is a 61 year old male with history of insulin-dependent diabetes mellitus, hyperlipidemia, hypertension, esophagitis, depression, anxiety, who presents emergency department for chief concerns of hyperglycemia and weakness.  Initial vitals in the emergency department showed temperature of 98.3, respiration rate of 18, heart rate of 132, blood pressure 154/103, SpO2 of 96% on room air.  Serum sodium is 133, potassium 3.7, chloride 93, bicarb 21, BUN of 15, serum creatinine of 0.98, eGFR greater than 60, nonfasting blood glucose 546, WBC 16.1, hemoglobin 17.1, platelets of 356.  Lactic acid is 4.5.  Anion gap was elevated at 19.  Alk phos was elevated at 143.  High since troponin was 36 and on repeat was 36.  ED treatment: Insulin aspart 10 units one-time dose, cefepime IV, metronidazole, vancomycin, sodium chloride 2 L bolus, Protonix 40 mg IV.  At bedside, Corey Petersen is able to tell me his name, age, current location of hospital and current calendar year.  Corey Petersen reports Corey Petersen has been feeling poorly for two days. Corey Petersen denies fever, trauma, diarrhea.  Corey Petersen endorses generalized malaise, and belching.  Corey Petersen endorses dysuria. Corey Petersen endorses coughing that is nonproducitive. Corey Petersen denies sick contacts.  Corey Petersen endorses multiple episodes of nausea and vomiting. Corey Petersen endorses vomiting, up orange, green, brown products.  Corey Petersen reports Corey Petersen has not been able to tolerate p.o. intake over the last 2 days.  Social history: Corey Petersen lives at home with his mother, spouse, and children.  Corey Petersen denies tobacco, EtOH, recreational drug use.  ROS: Constitutional: no weight change, no  fever ENT/Mouth: no sore throat, no rhinorrhea Eyes: no eye pain, no vision changes Cardiovascular: no chest pain, no dyspnea,  no edema, no palpitations Respiratory: no cough, no sputum, no wheezing Gastrointestinal: + nausea, + vomiting, no diarrhea, no constipation, + belching Genitourinary: no urinary incontinence, no dysuria, no hematuria Musculoskeletal: no arthralgias, no myalgias Skin: no skin lesions, no pruritus, Neuro: + weakness, no loss of consciousness, no syncope Psych: no anxiety, no depression, + decrease appetite Heme/Lymph: no bruising, no bleeding  ED Course: Discussed with emergency medicine provider, Corey Petersen requiring hospitalization for chief concerns of sepsis.  Assessment/Plan  Principal Problem:   Severe sepsis with acute organ dysfunction (HCC) Active Problems:   Hyperglycemia due to type 2 diabetes mellitus (HCC)   Elevated liver enzymes   Hyperlipidemia   Sleep apnea   Insulin dependent type 2 diabetes mellitus ([place])   Essential hypertension   Belching   Assessment and Plan:  * Severe sepsis with acute organ dysfunction ([place]) - Corey Petersen is meeting SIRS criteria with elevated heart rate, leukocytosis of 16 point come, lactic acid of 4.5, organ involvement is endocrine - Source of infection workup is in process - Blood cultures x 2, COVID/influenza A/influenza B PCR, UA have been ordered and pending collection - Add urine culture, procalcitonin - We will continue with broad-spectrum antibiotics cefepime, vancomycin, metronidazole - Due to the emergent situation and Corey Petersen was a difficult stick, broad-spectrum antibiotics were initiated prior to blood cultures being drawn - Ordered additional LR 1 L bolus - Admit to stepdown, inpatient  Elevated liver enzymes - Query transaminitis secondary to severe sepsis - Treat per above - Agree with emergency medicine provider, we  will check right upper quadrant ultrasound - Check LFTs in the  a.m.  Hyperglycemia due to type 2 diabetes mellitus ([place]) Secondary to DKA in a Corey Petersen who has not taken his insulin for 2 days - Beta hydroxybutyrate is positive - Check ethanol level, UDS  Belching - Haldol 5 mg IV every 6 hours as needed for agitation, belching, 2 doses ordered - GI cocktail has been considered however given that Corey Petersen has had multiple episodes of nausea vomiting and inability to tolerate p.o. intake, GI cocktail has been deferred at this time  Essential hypertension - Labetalol 5 mg IV every 3 hours as needed for SBP greater than 180, 4 doses ordered  Insulin dependent type 2 diabetes mellitus ([place]) - Corey Petersen had an A1c of 8.7 on 05/01/2022 - No A1c is ordered on admission  Prolonged QTc-present on admission, presumed secondary to severe hyperglycemia and meeting sepsis criteria - We will treat as above - Avoid regular scheduled QTc prolonging agents  Chart reviewed.   AM team to complete med reconciliation  DVT prophylaxis: Enoxaparin Code Status: full code Diet: N.p.o. Family Communication: no Disposition Plan: Pending clinical course, guarded prognosis Consults called: Pharmacy Admission status: Stepdown, inpatient  Past Medical History:  Diagnosis Date   Diabetes mellitus without complication ([place])    Hypertension    Past Surgical History:  Procedure Laterality Date   COLONOSCOPY N/A 03/09/2022   Procedure: COLONOSCOPY;  Surgeon: Lucilla Lame, MD;  Location: [facility] ENDOSCOPY;  Service: Endoscopy;  Laterality: N/A;   ESOPHAGOGASTRODUODENOSCOPY N/A 03/09/2022   Procedure: ESOPHAGOGASTRODUODENOSCOPY (EGD);  Surgeon: Lucilla Lame, MD;  Location: [facility] ENDOSCOPY;  Service: Endoscopy;  Laterality: N/A;   HERNIA REPAIR     Social History:  reports that Corey Petersen has never smoked. Corey Petersen has never used smokeless tobacco. Corey Petersen reports that Corey Petersen does not drink alcohol and does not use drugs.  Allergies  Allergen Reactions   Ace Inhibitors Cough   Family History   Problem Relation Age of Onset   Hypertension Mother    Family history: Family history reviewed and not pertinent.  Prior to Admission medications   Medication Sig Start Date End Date Taking? Authorizing Provider  amLODipine (NORVASC) 10 MG tablet Take 1 tablet by mouth daily. 05/12/21   [provider]  amoxicillin (AMOXIL) 500 MG capsule Take 2 capsules (1,000 mg total) by mouth 2 (two) times daily. 03/14/22   Lucilla Lame, MD  atorvastatin (LIPITOR) 80 MG tablet TAKE ONE-HALF TABLET BY MOUTH AT BEDTIME FOR CHOLESTEROL 05/12/21   [provider]  baclofen (LIORESAL) 10 MG tablet Take 1 tablet (10 mg total) by mouth daily as needed (Hiccups). 11/10/21 11/10/22  Cuthriell, Charline Bills, PA-C  bismuth subsalicylate (PEPTO BISMOL) 262 MG chewable tablet CHEW ONE TABLET BY MOUTH FOUR TIMES A DAY H.PYLORI ERADICATION 03/29/22   [provider]  clarithromycin (BIAXIN) 500 MG tablet Take 1 tablet (500 mg total) by mouth 2 (two) times daily. 03/14/22   Lucilla Lame, MD  DULoxetine (CYMBALTA) 30 MG capsule TAKE ONE CAPSULE BY MOUTH AT BEDTIME FOR 7 DAYS, THEN TAKE TWO CAPSULES AT BEDTIME FOR HICCUPS EXACERBATED BY ANXIETY 08/08/21   [provider]  empagliflozin (JARDIANCE) 25 MG TABS tablet TAKE ONE-HALF TABLET BY MOUTH EVERY DAY FOR TYPE 2 DIABETES MELLITUS 08/08/21   [provider]  famotidine (PEPCID) 20 MG tablet Take 1 tablet (20 mg total) by mouth daily. 11/10/21 11/10/22  Cuthriell, Charline Bills, PA-C  insulin glargine-yfgn (SEMGLEE) 100 UNIT/ML injection INJECT 60 UNITS UNDER  SKIN EVERY DAY FOR DIABETES. DO NOT MIX WITH OTHER INSULINS IN THE SAME SYRINGE. DISCARD BOTTLE 28 DAYS AFTER OPENING. **REPLACES LANTUS** 08/08/21   [provider]  losartan (COZAAR) 100 MG tablet Take 1 tablet by mouth daily. 11/22/21   [provider]  metFORMIN (GLUCOPHAGE-XR) 500 MG 24 hr tablet TAKE TWO TABLETS BY MOUTH TWO TIMES A DAY FOR DIABETES NOTE DOSE INCREASE 06/14/21    [provider]  metroNIDAZOLE (FLAGYL) 250 MG tablet TAKE TWO TABLETS BY MOUTH FOUR TIMES A DAY H.PYLORI ERADICATION 03/29/22   [provider]  omeprazole (PRILOSEC) 10 MG capsule Take 1 capsule (10 mg total) by mouth daily. 11/10/21   Cuthriell, Charline Bills, PA-C  pantoprazole (PROTONIX) 20 MG tablet Take 1 tablet (20 mg total) by mouth 2 (two) times daily. 03/14/22   Lucilla Lame, MD  pantoprazole (PROTONIX) 40 MG tablet Take 1 tablet (40 mg total) by mouth daily. 03/09/22   Lucilla Lame, MD  tetracycline (SUMYCIN) 500 MG capsule TAKE ONE CAPSULE BY MOUTH FOUR TIMES A DAY H.PYLORI ERADICATION 03/29/22   [provider]   Physical Exam: Vitals:   07/08/22 1139 07/08/22 1456 07/08/22 1600  BP: (!) 154/103    Pulse: (!) 132 (!) 126   Resp: 18 20   Temp: 98.3 F (36.8 C) 98.3 F (36.8 C)   TempSrc: Oral Oral   SpO2: 98% 98%   Weight:   103 kg   Constitutional: appears older than chronological age, frail, NAD, calm, comfortable Eyes: PERRL, lids and conjunctivae normal ENMT: Mucous membranes are moist. Posterior pharynx clear of any exudate or lesions. Age-appropriate dentition. Hearing appropriate Neck: normal, supple, no masses, no thyromegaly Respiratory: clear to auscultation bilaterally, no wheezing, no crackles. Normal respiratory effort. No accessory muscle use.  Cardiovascular: Regular rate and rhythm, no murmurs / rubs / gallops. No extremity edema. 2+ pedal pulses. No carotid bruits.  Abdomen: no tenderness, no masses palpated, no hepatosplenomegaly. Bowel sounds positive.  Musculoskeletal: no clubbing / cyanosis. No joint deformity upper and lower extremities. Good ROM, no contractures, no atrophy. Normal muscle tone.  Skin: no rashes, lesions, ulcers. No induration Neurologic: Sensation intact. Strength 5/5 in all 4.  Psychiatric: Questionable poor judgment and insight. Alert and oriented x 3.  Flat affect.   EKG: independently reviewed, showing sinus  tachycardia with rate of 119, QTc 470  Chest x-ray on Admission: I personally reviewed and I agree with radiologist reading as below.  US ABDOMEN LIMITED RUQ (LIVER/GB)  Result Date: 07/08/2022 CLINICAL DATA:  Right upper quadrant pain. EXAM: ULTRASOUND ABDOMEN LIMITED RIGHT UPPER QUADRANT COMPARISON:  None Available. FINDINGS: Gallbladder: Mobile, shadowing, echogenic gallstones are seen within the dependent portion of the gallbladder lumen. The largest measures 7.7 mm. There is no evidence of gallbladder wall thickening (2.8 mm) no sonographic Murphy sign noted by sonographer. Common bile duct: Diameter: 2.4 mm Liver: No focal lesion identified. The liver is heterogeneous in appearance and within normal limits in parenchymal echogenicity. Portal vein is patent on color Doppler imaging with normal direction of blood flow towards the liver. Other: None. IMPRESSION: Cholelithiasis. Electronically Signed   By: Virgina Norfolk M.D.   On: 07/08/2022 17:39   CT ABDOMEN PELVIS W CONTRAST  Result Date: 07/08/2022 CLINICAL DATA:  Acute abdominal pain. Anorexia. Excessive belching. EXAM: CT ABDOMEN AND PELVIS WITH CONTRAST TECHNIQUE: Multidetector CT imaging of the abdomen and pelvis was performed using the standard protocol following bolus administration of intravenous contrast. RADIATION DOSE REDUCTION: This exam  was performed according to the departmental dose-optimization program which includes automated exposure control, adjustment of the mA and/or kV according to Corey Petersen size and/or use of iterative reconstruction technique. CONTRAST:  145m OMNIPAQUE IOHEXOL 300 MG/ML  SOLN COMPARISON:  11/10/2021 FINDINGS: Lower Chest: No acute findings. Hepatobiliary: No hepatic masses identified. Mild-to-moderate diffuse hepatic steatosis is again noted. Gallstones are seen, however there is no evidence of cholecystitis or biliary dilatation. Pancreas:  No mass or inflammatory changes. Spleen: Within normal limits in  size and appearance. Adrenals/Urinary Tract: No suspicious masses identified. Stable 8 cm benign-appearing left renal cyst (no followup imaging is recommended). No evidence of ureteral calculi or hydronephrosis. Unremarkable unopacified urinary bladder. Stomach/Bowel: Mild diffuse wall thickening of the distal thoracic esophagus is unchanged and suspicious for esophagitis. No evidence of hiatal hernia. No evidence of obstruction, inflammatory process or abnormal fluid collections. Normal appendix visualized. Vascular/Lymphatic: No pathologically enlarged lymph nodes. No acute vascular findings. Aortic atherosclerotic calcification incidentally noted. Reproductive:  No mass or other significant abnormality. Other:  None. Musculoskeletal:  No suspicious bone lesions identified. IMPRESSION: No acute findings. Stable mild diffuse wall thickening of distal thoracic esophagus, suspicious for esophagitis. Consider endoscopy for further evaluation. Hepatic steatosis. Cholelithiasis. No radiographic evidence of cholecystitis. Aortic Atherosclerosis (ICD10-I70.0). Electronically Signed   By: JMarlaine HindM.D.   On: 07/08/2022 15:00   DG Chest Portable 1 View  Result Date: 07/08/2022 CLINICAL DATA:  Nausea and vomiting.  Evaluate for free air. EXAM: PORTABLE CHEST 1 VIEW COMPARISON:  January 27, 2022 FINDINGS: The heart size and mediastinal contours are within normal limits. Both lungs are clear. No free air is noted. The visualized skeletal structures are stable. IMPRESSION: No active cardiopulmonary disease.  No free air is noted. Electronically Signed   By: WAbelardo DieselM.D.   On: 07/08/2022 14:05    Labs on Admission: I have personally reviewed following labs  CBC: Recent Labs  Lab 07/08/22 1138  WBC 16.1*  HGB 17.1*  HCT 46.6  MCV 91.6  PLT 3128  Basic Metabolic Panel: Recent Labs  Lab 07/08/22 1138  NA 133*  K 3.7  CL 93*  CO2 21*  GLUCOSE 546*  BUN 15  CREATININE 0.98  CALCIUM 8.0*    GFR: Estimated Creatinine Clearance: 90.6 mL/min (by C-G formula based on SCr of 0.98 mg/dL).  Liver Function Tests: Recent Labs  Lab 07/08/22 1138  AST 29  ALT 17  ALKPHOS 143*  BILITOT 1.6*  PROT 6.6  ALBUMIN 2.8*   Recent Labs  Lab 07/08/22 1138  LIPASE 28   Urine analysis:    Component Value Date/Time   COLORURINE YELLOW (A) 07/08/2022 1716   APPEARANCEUR CLEAR (A) 07/08/2022 1716   LABSPEC >1.046 (H) 07/08/2022 1716   PHURINE 6.0 07/08/2022 1716   GLUCOSEU >=500 (A) 07/08/2022 1716   HGBUR NEGATIVE 07/08/2022 1716   BAli MolinaNEGATIVE 07/08/2022 1716   KETONESUR NEGATIVE 07/08/2022 1716   PROTEINUR NEGATIVE 07/08/2022 1716   NITRITE NEGATIVE 07/08/2022 1[place]NEGATIVE 07/08/2022 1716   CRITICAL CARE Performed by: Dr. CTobie Poet Total critical care time: 35 minutes  Critical care time was exclusive of separately billable procedures and treating other patients.  Critical care was necessary to treat or prevent imminent or life-threatening deterioration.  Critical care was time spent personally by me on the following activities: development of treatment plan with Corey Petersen and/or surrogate as well as nursing, discussions with consultants, evaluation of Corey Petersen's response to treatment, examination of Corey Petersen, obtaining  history from Corey Petersen or surrogate, ordering and performing treatments and interventions, ordering and review of laboratory studies, ordering and review of radiographic studies, pulse oximetry and re-evaluation of Corey Petersen's condition.  Dr. Tobie Poet [facility]  If 7PM-7AM, please contact overnight-coverage provider If 7AM-7PM, please contact day coverage provider [email or web address]  07/08/2022, 6:54 PM

## 2022-07-08 NOTE — Assessment & Plan Note (Addendum)
Secondary to DKA in a patient who has not taken his insulin for 2 days - Beta hydroxybutyrate is positive - Check ethanol level, UDS

## 2022-07-08 NOTE — Assessment & Plan Note (Signed)
-   Patient had an A1c of 8.7 on 05/01/2022 - No A1c is ordered on admission

## 2022-07-08 NOTE — ED Notes (Signed)
Assumed care of patient at this time. Report received from [place], EMT-P.

## 2022-07-09 DIAGNOSIS — E1110 Type 2 diabetes mellitus with ketoacidosis without coma: Secondary | ICD-10-CM | POA: Insufficient documentation

## 2022-07-09 HISTORY — DX: Type 2 diabetes mellitus with ketoacidosis without coma: E11.10

## 2022-07-09 LAB — CORTISOL-AM, BLOOD: Cortisol - AM: 15.6 ug/dL (ref 6.7–22.6)

## 2022-07-09 LAB — CBC WITH DIFFERENTIAL/PLATELET
Abs Immature Granulocytes: 0.08 10*3/uL — ABNORMAL HIGH (ref 0.00–0.07)
Basophils Absolute: 0 10*3/uL (ref 0.0–0.1)
Basophils Relative: 0 %
Eosinophils Absolute: 0 10*3/uL (ref 0.0–0.5)
Eosinophils Relative: 0 %
HCT: 39.7 % (ref 39.0–52.0)
Hemoglobin: 14.1 g/dL (ref 13.0–17.0)
Immature Granulocytes: 1 %
Lymphocytes Relative: 15 %
Lymphs Abs: 2.1 10*3/uL (ref 0.7–4.0)
MCH: 33.3 pg (ref 26.0–34.0)
MCHC: 35.5 g/dL (ref 30.0–36.0)
MCV: 93.6 fL (ref 80.0–100.0)
Monocytes Absolute: 0.9 10*3/uL (ref 0.1–1.0)
Monocytes Relative: 7 %
Neutro Abs: 11.1 10*3/uL — ABNORMAL HIGH (ref 1.7–7.7)
Neutrophils Relative %: 77 %
Platelets: 217 10*3/uL (ref 150–400)
RBC: 4.24 MIL/uL (ref 4.22–5.81)
RDW: 12.1 % (ref 11.5–15.5)
WBC: 14.3 10*3/uL — ABNORMAL HIGH (ref 4.0–10.5)
nRBC: 0 % (ref 0.0–0.2)

## 2022-07-09 LAB — BASIC METABOLIC PANEL
Anion gap: 6 (ref 5–15)
BUN: 12 mg/dL (ref 8–23)
CO2: 27 mmol/L (ref 22–32)
Calcium: 7.8 mg/dL — ABNORMAL LOW (ref 8.9–10.3)
Chloride: 105 mmol/L (ref 98–111)
Creatinine, Ser: 0.7 mg/dL (ref 0.61–1.24)
GFR, Estimated: >60 mL/min (ref >60)
Glucose, Bld: 145 mg/dL — ABNORMAL HIGH (ref 70–99)
Potassium: 2.8 mmol/L — ABNORMAL LOW (ref 3.5–5.1)
Sodium: 138 mmol/L (ref 135–145)

## 2022-07-09 LAB — HEPATIC FUNCTION PANEL
ALT: 15 U/L (ref 0–44)
AST: 19 U/L (ref 15–41)
Albumin: 2.3 g/dL — ABNORMAL LOW (ref 3.5–5.0)
Alkaline Phosphatase: 102 U/L (ref 38–126)
Bilirubin, Direct: 0.4 mg/dL — ABNORMAL HIGH (ref 0.0–0.2)
Indirect Bilirubin: 1.6 mg/dL — ABNORMAL HIGH (ref 0.3–0.9)
Total Bilirubin: 2 mg/dL — ABNORMAL HIGH (ref 0.3–1.2)
Total Protein: 5.1 g/dL — ABNORMAL LOW (ref 6.5–8.1)

## 2022-07-09 LAB — GLUCOSE, CAPILLARY
Glucose-Capillary: 137 mg/dL — ABNORMAL HIGH (ref 70–99)
Glucose-Capillary: 106 mg/dL — ABNORMAL HIGH (ref 70–99)

## 2022-07-09 LAB — CBG MONITORING, ED
Glucose-Capillary: 106 mg/dL — ABNORMAL HIGH (ref 70–99)
Glucose-Capillary: 110 mg/dL — ABNORMAL HIGH (ref 70–99)
Glucose-Capillary: 142 mg/dL — ABNORMAL HIGH (ref 70–99)
Glucose-Capillary: 146 mg/dL — ABNORMAL HIGH (ref 70–99)
Glucose-Capillary: 153 mg/dL — ABNORMAL HIGH (ref 70–99)
Glucose-Capillary: 162 mg/dL — ABNORMAL HIGH (ref 70–99)

## 2022-07-09 LAB — MAGNESIUM: Magnesium: 1.8 mg/dL (ref 1.7–2.4)

## 2022-07-09 LAB — LACTIC ACID, PLASMA: Lactic Acid, Venous: 1.5 mmol/L (ref 0.5–1.9)

## 2022-07-09 LAB — HIV ANTIBODY (ROUTINE TESTING W REFLEX): HIV Screen 4th Generation wRfx: NONREACTIVE

## 2022-07-09 LAB — BETA-HYDROXYBUTYRIC ACID: Beta-Hydroxybutyric Acid: 0.37 mmol/L — ABNORMAL HIGH (ref 0.05–0.27)

## 2022-07-09 MED ORDER — TAMSULOSIN HCL 0.4 MG PO CAPS
0.4000 mg | ORAL_CAPSULE | Freq: Every day | ORAL | Status: DC
  Administered 2022-07-09 – 2022-07-10 (×2): 0.4 mg via ORAL
  Filled 2022-07-09 (×2): qty 1

## 2022-07-09 MED ORDER — INSULIN ASPART 100 UNIT/ML IJ SOLN
0.0000 [IU] | Freq: Three times a day (TID) | INTRAMUSCULAR | Status: DC
  Administered 2022-07-09 – 2022-07-10 (×2): 2 [IU] via SUBCUTANEOUS
  Filled 2022-07-09 (×2): qty 1

## 2022-07-09 MED ORDER — INSULIN ASPART 100 UNIT/ML IJ SOLN
4.0000 [IU] | Freq: Three times a day (TID) | INTRAMUSCULAR | Status: DC
  Administered 2022-07-09 – 2022-07-10 (×2): 4 [IU] via SUBCUTANEOUS
  Filled 2022-07-09 (×2): qty 1

## 2022-07-09 MED ORDER — PANTOPRAZOLE SODIUM 40 MG PO TBEC
40.0000 mg | DELAYED_RELEASE_TABLET | Freq: Two times a day (BID) | ORAL | Status: DC
  Administered 2022-07-09 – 2022-07-10 (×3): 40 mg via ORAL
  Filled 2022-07-09 (×3): qty 1

## 2022-07-09 MED ORDER — MORPHINE SULFATE (PF) 2 MG/ML IV SOLN
2.0000 mg | Freq: Once | INTRAVENOUS | Status: DC
  Filled 2022-07-09: qty 1

## 2022-07-09 MED ORDER — BACLOFEN 10 MG PO TABS: 10.0000 mg | ORAL_TABLET | Freq: Every day | ORAL | Status: DC | PRN

## 2022-07-09 MED ORDER — LORAZEPAM 2 MG/ML IJ SOLN: 1.0000 mg | Freq: Once | INTRAMUSCULAR | Status: DC

## 2022-07-09 MED ORDER — CALCIUM CARBONATE ANTACID 500 MG PO CHEW
200.0000 mg | CHEWABLE_TABLET | Freq: Three times a day (TID) | ORAL | Status: DC
  Administered 2022-07-09 – 2022-07-10 (×3): 200 mg via ORAL
  Filled 2022-07-09 (×3): qty 1

## 2022-07-09 MED ORDER — FAMOTIDINE 20 MG PO TABS
20.0000 mg | ORAL_TABLET | Freq: Every day | ORAL | Status: DC
  Administered 2022-07-09 – 2022-07-10 (×2): 20 mg via ORAL
  Filled 2022-07-09 (×2): qty 1

## 2022-07-09 MED ORDER — POTASSIUM CHLORIDE 10 MEQ/100ML IV SOLN
10.0000 meq | INTRAVENOUS | Status: DC
  Administered 2022-07-09 (×4): 10 meq via INTRAVENOUS
  Filled 2022-07-09 (×3): qty 100

## 2022-07-09 MED ORDER — INSULIN GLARGINE-YFGN 100 UNIT/ML ~~LOC~~ SOLN
53.0000 [IU] | Freq: Every day | SUBCUTANEOUS | Status: DC
  Administered 2022-07-09 – 2022-07-10 (×2): 53 [IU] via SUBCUTANEOUS
  Filled 2022-07-09 (×2): qty 0.53

## 2022-07-09 MED ORDER — METOPROLOL SUCCINATE ER 25 MG PO TB24
25.0000 mg | ORAL_TABLET | Freq: Every day | ORAL | Status: DC
  Administered 2022-07-09 – 2022-07-10 (×2): 25 mg via ORAL
  Filled 2022-07-09 (×2): qty 1

## 2022-07-09 MED ORDER — POTASSIUM CHLORIDE CRYS ER 20 MEQ PO TBCR
40.0000 meq | EXTENDED_RELEASE_TABLET | Freq: Once | ORAL | Status: AC
  Administered 2022-07-09: 40 meq via ORAL
  Filled 2022-07-09: qty 2

## 2022-07-09 MED ORDER — LACTATED RINGERS IV SOLN: INTRAVENOUS | Status: DC

## 2022-07-09 MED ORDER — INSULIN ASPART 100 UNIT/ML IJ SOLN: 0.0000 [IU] | Freq: Every day | INTRAMUSCULAR | Status: DC

## 2022-07-09 MED ORDER — INSULIN ASPART 100 UNIT/ML IJ SOLN
0.0000 [IU] | INTRAMUSCULAR | Status: DC
  Administered 2022-07-09 (×2): 3 [IU] via SUBCUTANEOUS
  Filled 2022-07-09 (×2): qty 1

## 2022-07-09 MED ORDER — METOCLOPRAMIDE HCL 5 MG PO TABS
5.0000 mg | ORAL_TABLET | Freq: Three times a day (TID) | ORAL | Status: DC
  Administered 2022-07-09 – 2022-07-10 (×3): 5 mg via ORAL
  Filled 2022-07-09 (×3): qty 1

## 2022-07-09 NOTE — ED Notes (Signed)
Informed RN bed assigned 

## 2022-07-09 NOTE — Progress Notes (Signed)
Progress Note   Patient: Corey Petersen DOB: 1961-05-12 DOA: 07/08/2022     1 DOS: the patient was seen and examined on 07/09/2022   Brief hospital course: [name] is a 61 year old male with history of insulin-dependent diabetes mellitus, hyperlipidemia, hypertension, esophagitis, depression, anxiety, who presents emergency department for chief concerns of hyperglycemia and weakness.  Initial vitals in the emergency department showed temperature of 98.3, respiration rate of 18, heart rate of 132, blood pressure 154/103, SpO2 of 96% on room air.  Serum sodium is 133, potassium 3.7, chloride 93, bicarb 21, BUN of 15, serum creatinine of 0.98, eGFR greater than 60, nonfasting blood glucose 546, WBC 16.1, hemoglobin 17.1, platelets of 356.  Lactic acid is 4.5.  Anion gap was elevated at 19.  Alk phos was elevated at 143.  High since troponin was 36 and on repeat was 36.  ED treatment: Insulin aspart 10 units one-time dose, cefepime IV, metronidazole, vancomycin, sodium chloride 2 L bolus, Protonix 40 mg IV.  For concern of DKA with elevated beta apoptotic acid, he was started on Endo tool which was later transitioned to basal and SSI.  CT abdomen and pelvis with cholelithiasis, no cholecystitis or colitis.  Concern of mild esophagitis, most likely with nausea and vomiting.  [place]  RUQ also with cholelithiasis only. Chest x-ray with no acute abnormality.  UA negative.  Procalcitonin negative.  12/4: Blood pressure mildly elevated at 158/80.  DKA has been resolved.  Hypokalemia with potassium of 2.8 which is being repleted.  Procalcitonin remain negative so antibiotics are being discontinued.  Preliminary blood cultures negative, urine cultures pending but UA does not look infected. Patient with a lot of belching which is going on for a while.  Concern of gastroparesis which need an outpatient GI workup.  No more nausea or vomiting. Started on diet.  Also starting him on  Reglan and Tums with food.  Protonix infusion was discontinued and he was started on twice daily Protonix p.o.  Assessment and Plan: * Severe sepsis with acute organ dysfunction [facility]) - Patient is meeting SIRS criteria with elevated heart rate, leukocytosis of 16 point come, lactic acid of 4.5, organ involvement is endocrine. Sepsis currently ruled out as there is no source of infection.  SIRS response with DKA.  Pending urine culture, but urine does not look infected.  Preliminary blood cultures negative.  Might have some viral gastritis which is improving. -Discontinuing antibiotics -Continue with supportive care  Elevated liver enzymes Alkaline phosphatase has been normalized.  T. bili still at 2. -Continue with IV fluid  Hyperglycemia due to type 2 diabetes mellitus ([place]) DKA resolved. Secondary to DKA in a patient who has not taken his insulin for 2 days - Beta hydroxybutyrate is positive -Continue with Semglee 53 units daily-we will adjust accordingly -Switch to SSI with meals -Add 4 units for mealtime coverage   Belching Concern of gastroparesis with diabetes. -Reglan 5 mg 3 times daily with meals -Tums with meals -Twice daily Protonix -Will get benefit from outpatient GI evaluation  Insulin dependent type 2 diabetes mellitus ([place]) - Patient had an A1c of 8.7 on 05/01/2022 - No A1c is ordered on admission  Essential hypertension Blood pressure mildly elevated. -Continue home amlodipine and losartan. -Continue home metoprolol-restarted today  Hyperlipidemia -Home statin was being held initially due to mild transaminitis. -He can resume on discharge  Sleep apnea -Continue CPAP at night    Subjective: Patient was seen and examined today.  Continued to have  belching.  No nausea, vomiting or diarrhea.  Belching is going on for a while.  He was just feeling very fatigued.  Physical Exam: Vitals:   07/09/22 0901 07/09/22 1000 07/09/22 1100 07/09/22 1206  BP: (!)  140/84 (!) 157/87 (!) 152/80 (!) 151/74  Pulse: 92 88 97 94  Resp: 20   20  Temp:    97.8 F (36.6 C)  TempSrc:    Oral  SpO2: 95% 95% 97% 95%  Weight:       General.  Obese gentleman, in no acute distress. Pulmonary.  Lungs clear bilaterally, normal respiratory effort. CV.  Regular rate and rhythm, no JVD, rub or murmur. Abdomen.  Soft, nontender, nondistended, BS positive. CNS.  Alert and oriented .  No focal neurologic deficit. Extremities.  No edema, no cyanosis, pulses intact and symmetrical. Psychiatry.  Judgment and insight appears normal.   Data Reviewed: Prior data reviewed  Family Communication: Discussed with patient  Disposition: Status is: Inpatient Remains inpatient appropriate because: Severity of illness  Planned Discharge Destination: Home  DVT prophylaxis.  Lovenox Time spent: 50 minutes  This record has been created using Systems analyst. Errors have been sought and corrected,but may not always be located. Such creation errors do not reflect on the standard of care.   Author: Lorella Nimrod, MD 07/09/2022 12:34 PM  For on call review [email or web address].

## 2022-07-09 NOTE — Inpatient Diabetes Management (Addendum)
Inpatient Diabetes Program Recommendations  AACE/ADA: New Consensus Statement on Inpatient Glycemic Control Target Ranges:  Prepandial:   less than 140 mg/dL      Peak postprandial:   less than 180 mg/dL (1-2 hours)      Critically ill patients:  140 - 180 mg/dL    Latest Reference Range & Units 07/09/22 00:35 07/09/22 01:39 07/09/22 03:06 07/09/22 04:38 07/09/22 07:45  Glucose-Capillary 70 - 99 mg/dL 142 (H) 106 (H) 146 (H) 153 (H) 110 (H)    Latest Reference Range & Units 07/08/22 11:36 07/08/22 18:05 07/08/22 19:26 07/08/22 20:43 07/08/22 21:43 07/08/22 23:07  Glucose-Capillary 70 - 99 mg/dL 590 (HH) 303 (H) 200 (H) 126 (H) 124 (H) 140 (H)    Latest Reference Range & Units 07/08/22 11:38  CO2 22 - 32 mmol/L 21 (L)  Glucose 70 - 99 mg/dL 546 (HH)  Anion gap 5 - 15  19 (H)   Review of Glycemic Control  Diabetes history: DM2 Outpatient Diabetes medications: Semglee 35 units BID, Jardiance 25 mg daily, Glipizide 10 mg BID, Metformin XR 1000 mg BID Current orders for Inpatient glycemic control: Semglee 53 units daily, Novolog 0-15 units Q4H  Inpatient Diabetes Program Recommendations:    Insulin: Agree with current insulin orders.   NOT: Patient admitted with sepsis with initial glucose of 546 mg/dl on 07/08/22. Per ED note on 07/08/22,"nausea vomiting epigastric pain for the past 3 days not keeping anything down.  Did not have any insulin over the past 24 hours due to tolerating p.o."  Patient was on IV insulin drip which was transitioned to SQ insulin regimen this morning. Patient received Semglee 53 units at 1:50 am today. Will plan to follow up with patient today.  Addendum 07/09/22'@13'$ :30-Spoke with patient at bedside about diabetes and home regimen for diabetes control. Patient reports being [facility] diabetes [facility] and currently taking DM medications as listed above.Patient reports that he gives himself insulin in his abdomen or thigh and he denies any hardened areas in  those areas.  Patient reports taking DM medications as prescribed and last seen PCP 06/13/22 and no changes were made with DM medications at his last office visit. Patient reports he was referred to Endocrinology but has not heard from anyone about it yet. Patients reports that over the past several days he felt so poorly he had not taken any of his DM medications. Discussed importance of taking insulin especially when he is sick; discussed sick day rules. Patient reports his glucose is always 300-400's mg/dl despite taking his DM medications as prescribed. Patient confirms that he consistently has symptoms of hyperglycemia. Discussed glucose and A1C goals. Discussed importance of checking CBGs and maintaining good CBG control to prevent long-term and short-term complications. Explained how hyperglycemia leads to damage within blood vessels which lead to the common complications seen with uncontrolled diabetes. Stressed to the patient the importance of improving glycemic control to prevent further complications from uncontrolled diabetes. Discussed impact of nutrition, exercise, stress, sickness, and medications on diabetes control.  Encouraged patient to call PCP and inquire about Endocrinology referral. Encouraged patient to take DM medications consistently prescribed and reach out to provider about possible adjustments to get DM under better control. Inquired about [employer] and states that he has heard of it and seen commercials for it but he has never used CGM sensors. Discussed benefit of using CGM sensors to obtain more information on glucose trends which may help his providers to make adjustments with DM medications.  Patient states he would be interested in using CGM.  Patient verbalized understanding of information discussed and reports no further questions at this time related to diabetes.  Thanks, Barnie Alderman, RN, MSN, [place] [facility] Coordinator Inpatient Diabetes Program [phone] (Team  Pager from 8am to [place])

## 2022-07-09 NOTE — Assessment & Plan Note (Signed)
-  Home statin was being held initially due to mild transaminitis. -He can resume on discharge

## 2022-07-09 NOTE — ED Notes (Signed)
Pt yelling out and belching - denies abd pain or nausea/vomting - notified MD no PRN orders available for symptoms.

## 2022-07-09 NOTE — Assessment & Plan Note (Signed)
Continue CPAP at night ?

## 2022-07-09 NOTE — Progress Notes (Signed)
       CROSS COVER NOTE  NAME: Corey Petersen MRN: [id number] DOB : [birth year] ATTENDING PHYSICIAN: Cox, Amy N, DO   Notified by nursing they received ENDOTOOL alert that patient is to be transitioned off of insulin infusion.  Chart reviewed.  Gap is closed and beta-hydroxybutyrate level normalizing.    Will resume home basal glargine This will include 53U of basal insulin daily while NPO, this is a 25% dose reduction from his home regimen of 70U glargine daily.   Insulin infusion to be stopped 2 hrs after administration of basal insulin.     Initiating q4H accuchecks with sliding scale insulin.   This document was prepared using Dragon voice recognition software and may include unintentional dictation errors.  Neomia Glass DNP, MBA, FNP-BC Nurse Practitioner [facility] Pager [phone]

## 2022-07-09 NOTE — ED Notes (Signed)
CBG 153 

## 2022-07-10 LAB — GLUCOSE, CAPILLARY
Glucose-Capillary: 107 mg/dL — ABNORMAL HIGH (ref 70–99)
Glucose-Capillary: 125 mg/dL — ABNORMAL HIGH (ref 70–99)
Glucose-Capillary: 98 mg/dL (ref 70–99)

## 2022-07-10 LAB — BASIC METABOLIC PANEL
Anion gap: 7 (ref 5–15)
BUN: 7 mg/dL — ABNORMAL LOW (ref 8–23)
CO2: 28 mmol/L (ref 22–32)
Calcium: 8.1 mg/dL — ABNORMAL LOW (ref 8.9–10.3)
Chloride: 105 mmol/L (ref 98–111)
Creatinine, Ser: 0.62 mg/dL (ref 0.61–1.24)
GFR, Estimated: >60 mL/min (ref >60)
Glucose, Bld: 102 mg/dL — ABNORMAL HIGH (ref 70–99)
Potassium: 3.4 mmol/L — ABNORMAL LOW (ref 3.5–5.1)
Sodium: 140 mmol/L (ref 135–145)

## 2022-07-10 LAB — HEMOGLOBIN A1C
Hgb A1c MFr Bld: 8.7 % — ABNORMAL HIGH (ref 4.8–5.6)
Mean Plasma Glucose: 203 mg/dL

## 2022-07-10 LAB — URINE CULTURE: Culture: 40000 — AB

## 2022-07-10 MED ORDER — PANTOPRAZOLE SODIUM 20 MG PO TBEC: 20.0000 mg | DELAYED_RELEASE_TABLET | Freq: Two times a day (BID) | ORAL | 0 refills | Status: DC

## 2022-07-10 MED ORDER — METOCLOPRAMIDE HCL 5 MG PO TABS: 5.0000 mg | ORAL_TABLET | Freq: Three times a day (TID) | ORAL | 0 refills | Status: DC

## 2022-07-10 MED ORDER — BACLOFEN 10 MG PO TABS: 10.0000 mg | ORAL_TABLET | Freq: Every day | ORAL | 0 refills | Status: DC | PRN

## 2022-07-10 MED ORDER — POTASSIUM CHLORIDE CRYS ER 20 MEQ PO TBCR: 40.0000 meq | EXTENDED_RELEASE_TABLET | Freq: Once | ORAL | Status: DC

## 2022-07-10 MED ORDER — CALCIUM CARBONATE ANTACID 500 MG PO CHEW: 200.0000 mg | CHEWABLE_TABLET | Freq: Three times a day (TID) | ORAL | 1 refills | Status: AC

## 2022-07-10 NOTE — Discharge Summary (Signed)
Physician Discharge Summary   Patient: Corey Petersen MRN: [id number] DOB: 02-20-1961  Admit date:     07/08/2022  Discharge date: 07/10/22  Discharge Physician: Lorella Nimrod   PCP: [facility]   Recommendations at discharge:  Follow-up with primary care provider Follow-up with gastroenterology  Discharge Diagnoses: Principal Problem:   Severe sepsis with acute organ dysfunction ([place]) Active Problems:   Elevated liver enzymes   Hyperglycemia due to type 2 diabetes mellitus (HCC)   Belching   Insulin dependent type 2 diabetes mellitus ([place])   Essential hypertension   Hyperlipidemia   Sleep apnea   Hospital Course: [name] is a 61 year old male with history of insulin-dependent diabetes mellitus, hyperlipidemia, hypertension, esophagitis, depression, anxiety, who presents emergency department for chief concerns of hyperglycemia and weakness.  Initial vitals in the emergency department showed temperature of 98.3, respiration rate of 18, heart rate of 132, blood pressure 154/103, SpO2 of 96% on room air.  Serum sodium is 133, potassium 3.7, chloride 93, bicarb 21, BUN of 15, serum creatinine of 0.98, eGFR greater than 60, nonfasting blood glucose 546, WBC 16.1, hemoglobin 17.1, platelets of 356.  Lactic acid is 4.5.  Anion gap was elevated at 19.  Alk phos was elevated at 143.  High since troponin was 36 and on repeat was 36.  ED treatment: Insulin aspart 10 units one-time dose, cefepime IV, metronidazole, vancomycin, sodium chloride 2 L bolus, Protonix 40 mg IV.  For concern of DKA with elevated beta apoptotic acid, he was started on Endo tool which was later transitioned to basal and SSI.  CT abdomen and pelvis with cholelithiasis, no cholecystitis or colitis.  Concern of mild esophagitis, most likely with nausea and vomiting.  [place]  RUQ also with cholelithiasis only. Chest x-ray with no acute abnormality.  UA negative.  Procalcitonin negative.  12/4:  Blood pressure mildly elevated at 158/80.  DKA has been resolved.  Hypokalemia with potassium of 2.8 which is being repleted.  Procalcitonin remain negative so antibiotics are being discontinued.  Preliminary blood cultures negative, urine cultures pending but UA does not look infected. Patient with a lot of belching which is going on for a while.  Concern of gastroparesis which need an outpatient GI workup.  No more nausea or vomiting. Started on diet.  Also starting him on Reglan and Tums with food.  Protonix infusion was discontinued and he was started on twice daily Protonix p.o.  12/5: Patient with improvement in his belching after starting Reglan.  He might be having gastroparesis which need to be investigated by gastroenterology as an outpatient for further management.  He will continue with twice daily Protonix and can also use Tums as needed for excessive belching.  A new prescription of baclofen was also given to be used as needed for hiccup.  Patient should be using small frequent meals to help.  He is being discharged to continue his home medications, only addition of was Reglan.  Patient need to follow-up closely with his providers for further recommendations.  Assessment and Plan: * Severe sepsis with acute organ dysfunction [facility]) - Patient is meeting SIRS criteria with elevated heart rate, leukocytosis of 16 point come, lactic acid of 4.5, organ involvement is endocrine. Sepsis currently ruled out as there is no source of infection.  SIRS response with DKA.  Pending urine culture, but urine does not look infected.  Preliminary blood cultures negative.  Might have some viral gastritis which is improving. -Discontinuing antibiotics -Continue with supportive  care  Elevated liver enzymes Alkaline phosphatase has been normalized.  T. bili still at 2. -Continue with IV fluid  Hyperglycemia due to type 2 diabetes mellitus ([place]) DKA resolved. Secondary to DKA in a patient who has not  taken his insulin for 2 days - Beta hydroxybutyrate is positive -Continue with Semglee 53 units daily-we will adjust accordingly -Switch to SSI with meals -Add 4 units for mealtime coverage   Belching Concern of gastroparesis with diabetes. -Reglan 5 mg 3 times daily with meals -Tums with meals -Twice daily Protonix -Will get benefit from outpatient GI evaluation  Insulin dependent type 2 diabetes mellitus ([place]) - Patient had an A1c of 8.7 on 05/01/2022 - No A1c is ordered on admission  Essential hypertension Blood pressure mildly elevated. -Continue home amlodipine and losartan. -Continue home metoprolol-restarted today  Hyperlipidemia -Home statin was being held initially due to mild transaminitis. -He can resume on discharge  Sleep apnea -Continue CPAP at night   Consultants: None Procedures performed: None Disposition: Home Diet recommendation:  Discharge Diet Orders (From admission, onward)     Start     Ordered   07/10/22 0000  Diet - low sodium heart healthy        07/10/22 1112           Cardiac and Carb modified diet DISCHARGE MEDICATION: Allergies as of 07/10/2022       Reactions   Ace Inhibitors Cough        Medication List     TAKE these medications    amLODipine 10 MG tablet Commonly known as: NORVASC Take 1 tablet by mouth daily.   atorvastatin 80 MG tablet Commonly known as: LIPITOR TAKE ONE-HALF TABLET BY MOUTH AT BEDTIME FOR CHOLESTEROL   baclofen 10 MG tablet Commonly known as: LIORESAL Take 1 tablet (10 mg total) by mouth daily as needed (Hiccups).   bismuth subsalicylate 426 MG chewable tablet Commonly known as: PEPTO BISMOL CHEW ONE TABLET BY MOUTH FOUR TIMES A DAY H.PYLORI ERADICATION   calcium carbonate 500 MG chewable tablet Commonly known as: TUMS - dosed in mg elemental calcium Chew 1 tablet (200 mg of elemental calcium total) by mouth 3 (three) times daily with meals.   celecoxib 200 MG capsule Commonly known  as: CELEBREX Take 200 mg by mouth 2 (two) times daily.   DULoxetine 30 MG capsule Commonly known as: CYMBALTA TAKE ONE CAPSULE BY MOUTH AT BEDTIME FOR 7 DAYS, THEN TAKE TWO CAPSULES AT BEDTIME FOR HICCUPS EXACERBATED BY ANXIETY   empagliflozin 25 MG Tabs tablet Commonly known as: JARDIANCE Take 25 mg by mouth daily.   famotidine 20 MG tablet Commonly known as: PEPCID Take 1 tablet (20 mg total) by mouth daily.   glipiZIDE 10 MG tablet Commonly known as: GLUCOTROL Take 10 mg by mouth 2 (two) times daily before a meal.   insulin glargine-yfgn 100 UNIT/ML injection Commonly known as: SEMGLEE 70 Units at bedtime.   losartan 100 MG tablet Commonly known as: COZAAR Take 1 tablet by mouth daily.   metFORMIN 500 MG 24 hr tablet Commonly known as: GLUCOPHAGE-XR TAKE TWO TABLETS BY MOUTH TWO TIMES A DAY FOR DIABETES NOTE DOSE INCREASE   metoCLOPramide 5 MG tablet Commonly known as: REGLAN Take 1 tablet (5 mg total) by mouth 3 (three) times daily before meals.   metoprolol succinate 50 MG 24 hr tablet Commonly known as: TOPROL-XL Take 25 mg by mouth daily.   pantoprazole 20 MG tablet Commonly known as: PROTONIX Take 1  tablet (20 mg total) by mouth 2 (two) times daily.   tamsulosin 0.4 MG Caps capsule Commonly known as: FLOMAX Take 0.4 mg by mouth daily.        Follow-up [facility]. Schedule an appointment as soon as possible for a visit in 1 week(s).   Specialty: General Practice Contact information: [place] [address] [phone]                Discharge Exam: Danley Danker Weights   07/08/22 1600 07/09/22 1742  Weight: 103 kg 103 kg   General.     In no acute distress. Pulmonary.  Lungs clear bilaterally, normal respiratory effort. CV.  Regular rate and rhythm, no JVD, rub or murmur. Abdomen.  Soft, nontender, nondistended, BS positive. CNS.  Alert and oriented .  No focal neurologic deficit. Extremities.  No  edema, no cyanosis, pulses intact and symmetrical. Psychiatry.  Judgment and insight appears normal.   Condition at discharge: stable  The results of significant diagnostics from this hospitalization (including imaging, microbiology, ancillary and laboratory) are listed below for reference.   Imaging Studies: US ABDOMEN LIMITED RUQ (LIVER/GB)  Result Date: 07/08/2022 CLINICAL DATA:  Right upper quadrant pain. EXAM: ULTRASOUND ABDOMEN LIMITED RIGHT UPPER QUADRANT COMPARISON:  None Available. FINDINGS: Gallbladder: Mobile, shadowing, echogenic gallstones are seen within the dependent portion of the gallbladder lumen. The largest measures 7.7 mm. There is no evidence of gallbladder wall thickening (2.8 mm) no sonographic Murphy sign noted by sonographer. Common bile duct: Diameter: 2.4 mm Liver: No focal lesion identified. The liver is heterogeneous in appearance and within normal limits in parenchymal echogenicity. Portal vein is patent on color Doppler imaging with normal direction of blood flow towards the liver. Other: None. IMPRESSION: Cholelithiasis. Electronically Signed   By: Virgina Norfolk M.D.   On: 07/08/2022 17:39   CT ABDOMEN PELVIS W CONTRAST  Result Date: 07/08/2022 CLINICAL DATA:  Acute abdominal pain. Anorexia. Excessive belching. EXAM: CT ABDOMEN AND PELVIS WITH CONTRAST TECHNIQUE: Multidetector CT imaging of the abdomen and pelvis was performed using the standard protocol following bolus administration of intravenous contrast. RADIATION DOSE REDUCTION: This exam was performed according to the departmental dose-optimization program which includes automated exposure control, adjustment of the mA and/or kV according to patient size and/or use of iterative reconstruction technique. CONTRAST:  147m OMNIPAQUE IOHEXOL 300 MG/ML  SOLN COMPARISON:  11/10/2021 FINDINGS: Lower Chest: No acute findings. Hepatobiliary: No hepatic masses identified. Mild-to-moderate diffuse hepatic steatosis is  again noted. Gallstones are seen, however there is no evidence of cholecystitis or biliary dilatation. Pancreas:  No mass or inflammatory changes. Spleen: Within normal limits in size and appearance. Adrenals/Urinary Tract: No suspicious masses identified. Stable 8 cm benign-appearing left renal cyst (no followup imaging is recommended). No evidence of ureteral calculi or hydronephrosis. Unremarkable unopacified urinary bladder. Stomach/Bowel: Mild diffuse wall thickening of the distal thoracic esophagus is unchanged and suspicious for esophagitis. No evidence of hiatal hernia. No evidence of obstruction, inflammatory process or abnormal fluid collections. Normal appendix visualized. Vascular/Lymphatic: No pathologically enlarged lymph nodes. No acute vascular findings. Aortic atherosclerotic calcification incidentally noted. Reproductive:  No mass or other significant abnormality. Other:  None. Musculoskeletal:  No suspicious bone lesions identified. IMPRESSION: No acute findings. Stable mild diffuse wall thickening of distal thoracic esophagus, suspicious for esophagitis. Consider endoscopy for further evaluation. Hepatic steatosis. Cholelithiasis. No radiographic evidence of cholecystitis. Aortic Atherosclerosis (ICD10-I70.0). Electronically Signed   By: JMyles RosenthalD.  On: 07/08/2022 15:00   DG Chest Portable 1 View  Result Date: 07/08/2022 CLINICAL DATA:  Nausea and vomiting.  Evaluate for free air. EXAM: PORTABLE CHEST 1 VIEW COMPARISON:  January 27, 2022 FINDINGS: The heart size and mediastinal contours are within normal limits. Both lungs are clear. No free air is noted. The visualized skeletal structures are stable. IMPRESSION: No active cardiopulmonary disease.  No free air is noted. Electronically Signed   By: Abelardo Diesel M.D.   On: 07/08/2022 14:05    Microbiology: Results for orders placed or performed during the hospital encounter of 07/08/22  Urine Culture     Status: Abnormal   Collection  Time: 07/08/22  5:16 PM   Specimen: Urine, Clean Catch  Result Value Ref Range Status   Specimen Description   Final    URINE, CLEAN CATCH Performed at [facility], [address] [place], [place] [address]    Special Requests   Final    NONE Performed at [facility], [address] [place], [place] [address]    Culture (A)  Final    40,000 COLONIES/mL AEROCOCCUS URINAE Standardized susceptibility testing for this organism is not available. Performed at [facility], [place] [address], [place], [place] [address]    Report Status 07/10/2022 FINAL  Final  Blood culture (routine x 2)     Status: None (Preliminary result)   Collection Time: 07/08/22  6:18 PM   Specimen: BLOOD  Result Value Ref Range Status   Specimen Description BLOOD BLOOD RIGHT ARM  Final   Special Requests   Final    BOTTLES DRAWN AEROBIC AND ANAEROBIC Blood Culture adequate volume   Culture   Final    NO GROWTH 2 DAYS Performed at [facility], [address] [place], [place] [address]    Report Status PENDING  Incomplete  Blood culture (routine x 2)     Status: None (Preliminary result)   Collection Time: 07/08/22  6:18 PM   Specimen: BLOOD  Result Value Ref Range Status   Specimen Description BLOOD BLOOD LEFT HAND  Final   Special Requests   Final    BOTTLES DRAWN AEROBIC AND ANAEROBIC Blood Culture adequate volume   Culture   Final    NO GROWTH 2 DAYS Performed at [facility], [address], [place], [place] [address]    Report Status PENDING  Incomplete  Resp Panel by RT-PCR (Flu A&B, Covid) Anterior Nasal Swab     Status: None   Collection Time: 07/08/22  6:18 PM   Specimen: Anterior Nasal Swab  Result Value Ref Range Status   SARS Coronavirus 2 by RT PCR NEGATIVE NEGATIVE Final    Comment: (NOTE) SARS-CoV-2 target nucleic acids are NOT DETECTED.  The SARS-CoV-2 RNA is generally detectable in upper respiratory specimens during the acute phase of  infection. The lowest concentration of SARS-CoV-2 viral copies this assay can detect is 138 copies/mL. A negative result does not preclude SARS-Cov-2 infection and should not be used as the sole basis for treatment or other patient management decisions. A negative result may occur with  improper specimen collection/handling, submission of specimen other than nasopharyngeal swab, presence of viral mutation(s) within the areas targeted by this assay, and inadequate number of viral copies(<138 copies/mL). A negative result must be combined with clinical observations, patient history, and epidemiological information. The expected result is Negative.  Fact Sheet for Patients:  EntrepreneurPulse.com.au  Fact Sheet for Healthcare Providers:  IncredibleEmployment.be  This test is no t yet approved or cleared by the [place] and  has been authorized for detection and/or diagnosis of SARS-CoV-2 by FDA under an Emergency Use Authorization (EUA). This EUA will remain  in effect (meaning this test can be used) for the duration of the COVID-19 declaration under Section 564(b)(1) of the Act, 21 U.S.C.section 360bbb-3(b)(1), unless the authorization is terminated  or revoked sooner.       Influenza A by PCR NEGATIVE NEGATIVE Final   Influenza B by PCR NEGATIVE NEGATIVE Final    Comment: (NOTE) The Xpert Xpress SARS-CoV-2/FLU/RSV plus assay is intended as an aid in the diagnosis of influenza from Nasopharyngeal swab specimens and should not be used as a sole basis for treatment. Nasal washings and aspirates are unacceptable for Xpert Xpress SARS-CoV-2/FLU/RSV testing.  Fact Sheet for Patients: EntrepreneurPulse.com.au  Fact Sheet for Healthcare Providers: IncredibleEmployment.be  This test is not yet approved or cleared by the [place] FDA and has been authorized for detection and/or diagnosis of SARS-CoV-2  by FDA under an Emergency Use Authorization (EUA). This EUA will remain in effect (meaning this test can be used) for the duration of the COVID-19 declaration under Section 564(b)(1) of the Act, 21 U.S.C. section 360bbb-3(b)(1), unless the authorization is terminated or revoked.  Performed at [facility], [address] [place], [place] [address]     Labs: CBC: Recent Labs  Lab 07/08/22 1138 07/09/22 0448  WBC 16.1* 14.3*  NEUTROABS  --  11.1*  HGB 17.1* 14.1  HCT 46.6 39.7  MCV 91.6 93.6  PLT 356 761   Basic Metabolic Panel: Recent Labs  Lab 07/08/22 1138 07/08/22 2222 07/09/22 0448 07/10/22 0414  NA 133* 138 138 140  K 3.7 3.4* 2.8* 3.4*  CL 93* 102 105 105  CO2 21* _0 GLUCOSE 546* 170* 145* 102*  BUN _1 7*  CREATININE 0.98 0.79 0.70 0.62  CALCIUM 8.0* 7.6* 7.8* 8.1*  MG  --   --  1.8  --    Liver Function Tests: Recent Labs  Lab 07/08/22 1138 07/09/22 0448  AST 29 19  ALT 17 15  ALKPHOS 143* 102  BILITOT 1.6* 2.0*  PROT 6.6 5.1*  ALBUMIN 2.8* 2.3*   CBG: Recent Labs  Lab 07/09/22 1635 07/09/22 2019 07/10/22 0010 07/10/22 0419 07/10/22 0859  GLUCAP 137* 106* 107* 125* 98    Discharge time spent: greater than 30 minutes.  This record has been created using Systems analyst. Errors have been sought and corrected,but may not always be located. Such creation errors do not reflect on the standard of care.   Signed: Lorella Nimrod, MD [facility] 07/10/2022

## 2022-07-10 NOTE — Inpatient Diabetes Management (Signed)
Inpatient Diabetes Program Recommendations  AACE/ADA: New Consensus Statement on Inpatient Glycemic Control  Target Ranges:  Prepandial:   less than 140 mg/dL      Peak postprandial:   less than 180 mg/dL (1-2 hours)      Critically ill patients:  140 - 180 mg/dL    Latest Reference Range & Units 07/09/22 07:45 07/09/22 11:58 07/09/22 16:35 07/09/22 20:19 07/10/22 00:10 07/10/22 04:19 07/10/22 08:59  Glucose-Capillary 70 - 99 mg/dL 110 (H) 162 (H) 137 (H) 106 (H) 107 (H) 125 (H) 98    Review of Glycemic Control  Diabetes history: DM2 Outpatient Diabetes medications: Semglee 35 units BID, Jardiance 25 mg daily, Glipizide 10 mg BID, Metformin XR 1000 mg BID Current orders for Inpatient glycemic control: Semglee 53 units daily, Novolog 0-15 units TID with meals, Novolog 0-5 units QHS, Novolog 4 units TID with meals  NOTE: Spoke with patient at length on 07/09/22 regarding DM and patient interested in using FreeStyle Libre3. Dr. Reesa Chew provided order to provide samples of FreeStyle Libre3. Patient was actually being discharged but was able to provide education on Libre3 and assist patient to apply the sensor before discharged. Educated patient on FreeStyle Libre3 CGM regarding application and changing CGM sensor (alternate every 14 days on back of arms), 1 hour warm-up, how to scan sensor to start a new sensor, and how to use app to check glucose.  Patient has been given Freestyle Libre3 sensor samples.  Provided educational packet regarding FreeStyle Libre3 CGM.  Patient plans to use android phone FreeStyle Libre3 app to read FreeStyle Libre sensor and was able to download the app and create an account. Assisted patient to apply sensor to back of left arm.  Informed patient that he would need to have PCP provide Rx for FreeStyle Libre3 sensors going forward. Asked patient to be sure to let PCP know about Maurine Simmering and allow provider to review reports from [place] app so the provider can use the information to  continue to make adjustments with DM medications if needed. Patient verbalized understanding of information and has no questions at this time.  Thanks, Barnie Alderman, RN, MSN, [facility] Coordinator Inpatient Diabetes Program [phone] (Team Pager from 8am to [place])

## 2022-07-10 NOTE — Progress Notes (Signed)
Discharge instructions reviewed with patient. He verbalized understanding of instructions. Belongings returned

## 2022-07-10 NOTE — Plan of Care (Signed)
Problem: Fluid Volume: Goal: Hemodynamic stability will improve Outcome: Adequate for Discharge   Problem: Clinical Measurements: Goal: Diagnostic test results will improve Outcome: Adequate for Discharge Goal: Signs and symptoms of infection will decrease Outcome: Adequate for Discharge   Problem: Respiratory: Goal: Ability to maintain adequate ventilation will improve Outcome: Adequate for Discharge   Problem: Education: Goal: Ability to describe self-care measures that may prevent or decrease complications (Diabetes Survival Skills Education) will improve Outcome: Adequate for Discharge Goal: Individualized Educational Video(s) Outcome: Adequate for Discharge   Problem: Coping: Goal: Ability to adjust to condition or change in health will improve Outcome: Adequate for Discharge   Problem: Fluid Volume: Goal: Ability to maintain a balanced intake and output will improve Outcome: Adequate for Discharge   Problem: Health Behavior/Discharge Planning: Goal: Ability to identify and utilize available resources and services will improve Outcome: Adequate for Discharge Goal: Ability to manage health-related needs will improve Outcome: Adequate for Discharge   Problem: Metabolic: Goal: Ability to maintain appropriate glucose levels will improve Outcome: Adequate for Discharge   Problem: Nutritional: Goal: Maintenance of adequate nutrition will improve Outcome: Adequate for Discharge Goal: Progress toward achieving an optimal weight will improve Outcome: Adequate for Discharge   Problem: Skin Integrity: Goal: Risk for impaired skin integrity will decrease Outcome: Adequate for Discharge   Problem: Tissue Perfusion: Goal: Adequacy of tissue perfusion will improve Outcome: Adequate for Discharge   Problem: Education: Goal: Ability to describe self-care measures that may prevent or decrease complications (Diabetes Survival Skills Education) will improve Outcome: Adequate  for Discharge Goal: Individualized Educational Video(s) Outcome: Adequate for Discharge   Problem: Cardiac: Goal: Ability to maintain an adequate cardiac output will improve Outcome: Adequate for Discharge   Problem: Health Behavior/Discharge Planning: Goal: Ability to identify and utilize available resources and services will improve Outcome: Adequate for Discharge Goal: Ability to manage health-related needs will improve Outcome: Adequate for Discharge   Problem: Fluid Volume: Goal: Ability to achieve a balanced intake and output will improve Outcome: Adequate for Discharge   Problem: Metabolic: Goal: Ability to maintain appropriate glucose levels will improve Outcome: Adequate for Discharge   Problem: Nutritional: Goal: Maintenance of adequate nutrition will improve Outcome: Adequate for Discharge Goal: Maintenance of adequate weight for body size and type will improve Outcome: Adequate for Discharge   Problem: Respiratory: Goal: Will regain and/or maintain adequate ventilation Outcome: Adequate for Discharge   Problem: Urinary Elimination: Goal: Ability to achieve and maintain adequate renal perfusion and functioning will improve Outcome: Adequate for Discharge   Problem: Education: Goal: Knowledge of General Education information will improve Description: Including pain rating scale, medication(s)/side effects and non-pharmacologic comfort measures Outcome: Adequate for Discharge   Problem: Health Behavior/Discharge Planning: Goal: Ability to manage health-related needs will improve Outcome: Adequate for Discharge   Problem: Clinical Measurements: Goal: Ability to maintain clinical measurements within normal limits will improve Outcome: Adequate for Discharge Goal: Will remain free from infection Outcome: Adequate for Discharge Goal: Diagnostic test results will improve Outcome: Adequate for Discharge Goal: Respiratory complications will improve Outcome:  Adequate for Discharge Goal: Cardiovascular complication will be avoided Outcome: Adequate for Discharge   Problem: Activity: Goal: Risk for activity intolerance will decrease Outcome: Adequate for Discharge   Problem: Nutrition: Goal: Adequate nutrition will be maintained Outcome: Adequate for Discharge   Problem: Coping: Goal: Level of anxiety will decrease Outcome: Adequate for Discharge   Problem: Elimination: Goal: Will not experience complications related to bowel motility Outcome: Adequate for Discharge Goal: Will  not experience complications related to urinary retention Outcome: Adequate for Discharge   Problem: Pain Managment: Goal: General experience of comfort will improve Outcome: Adequate for Discharge   Problem: Safety: Goal: Ability to remain free from injury will improve Outcome: Adequate for Discharge   Problem: Skin Integrity: Goal: Risk for impaired skin integrity will decrease Outcome: Adequate for Discharge

## 2022-07-13 LAB — CULTURE, BLOOD (ROUTINE X 2)
Culture: NO GROWTH
Culture: NO GROWTH
Special Requests: ADEQUATE
Special Requests: ADEQUATE

## 2022-09-04 NOTE — Progress Notes (Unsigned)
New patient visit   Patient: Corey Petersen   DOB: 1961/06/29   62 y.o. Male  MRN: [id number] Visit Date: 09/05/2022  Today's healthcare provider: Mikey Kirschner, PA-C   No chief complaint on file.  Subjective    Corey Petersen is a 62 y.o. male who presents today as a new patient to establish care.  HPI  ***  Past Medical History:  Diagnosis Date   Diabetes mellitus without complication ([place])    Hypertension    Past Surgical History:  Procedure Laterality Date   COLONOSCOPY N/A 03/09/2022   Procedure: COLONOSCOPY;  Surgeon: Lucilla Lame, MD;  [facility] ENDOSCOPY;  Service: Endoscopy;  Laterality: N/A;   ESOPHAGOGASTRODUODENOSCOPY N/A 03/09/2022   Procedure: ESOPHAGOGASTRODUODENOSCOPY (EGD);  Surgeon: Lucilla Lame, MD;  Location: [facility] ENDOSCOPY;  Service: Endoscopy;  Laterality: N/A;   HERNIA REPAIR     Family Status  Relation Name Status   Mother  (Not Specified)   Family History  Problem Relation Age of Onset   Hypertension Mother    Social History   Socioeconomic History   Marital status: Married    Spouse name: Not on file   Number of children: Not on file   Years of education: Not on file   Highest education level: Not on file  Occupational History   Not on file  Tobacco Use   Smoking status: Never   Smokeless tobacco: Never  Vaping Use   Vaping Use: Never used  Substance and Sexual Activity   Alcohol use: Never   Drug use: Never   Sexual activity: Not Currently  Other Topics Concern   Not on file  Social History Narrative   Not on file   Social Determinants of Health   Financial Resource Strain: Not on file  Food Insecurity: No Food Insecurity (07/09/2022)   Hunger Vital Sign    Worried About Running Out of Food in the Last Year: Never true    Ran Out of Food in the Last Year: Never true  Transportation Needs: No Transportation Needs (07/09/2022)   PRAPARE - Hydrologist (Medical): No    Lack of Transportation  (Non-Medical): No  Physical Activity: Not on file  Stress: Not on file  Social Connections: Not on file   Outpatient Medications Prior to Visit  Medication Sig   amLODipine (NORVASC) 10 MG tablet Take 1 tablet by mouth daily.   atorvastatin (LIPITOR) 80 MG tablet TAKE ONE-HALF TABLET BY MOUTH AT BEDTIME FOR CHOLESTEROL   baclofen (LIORESAL) 10 MG tablet Take 1 tablet (10 mg total) by mouth daily as needed (Hiccups).   bismuth subsalicylate (PEPTO BISMOL) 262 MG chewable tablet CHEW ONE TABLET BY MOUTH FOUR TIMES A DAY H.PYLORI ERADICATION   calcium carbonate (TUMS - DOSED IN MG ELEMENTAL CALCIUM) 500 MG chewable tablet Chew 1 tablet (200 mg of elemental calcium total) by mouth 3 (three) times daily with meals.   celecoxib (CELEBREX) 200 MG capsule Take 200 mg by mouth 2 (two) times daily.   DULoxetine (CYMBALTA) 30 MG capsule TAKE ONE CAPSULE BY MOUTH AT BEDTIME FOR 7 DAYS, THEN TAKE TWO CAPSULES AT BEDTIME FOR HICCUPS EXACERBATED BY ANXIETY   empagliflozin (JARDIANCE) 25 MG TABS tablet Take 25 mg by mouth daily.   famotidine (PEPCID) 20 MG tablet Take 1 tablet (20 mg total) by mouth daily.   glipiZIDE (GLUCOTROL) 10 MG tablet Take 10 mg by mouth 2 (two) times daily before a meal.   insulin glargine-yfgn (SEMGLEE)  100 UNIT/ML injection 70 Units at bedtime.   losartan (COZAAR) 100 MG tablet Take 1 tablet by mouth daily.   metFORMIN (GLUCOPHAGE-XR) 500 MG 24 hr tablet TAKE TWO TABLETS BY MOUTH TWO TIMES A DAY FOR DIABETES NOTE DOSE INCREASE   metoCLOPramide (REGLAN) 5 MG tablet Take 1 tablet (5 mg total) by mouth 3 (three) times daily before meals.   metoprolol succinate (TOPROL-XL) 50 MG 24 hr tablet Take 25 mg by mouth daily.   pantoprazole (PROTONIX) 20 MG tablet Take 1 tablet (20 mg total) by mouth 2 (two) times daily.   tamsulosin (FLOMAX) 0.4 MG CAPS capsule Take 0.4 mg by mouth daily.   No facility-administered medications prior to visit.   Allergies  Allergen Reactions   Ace  Inhibitors Cough     There is no immunization history on file for this patient.  Health Maintenance  Topic Date Due   Medicare Annual Wellness (AWV)  Never done   FOOT EXAM  Never done   OPHTHALMOLOGY EXAM  Never done   Diabetic kidney evaluation - Urine ACR  Never done   Hepatitis C Screening  Never done   DTaP/Tdap/Td (1 - Tdap) Never done   Zoster Vaccines- Shingrix (2 of 2) 10/03/2021   COVID-19 Vaccine (4 - 2023-24 season) 04/06/2022   HEMOGLOBIN A1C  01/07/2023   Diabetic kidney evaluation - eGFR measurement  07/11/2023   COLONOSCOPY (Pts 45-17yr Insurance coverage will need to be confirmed)  03/10/2027   INFLUENZA VACCINE  Completed   HIV Screening  Completed   HPV VACCINES  Aged Out    Patient Care Team: [facility] PCP - General (General Practice)  Review of Systems  {Labs  Heme  Chem  Endocrine  Serology  Results Review (optional):23779}   Objective    There were no vitals taken for this visit. {Show previous vital signs (optional):23777}  Physical Exam ***  Depression Screen     No data to display         No results found for any visits on 09/05/22.  Assessment & Plan     ***  No follow-ups on file.     {provider attestation***:1}   LMikey Kirschner PA-C  [phone](phone) [phone](fax)  [place]

## 2022-09-05 ENCOUNTER — Ambulatory Visit (INDEPENDENT_AMBULATORY_CARE_PROVIDER_SITE_OTHER): Payer: Medicare Other | Admitting: Physician Assistant

## 2022-09-05 ENCOUNTER — Encounter: Payer: Self-pay | Admitting: Physician Assistant

## 2022-09-05 ENCOUNTER — Telehealth: Payer: Self-pay

## 2022-09-05 VITALS — BP 140/86 | HR 104 | Temp 98.2°F | Ht 66.0 in | Wt 215.7 lb

## 2022-09-05 DIAGNOSIS — R202 Paresthesia of skin: Secondary | ICD-10-CM

## 2022-09-05 DIAGNOSIS — K3184 Gastroparesis: Secondary | ICD-10-CM

## 2022-09-05 DIAGNOSIS — R109 Unspecified abdominal pain: Secondary | ICD-10-CM | POA: Insufficient documentation

## 2022-09-05 DIAGNOSIS — N5089 Other specified disorders of the male genital organs: Secondary | ICD-10-CM | POA: Insufficient documentation

## 2022-09-05 DIAGNOSIS — R079 Chest pain, unspecified: Secondary | ICD-10-CM

## 2022-09-05 DIAGNOSIS — E1169 Type 2 diabetes mellitus with other specified complication: Secondary | ICD-10-CM | POA: Diagnosis not present

## 2022-09-05 DIAGNOSIS — E785 Hyperlipidemia, unspecified: Secondary | ICD-10-CM

## 2022-09-05 DIAGNOSIS — Z794 Long term (current) use of insulin: Secondary | ICD-10-CM

## 2022-09-05 DIAGNOSIS — E876 Hypokalemia: Secondary | ICD-10-CM

## 2022-09-05 DIAGNOSIS — R200 Anesthesia of skin: Secondary | ICD-10-CM

## 2022-09-05 DIAGNOSIS — B9681 Helicobacter pylori [H. pylori] as the cause of diseases classified elsewhere: Secondary | ICD-10-CM | POA: Insufficient documentation

## 2022-09-05 DIAGNOSIS — I152 Hypertension secondary to endocrine disorders: Secondary | ICD-10-CM

## 2022-09-05 DIAGNOSIS — E1159 Type 2 diabetes mellitus with other circulatory complications: Secondary | ICD-10-CM | POA: Diagnosis not present

## 2022-09-05 DIAGNOSIS — E119 Type 2 diabetes mellitus without complications: Secondary | ICD-10-CM | POA: Diagnosis not present

## 2022-09-05 DIAGNOSIS — E1142 Type 2 diabetes mellitus with diabetic polyneuropathy: Secondary | ICD-10-CM

## 2022-09-05 DIAGNOSIS — K5900 Constipation, unspecified: Secondary | ICD-10-CM

## 2022-09-05 DIAGNOSIS — K2970 Gastritis, unspecified, without bleeding: Secondary | ICD-10-CM

## 2022-09-05 MED ORDER — METFORMIN HCL ER 500 MG PO TB24: ORAL_TABLET | ORAL | 1 refills | Status: DC

## 2022-09-05 MED ORDER — PANTOPRAZOLE SODIUM 20 MG PO TBEC: 20.0000 mg | DELAYED_RELEASE_TABLET | Freq: Two times a day (BID) | ORAL | 0 refills | Status: DC

## 2022-09-05 MED ORDER — GLIPIZIDE 10 MG PO TABS: 10.0000 mg | ORAL_TABLET | Freq: Two times a day (BID) | ORAL | 1 refills | Status: DC

## 2022-09-05 MED ORDER — LOSARTAN POTASSIUM 100 MG PO TABS: 100.0000 mg | ORAL_TABLET | Freq: Every day | ORAL | 1 refills | Status: DC

## 2022-09-05 MED ORDER — EMPAGLIFLOZIN 25 MG PO TABS: 25.0000 mg | ORAL_TABLET | Freq: Every day | ORAL | 1 refills | Status: DC

## 2022-09-05 MED ORDER — ASPIRIN 81 MG PO TBEC: 81.0000 mg | DELAYED_RELEASE_TABLET | Freq: Every day | ORAL | 12 refills | Status: AC

## 2022-09-05 NOTE — Assessment & Plan Note (Signed)
Will need dm foot exam next visit.  On visual overview no ulcerations present .refer to podiatry proactively

## 2022-09-05 NOTE — Assessment & Plan Note (Signed)
Referring back to gastroenterology given other symptoms that are present

## 2022-09-05 NOTE — Assessment & Plan Note (Signed)
Unable to feel during exam today.  Will send for scrotal ultrasound

## 2022-09-05 NOTE — Assessment & Plan Note (Signed)
Unclear etiology referring to cardiology for potential cardiac etiology.  See chest pain for EKG

## 2022-09-05 NOTE — Assessment & Plan Note (Signed)
Last A1c 07/08/2022 was 8.7%. Managed with Jardiance 25 mg, glipizide 10 mg twice daily metformin 500 mg extended release twice daily? Also manages to 70 units of insulin at bedtime. Given complexity and uncontrolled nature of his diabetes will refer to endocrinology, knowing wait times for this specialty referring to pharmacist in our office for additional help managing his condition Patient seems to have some diabetic neuropathy deferred foot exam today due to complexity of visit Patient unable to void for UACR

## 2022-09-05 NOTE — Assessment & Plan Note (Signed)
This is the working diagnosis in the hospital for his ongoing gastrointestinal symptoms.  Given this and potentially untreated?  H. pylori referring back to gastroenterology

## 2022-09-05 NOTE — Assessment & Plan Note (Signed)
Systolic elevated in office. Managed with amlodipine 10 mg, losartan 100 mg.  Metoprolol is on chart but patient states he has not taken.  Not present in the medication bag he brings with him today Given associated potential cardiac symptoms and unspecified cardiac history referring to cardiology.

## 2022-09-05 NOTE — Progress Notes (Signed)
  Chronic Care Management   Note  09/05/2022 Name: Corey Petersen MRN: [id number] DOB: 1961-04-19  Corey Petersen is a 62 y.o. year old male who is a primary care patient of Simmons-Robinson, Riki Sheer, MD. I reached out to [employer] by phone today in response to a referral sent by Corey Petersen PCP.  Corey Petersen was given information about Chronic Care Management services today including:  CCM service includes personalized support from designated clinical staff supervised by the physician, including individualized plan of care and coordination with other care providers 24/7 contact phone numbers for assistance for urgent and routine care needs. Service will only be billed when office clinical staff spend 20 minutes or more in a month to coordinate care. Only one practitioner may furnish and bill the service in a calendar month. The patient may stop CCM services at amy time (effective at the end of the month) by phone call to the office staff. The patient will be responsible for cost sharing (co-pay) or up to 20% of the service fee (after annual deductible is met)  Corey Petersen  agreedto scheduling an appointment with the CCM RN Case Manager   Follow up plan: Patient agreed to scheduled appointment with RN Case Manager on 09/12/2022(date/time).   Noreene Larsson, [place], [place] [address] Direct Dial: [phone] [email or web address]

## 2022-09-05 NOTE — Assessment & Plan Note (Signed)
Referring to GI, could be related to gastroparesis that has been suspected.  In the meantime advise trying MiraLAX over-the-counter

## 2022-09-05 NOTE — Assessment & Plan Note (Signed)
Manages with atorvastatin 80 mg. Advising he start ASA 81 mg coated tablet in the morning

## 2022-09-05 NOTE — Assessment & Plan Note (Signed)
EKG today with no ST elevation or depression.  Some T wave inversion and PAC versus PVC present--these are also audible on exam Referring to cardiology.  Patient given ED precautions

## 2022-09-05 NOTE — Assessment & Plan Note (Signed)
Will repeat CMP

## 2022-09-05 NOTE — Progress Notes (Signed)
   Care Guide Note  09/05/2022 Name: Cloy Cozzens MRN: [id number] DOB: Jan 02, 1961  Referred by: Eulis Foster, MD Reason for referral : Chronic Care Management (Outreach to schedule referral )   Nayquan Evinger is a 62 y.o. year old male who is a primary care patient of Simmons-Robinson, Riki Sheer, MD. Alie Hardgrove was referred to the pharmacist for assistance related to DM.    Successful contact was made with the patient to discuss pharmacy services including being ready for the pharmacist to call at least 5 minutes before the scheduled appointment time, to have medication bottles and any blood sugar or blood pressure readings ready for review. The patient agreed to meet with the pharmacist via with the pharmacist via telephone visit on (date/time).  09/19/2022  Noreene Larsson, [place], [place] [address] Direct Dial: [phone] [email or web address]

## 2022-09-06 LAB — MICROALBUMIN / CREATININE URINE RATIO

## 2022-09-07 LAB — COMPREHENSIVE METABOLIC PANEL
ALT: 14 IU/L (ref 0–44)
AST: 14 IU/L (ref 0–40)
Albumin/Globulin Ratio: 1.2 (ref 1.2–2.2)
Albumin: 3.6 g/dL — ABNORMAL LOW (ref 3.9–4.9)
Alkaline Phosphatase: 121 IU/L (ref 44–121)
BUN/Creatinine Ratio: 12 (ref 10–24)
BUN: 12 mg/dL (ref 8–27)
Bilirubin Total: 1.3 mg/dL — ABNORMAL HIGH (ref 0.0–1.2)
CO2: 24 mmol/L (ref 20–29)
Calcium: 8.9 mg/dL (ref 8.6–10.2)
Chloride: 100 mmol/L (ref 96–106)
Creatinine, Ser: 0.98 mg/dL (ref 0.76–1.27)
Globulin, Total: 3.1 g/dL (ref 1.5–4.5)
Glucose: 184 mg/dL — ABNORMAL HIGH (ref 70–99)
Potassium: 4 mmol/L (ref 3.5–5.2)
Sodium: 141 mmol/L (ref 134–144)
Total Protein: 6.7 g/dL (ref 6.0–8.5)
eGFR: 88 mL/min/{1.73_m2} (ref >59)

## 2022-09-07 LAB — CBC WITH DIFFERENTIAL/PLATELET
Basophils Absolute: 0 10*3/uL (ref 0.0–0.2)
Basos: 1 %
EOS (ABSOLUTE): 0.1 10*3/uL (ref 0.0–0.4)
Eos: 2 %
Hematocrit: 49.9 % (ref 37.5–51.0)
Hemoglobin: 17 g/dL (ref 13.0–17.7)
Immature Grans (Abs): 0 10*3/uL (ref 0.0–0.1)
Immature Granulocytes: 0 %
Lymphocytes Absolute: 3 10*3/uL (ref 0.7–3.1)
Lymphs: 40 %
MCH: 33.2 pg — ABNORMAL HIGH (ref 26.6–33.0)
MCHC: 34.1 g/dL (ref 31.5–35.7)
MCV: 98 fL — ABNORMAL HIGH (ref 79–97)
Monocytes Absolute: 0.5 10*3/uL (ref 0.1–0.9)
Monocytes: 6 %
Neutrophils Absolute: 3.7 10*3/uL (ref 1.4–7.0)
Neutrophils: 51 %
Platelets: 272 10*3/uL (ref 150–450)
RBC: 5.12 x10E6/uL (ref 4.14–5.80)
RDW: 11.3 % — ABNORMAL LOW (ref 11.6–15.4)
WBC: 7.4 10*3/uL (ref 3.4–10.8)

## 2022-09-07 LAB — MICROALBUMIN / CREATININE URINE RATIO

## 2022-09-07 LAB — SPECIMEN STATUS REPORT

## 2022-09-12 ENCOUNTER — Ambulatory Visit (INDEPENDENT_AMBULATORY_CARE_PROVIDER_SITE_OTHER): Payer: Medicare Other

## 2022-09-12 DIAGNOSIS — E1159 Type 2 diabetes mellitus with other circulatory complications: Secondary | ICD-10-CM

## 2022-09-12 DIAGNOSIS — E119 Type 2 diabetes mellitus without complications: Secondary | ICD-10-CM

## 2022-09-12 NOTE — Plan of Care (Signed)
Chronic Care Management Provider Comprehensive Care Plan    09/12/2022 Name: Corey Petersen MRN: [id number] DOB: 1961-02-06  Referral to Chronic Care Management (CCM) services was placed by Provider:  Sharen Counter on Date: 09/05/22.   Chronic Condition 1: Diabetes Provider Assessment and Plan  Insulin dependent type 2 diabetes mellitus (HCC)       Last A1c 07/08/2022 was 8.7%. Managed with Jardiance 25 mg, glipizide 10 mg twice daily metformin 500 mg extended release twice daily? Also manages to 70 units of insulin at bedtime. Given complexity and uncontrolled nature of his diabetes will refer to endocrinology, knowing wait times for this specialty referring to pharmacist in our office for additional help managing his condition Patient seems to have some diabetic neuropathy deferred foot exam today due to complexity of visit Patient unable to void for UACR        Relevant Medications    losartan (COZAAR) 100 MG tablet    glipiZIDE (GLUCOTROL) 10 MG tablet    metFORMIN (GLUCOPHAGE-XR) 500 MG 24 hr tablet    empagliflozin (JARDIANCE) 25 MG TABS tablet    aspirin EC 81 MG tablet    Other Relevant Orders    Expected Outcome/Goals Addressed This Visit (Provider CCM goals/Provider Assessment and plan  Goal: CCM (Diabetes) Expected Outcome:  Monitor, Self-Manage And Reduce Symptoms of Diabetes  Symptom Management Condition 1: Attend all scheduled provider appointments Monitor blood glucose and maintain a log Check feet daily for cuts, sores or redness Wash and dry feet carefully every day Wear comfortable, cotton socks Wear comfortable, well-fitting shoes Participate in prescribed exercise program Read food labels for fat, fiber, carbohydrates and portion size Seek medical attention for significantly elevated blood sugars Schedule an Eye Exam Call provider office for new concerns or questions      Chronic Condition 2: Hypertension Provider Assessment and Plan  Hypertension  associated with diabetes ([place]) - Primary       Systolic elevated in office. Managed with amlodipine 10 mg, losartan 100 mg.  Metoprolol is on chart but patient states he has not taken.  Not present in the medication bag he brings with him today Given associated potential cardiac symptoms and unspecified cardiac history referring to cardiology.        Relevant Medications    losartan (COZAAR) 100 MG tablet    glipiZIDE (GLUCOTROL) 10 MG tablet    metFORMIN (GLUCOPHAGE-XR) 500 MG 24 hr tablet    empagliflozin (JARDIANCE) 25 MG TABS tablet    aspirin EC 81 MG tablet   Expected Outcome/Goals Addressed This Visit (Provider CCM goals/Provider Assessment and plan  Goal: CCM (Hypertension) Expected Outcome:  Monitor, Self-Manage And Reduce Symptoms of Hypertension  Symptom Management Condition 2: Take all medications as prescribed Attend all scheduled provider appointments Call pharmacy for medication refills 3-7 days in advance of running out of medications Check blood pressure  Keep a blood pressure log Call doctor for signs and symptoms of high blood pressure Continue engaging in exercises and mild activity as tolerated Read nutrition labels. Eat whole grains, fruits and vegetables, lean meats and healthy fats Limit salt intake  Call provider office for new concerns or questions   Problem List Patient Active Problem List   Diagnosis Date Noted   Gastroparesis 09/05/2022   Hypokalemia [id number]   Gastritis, Helicobacter pylori [id number]   Chest pain 09/05/2022   Diabetic polyneuropathy associated with type 2 diabetes mellitus ([place]) 09/05/2022   Scrotal mass 09/05/2022   Constipation 09/05/2022   Numbness and  tingling in left hand 09/05/2022   DKA, type 2 ([place]) 07/09/2022   Severe sepsis with acute organ dysfunction ([place]) 07/08/2022   Hyperglycemia due to type 2 diabetes mellitus ([place]) 07/08/2022   Elevated liver enzymes 07/08/2022   Belching 07/08/2022   Loss of weight     Dyspepsia    Diabetes mellitus ([place]) 02/13/2022   Hyperlipidemia associated with type 2 diabetes mellitus ([place]) 02/13/2022   Vitamin D deficiency 02/13/2022   Sleep apnea 02/13/2022   Insulin dependent type 2 diabetes mellitus ([place]) 02/13/2022   Abnormal liver function 02/13/2022   Hypertension associated with diabetes ([place]) 02/13/2022    Medication Management  Current Outpatient Medications:    amLODipine (NORVASC) 10 MG tablet, Take 1 tablet by mouth daily., Disp: , Rfl:    aspirin EC 81 MG tablet, Take 1 tablet (81 mg total) by mouth daily. Swallow whole., Disp: 30 tablet, Rfl: 12   atorvastatin (LIPITOR) 80 MG tablet, TAKE ONE-HALF TABLET BY MOUTH AT BEDTIME FOR CHOLESTEROL, Disp: , Rfl:    calcium carbonate (TUMS - DOSED IN MG ELEMENTAL CALCIUM) 500 MG chewable tablet, Chew 1 tablet (200 mg of elemental calcium total) by mouth 3 (three) times daily with meals., Disp: 90 tablet, Rfl: 1   empagliflozin (JARDIANCE) 25 MG TABS tablet, Take 1 tablet (25 mg total) by mouth daily., Disp: 90 tablet, Rfl: 1   glipiZIDE (GLUCOTROL) 10 MG tablet, Take 1 tablet (10 mg total) by mouth 2 (two) times daily before a meal., Disp: 180 tablet, Rfl: 1   losartan (COZAAR) 100 MG tablet, Take 1 tablet (100 mg total) by mouth daily., Disp: 90 tablet, Rfl: 1   metFORMIN (GLUCOPHAGE-XR) 500 MG 24 hr tablet, TAKE TWO TABLETS BY MOUTH TWO TIMES A DAY FOR DIABETES NOTE DOSE INCREASE, Disp: 180 tablet, Rfl: 1   pantoprazole (PROTONIX) 20 MG tablet, Take 1 tablet (20 mg total) by mouth 2 (two) times daily., Disp: 60 tablet, Rfl: 0   bismuth subsalicylate (PEPTO BISMOL) 262 MG chewable tablet, CHEW ONE TABLET BY MOUTH FOUR TIMES A DAY H.PYLORI ERADICATION (Patient not taking: Reported on 09/12/2022), Disp: , Rfl:    insulin glargine-yfgn (SEMGLEE) 100 UNIT/ML injection, 70 Units at bedtime., Disp: , Rfl:   Cognitive Assessment Identity Confirmed: : Name; DOB Cognitive Status: Normal   Functional  Assessment Hearing Difficulty or Deaf: no Wear Glasses or Blind: yes Vision Management: Wears Glasses Concentrating, Remembering or Making Decisions Difficulty (CP): no Difficulty Communicating: no Difficulty Eating/Swallowing: no Walking or Climbing Stairs Difficulty: yes Walking or Climbing Stairs: ambulation difficulty, requires equipment Mobility Management: Uses Cane Dressing/Bathing Difficulty: no Doing Errands Independently Difficulty (such as shopping) (CP): no Change in Functional Status Since Onset of Current Illness/Injury: no   Caregiver Assessment  Primary Source of Support/Comfort: community; spouse Name of Support/Comfort Primary Source: Surveyor, minerals People in Home: spouse Family Caregiver if Needed: none Primary Roles/Responsibilities: disabled   Planned Interventions  Diabetes Reviewed provider's plan for diabetes management.  Reviewed medications. Spouse reports patient is taking oral medications and administering insulin as prescribed. Discussed importance of consistent blood glucose readings. Discuss options for continuous glucose monitoring device. Currently prefers to use a standard glucometer. Advise to monitor consistently and maintain a log. Provided information regarding hypoglycemia and hyperglycemia along with appropriate interventions. Denies hypoglycemic episodes. Reports episodes of hyperglycemia with readings between 300 and 400. Thorough discussion regarding s/sx of hyperglycemic crisis and need for immediate medical attention.  Discussed nutritional intake and importance of complying with a diabetic diet.  Advised to increase consumption of fruits, vegetables, and lean proteins. Advised to monitor intake of carbohydrates and avoid foods and beverages with added sugar when possible. Discussed options for additional diabetic/nutritional education. Agreeable to considering referrals if readings do not improve. Discussed importance of completing  recommended DM preventive care. Reports completing daily foot care as advised. Routine foot care and exams currently being performed by Podiatry/Dr. Milinda Pointer. Reports last eye exam was approximately 5 years ago. Advised to contact Optometry as soon as possible to schedule an eye exam.  Discussed importance of completing ordered labs as prescribed.  Assessed social determinant of health barriers.   Hypertension Reviewed current treatment plan related to Hypertension, self-management, and adherence to plan as established by provider.  Reviewed medications and indications for use. Spouse reports patient is taking medications as prescribed. Agreed to update team with concerns r/t medication management or prescription cost. Provided information regarding established blood pressure parameters along with indications for notifying a provider. Spouse reports BP is being monitored routinely. Advised to monitor BP at least a few times a week and maintain a BP log. Reviewed symptoms. Denies chest pain or palpitations. Denies headaches, dizziness, or visual changes.  Discussed compliance with recommended cardiac prudent diet. Encouraged to read nutrition labels, continue monitoring sodium intake, and avoid highly processed foods when possible.  Reviewed risks and complications of elevated BP. Reviewed s/sx of heart attack, stroke and worsening symptoms that require immediate medical attention.    Interaction and coordination with outside resources, practitioners, and providers See CCM Referral  Care Plan: Available in MyChart

## 2022-09-12 NOTE — Patient Instructions (Addendum)
Thank you for allowing the Chronic Care Management team to participate in your care. It was great speaking with you!   Following is a copy of the CCM Program Consent:  CCM service includes personalized support from designated clinical staff supervised by the physician, including individualized plan of care and coordination with other care providers 24/7 contact phone numbers for assistance for urgent and routine care needs. Service will only be billed when office clinical staff spend 20 minutes or more in a month to coordinate care. Only one practitioner may furnish and bill the service in a calendar month. The patient may stop CCM services at amy time (effective at the end of the month) by phone call to the office staff. The patient will be responsible for cost sharing (co-pay) or up to 20% of the service fee (after annual deductible is met)  Following is a copy of your full provider care plan:    Goals Addressed             This Visit's Progress    Goal: CCM (Diabetes) Expected Outcome:  Monitor, Self-Manage And Reduce Symptoms of Diabetes       Current Barriers:  Chronic Disease Management support and education needs related to Diabetes  Planned Interventions: Reviewed provider's plan for diabetes management.  Reviewed medications. Spouse reports patient is taking oral medications and administering insulin as prescribed. Discussed importance of consistent blood glucose readings. Discuss options for continuous glucose monitoring device. Currently prefers to use a standard glucometer. Advise to monitor consistently and maintain a log. Provided information regarding hypoglycemia and hyperglycemia along with appropriate interventions. Denies hypoglycemic episodes. Reports episodes of hyperglycemia with readings between 300 and 400. Thorough discussion regarding s/sx of hyperglycemic crisis and need for immediate medical attention.  Discussed nutritional intake and importance of complying  with a diabetic diet. Advised to increase consumption of fruits, vegetables, and lean proteins. Advised to monitor intake of carbohydrates and avoid foods and beverages with added sugar when possible. Discussed options for additional diabetic/nutritional education. Agreeable to considering referrals if readings do not improve. Discussed importance of completing recommended DM preventive care. Reports completing daily foot care as advised. Routine foot care and exams currently being performed by Podiatry/Dr. Milinda Pointer. Reports last eye exam was approximately 5 years ago. Advised to contact Optometry as soon as possible to schedule an eye exam.  Discussed importance of completing ordered labs as prescribed.  Assessed social determinant of health barriers.   Lab Results  Component Value Date   HGBA1C 8.7 (H) 07/08/2022    Symptom Management: Attend all scheduled provider appointments Monitor blood glucose and maintain a log Check feet daily for cuts, sores or redness Wash and dry feet carefully every day Wear comfortable, cotton socks Wear comfortable, well-fitting shoes Participate in prescribed exercise program Read food labels for fat, fiber, carbohydrates and portion size Seek medical attention for significantly elevated blood sugars Schedule an Eye Exam Call provider office for new concerns or questions   Follow Up Plan:  Will follow up within the next month          Goal: CCM (Hypertension) Expected Outcome:  Monitor, Self-Manage And Reduce Symptoms of Hypertension       Current Barriers:  Chronic Disease Management support and education needs related to Hypertension  Planned Interventions: Reviewed current treatment plan related to Hypertension, self-management, and adherence to plan as established by provider.  Reviewed medications and indications for use. Spouse reports patient is taking medications as prescribed. Agreed to update team  with concerns r/t medication management or  prescription cost. Provided information regarding established blood pressure parameters along with indications for notifying a provider. Spouse reports BP is being monitored routinely. Advised to monitor BP at least a few times a week and maintain a BP log. Reviewed symptoms. Denies chest pain or palpitations. Denies headaches, dizziness, or visual changes.  Discussed compliance with recommended cardiac prudent diet. Encouraged to read nutrition labels, continue monitoring sodium intake, and avoid highly processed foods when possible.  Reviewed risks and complications of elevated BP. Reviewed s/sx of heart attack, stroke and worsening symptoms that require immediate medical attention.    BP Readings from Last 3 Encounters:  09/05/22 (!) 140/86  07/10/22 (!) 164/90  03/09/22 (!) 138/91    Symptom Management: Take all medications as prescribed Attend all scheduled provider appointments Call pharmacy for medication refills 3-7 days in advance of running out of medications Check blood pressure  Keep a blood pressure log Call doctor for signs and symptoms of high blood pressure Continue engaging in exercises and mild activity as tolerated Read nutrition labels. Eat whole grains, fruits and vegetables, lean meats and healthy fats Limit salt intake  Call provider office for new concerns or questions     Follow Up Plan:   Will follow up within the next month.             [name] understanding of instructions and care plan provided. Agrees to view in MyChart.   A member of the care management team will follow up within the next month     [place] Manager/Chronic Care Management [phone]

## 2022-09-12 NOTE — Chronic Care Management (AMB) (Signed)
Chronic Care Management   CCM RN Visit Note  09/12/2022 Name: Corey Petersen MRN: [id number] DOB: February 02, 1961  Subjective: Corey Petersen is a 62 y.o. year old male who is a primary care patient of Simmons-Robinson, [place], MD. The patient was referred to the Chronic Care Management team for assistance with care management needs subsequent to provider initiation of CCM services and plan of care.    Today's Visit:  Engaged with patient by telephone for initial visit.     SDOH Interventions Today    Flowsheet Row Most Recent Value  SDOH Interventions   Food Insecurity Interventions Intervention Not Indicated  Housing Interventions Intervention Not Indicated  Transportation Interventions Intervention Not Indicated  Utilities Interventions Intervention Not Indicated  Alcohol Usage Interventions Intervention Not Indicated (Score <7)  Financial Strain Interventions Intervention Not Indicated  Physical Activity Interventions Other (Comments)  [Ability exercise limited d/t mobility]  Stress Interventions Intervention Not Indicated  Social Connections Interventions Intervention Not Indicated        Goals Addressed             This Visit's Progress    Goal: CCM (Diabetes) Expected Outcome:  Monitor, Self-Manage And Reduce Symptoms of Diabetes       Current Barriers:  Chronic Disease Management support and education needs related to Diabetes  Planned Interventions: Reviewed provider's plan for diabetes management.  Reviewed medications. Spouse reports patient is taking oral medications and administering insulin as prescribed. Discussed importance of consistent blood glucose readings. Discuss options for continuous glucose monitoring device. Currently prefers to use a standard glucometer. Advise to monitor consistently and maintain a log. Provided information regarding hypoglycemia and hyperglycemia along with appropriate interventions. Denies hypoglycemic episodes. Reports episodes of  hyperglycemia with readings between 300 and 400. Thorough discussion regarding s/sx of hyperglycemic crisis and need for immediate medical attention.  Discussed nutritional intake and importance of complying with a diabetic diet. Advised to increase consumption of fruits, vegetables, and lean proteins. Advised to monitor intake of carbohydrates and avoid foods and beverages with added sugar when possible. Discussed options for additional diabetic/nutritional education. Agreeable to considering referrals if readings do not improve. Discussed importance of completing recommended DM preventive care. Reports completing daily foot care as advised. Routine foot care and exams currently being performed by Podiatry/Dr. Milinda Pointer. Reports last eye exam was approximately 5 years ago. Advised to contact Optometry as soon as possible to schedule an eye exam.  Discussed importance of completing ordered labs as prescribed.  Assessed social determinant of health barriers.   Lab Results  Component Value Date   HGBA1C 8.7 (H) 07/08/2022    Symptom Management: Attend all scheduled provider appointments Monitor blood glucose and maintain a log Check feet daily for cuts, sores or redness Wash and dry feet carefully every day Wear comfortable, cotton socks Wear comfortable, well-fitting shoes Participate in prescribed exercise program Read food labels for fat, fiber, carbohydrates and portion size Seek medical attention for significantly elevated blood sugars Schedule an Eye Exam Call provider office for new concerns or questions   Follow Up Plan:  Will follow up within the next month          Goal: CCM (Hypertension) Expected Outcome:  Monitor, Self-Manage And Reduce Symptoms of Hypertension       Current Barriers:  Chronic Disease Management support and education needs related to Hypertension  Planned Interventions: Reviewed current treatment plan related to Hypertension, self-management, and adherence  to plan as established by provider.  Reviewed medications and indications for  use. Spouse reports patient is taking medications as prescribed. Agreed to update team with concerns r/t medication management or prescription cost. Provided information regarding established blood pressure parameters along with indications for notifying a provider. Spouse reports BP is being monitored routinely. Advised to monitor BP at least a few times a week and maintain a BP log. Reviewed symptoms. Denies chest pain or palpitations. Denies headaches, dizziness, or visual changes.  Discussed compliance with recommended cardiac prudent diet. Encouraged to read nutrition labels, continue monitoring sodium intake, and avoid highly processed foods when possible.  Reviewed risks and complications of elevated BP. Reviewed s/sx of heart attack, stroke and worsening symptoms that require immediate medical attention.    BP Readings from Last 3 Encounters:  09/05/22 (!) 140/86  07/10/22 (!) 164/90  03/09/22 (!) 138/91    Symptom Management: Take all medications as prescribed Attend all scheduled provider appointments Call pharmacy for medication refills 3-7 days in advance of running out of medications Check blood pressure  Keep a blood pressure log Call doctor for signs and symptoms of high blood pressure Continue engaging in exercises and mild activity as tolerated Read nutrition labels. Eat whole grains, fruits and vegetables, lean meats and healthy fats Limit salt intake  Call provider office for new concerns or questions     Follow Up Plan:   Will follow up within the next month.             PLAN: A member of the care management team will follow up within the next month    [place] Manager/Chronic Care Management [phone]

## 2022-09-13 ENCOUNTER — Other Ambulatory Visit: Payer: Self-pay | Admitting: Physician Assistant

## 2022-09-13 ENCOUNTER — Ambulatory Visit
Admission: RE | Admit: 2022-09-13 | Discharge: 2022-09-13 | Disposition: A | Discharge status: Home / Self Care | Payer: Medicare Other | Source: Ambulatory Visit | Attending: Physician Assistant | Admitting: Physician Assistant

## 2022-09-13 DIAGNOSIS — N433 Hydrocele, unspecified: Secondary | ICD-10-CM

## 2022-09-13 DIAGNOSIS — N5089 Other specified disorders of the male genital organs: Secondary | ICD-10-CM

## 2022-09-17 ENCOUNTER — Ambulatory Visit: Payer: Medicare Other | Admitting: Podiatry

## 2022-09-17 ENCOUNTER — Encounter: Payer: Self-pay | Admitting: Podiatry

## 2022-09-17 VITALS — BP 165/92 | HR 91

## 2022-09-17 DIAGNOSIS — E1142 Type 2 diabetes mellitus with diabetic polyneuropathy: Secondary | ICD-10-CM

## 2022-09-17 LAB — HM DIABETES FOOT EXAM
HM Diabetic Foot Exam: ABNORMAL
HM Diabetic Foot Exam: NORMAL

## 2022-09-17 MED ORDER — GABAPENTIN 300 MG PO CAPS: 300.0000 mg | ORAL_CAPSULE | Freq: Every day | ORAL | 3 refills | Status: DC

## 2022-09-17 NOTE — Progress Notes (Signed)
Subjective:  Patient ID: Corey Petersen, male    DOB: 1961/05/23,  MRN: [id number] HPI Chief Complaint  Patient presents with   Diabetes    Diabetic Exam - last A1c was 8.7, feet swell a lot which makes painful for walking, neuropathy concerns, not on any neuropathy meds   New Patient (Initial Visit)    62 y.o. male presents with the above complaint.   ROS: Denies fever chills nausea vomiting muscle aches pains calf pain back pain chest pain shortness of breath.  Past Medical History:  Diagnosis Date   Diabetes mellitus without complication ([place])    Hypertension    Past Surgical History:  Procedure Laterality Date   COLONOSCOPY N/A 03/09/2022   Procedure: COLONOSCOPY;  Surgeon: Lucilla Lame, MD;  Location: [facility];  Service: Endoscopy;  Laterality: N/A;   ESOPHAGOGASTRODUODENOSCOPY N/A 03/09/2022   Procedure: ESOPHAGOGASTRODUODENOSCOPY (EGD);  Surgeon: Lucilla Lame, MD;  Location: [facility] ENDOSCOPY;  Service: Endoscopy;  Laterality: N/A;   HERNIA REPAIR      Current Outpatient Medications:    gabapentin (NEURONTIN) 300 MG capsule, Take 1 capsule (300 mg total) by mouth at bedtime., Disp: 90 capsule, Rfl: 3   amLODipine (NORVASC) 10 MG tablet, Take 1 tablet by mouth daily., Disp: , Rfl:    aspirin EC 81 MG tablet, Take 1 tablet (81 mg total) by mouth daily. Swallow whole., Disp: 30 tablet, Rfl: 12   atorvastatin (LIPITOR) 80 MG tablet, TAKE ONE-HALF TABLET BY MOUTH AT BEDTIME FOR CHOLESTEROL, Disp: , Rfl:    calcium carbonate (TUMS - DOSED IN MG ELEMENTAL CALCIUM) 500 MG chewable tablet, Chew 1 tablet (200 mg of elemental calcium total) by mouth 3 (three) times daily with meals., Disp: 90 tablet, Rfl: 1   empagliflozin (JARDIANCE) 25 MG TABS tablet, Take 1 tablet (25 mg total) by mouth daily., Disp: 90 tablet, Rfl: 1   glipiZIDE (GLUCOTROL) 10 MG tablet, Take 1 tablet (10 mg total) by mouth 2 (two) times daily before a meal., Disp: 180 tablet, Rfl: 1   insulin glargine-yfgn  (SEMGLEE) 100 UNIT/ML injection, 70 Units at bedtime., Disp: , Rfl:    losartan (COZAAR) 100 MG tablet, Take 1 tablet (100 mg total) by mouth daily., Disp: 90 tablet, Rfl: 1   metFORMIN (GLUCOPHAGE-XR) 500 MG 24 hr tablet, TAKE TWO TABLETS BY MOUTH TWO TIMES A DAY FOR DIABETES NOTE DOSE INCREASE, Disp: 180 tablet, Rfl: 1   pantoprazole (PROTONIX) 20 MG tablet, Take 1 tablet (20 mg total) by mouth 2 (two) times daily., Disp: 60 tablet, Rfl: 0  Allergies  Allergen Reactions   Ace Inhibitors Cough   Review of Systems Objective:   Vitals:   09/17/22 0941  BP: (!) 165/92  Pulse: 91    General: Well developed, nourished, in no acute distress, alert and oriented x3   Dermatological: Skin is warm, dry and supple bilateral. Nails x 10 are well maintained; remaining integument appears unremarkable at this time. There are no open sores, no preulcerative lesions, no rash or signs of infection present.  Vascular: Dorsalis Pedis artery and Posterior Tibial artery pedal pulses are 2/4 bilateral with immedate capillary fill time. Pedal hair growth present. No varicosities and no lower extremity edema present bilateral.   Neruologic: Grossly intact via light touch bilateral. Vibratory intact via tuning fork bilateral. Protective threshold with Semmes Wienstein monofilament intact to all pedal sites bilateral. Patellar and Achilles deep tendon reflexes 2+ bilateral. No Babinski or clonus noted bilateral.   Musculoskeletal: No gross boney pedal deformities  bilateral. No pain, crepitus, or limitation noted with foot and ankle range of motion bilateral. Muscular strength 5/5 in all groups tested bilateral.  Mild hammertoe deformities bilateral.  Gait: Unassisted, Nonantalgic.    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  Plan: Discussed etiology pathology conservative surgical therapies.  At this point I feel the best  thing to do is start him on gabapentin 300 mg initially at nighttime and I will follow-up with him in 1 month to discuss increasing this if necessary.  I do think that he may need to see nephrology for the chronic edema in his feet.  He is taking amlodipine but has been on this for a period of time I do not think this is entirely problematic however I think getting him looked at would probably be in his best interest.  If nephrology is not concerned then we will consider vascular evaluation for venous insufficiency.     Joseh Sjogren T. Carlos, [place]

## 2022-09-19 ENCOUNTER — Other Ambulatory Visit: Payer: No Typology Code available for payment source | Admitting: Pharmacist

## 2022-09-19 ENCOUNTER — Telehealth: Payer: Self-pay | Admitting: Pharmacist

## 2022-09-19 ENCOUNTER — Telehealth: Payer: Medicare Other

## 2022-09-19 MED ORDER — LANTUS SOLOSTAR 100 UNIT/ML ~~LOC~~ SOPN: 70.0000 [IU] | PEN_INJECTOR | Freq: Every day | SUBCUTANEOUS | 99 refills | Status: DC

## 2022-09-19 MED ORDER — INSULIN PEN NEEDLE 29G X 12.7MM MISC: 1.0000 | Freq: Every day | 3 refills | Status: AC

## 2022-09-19 NOTE — Patient Instructions (Signed)
Goals Addressed             This Visit's Progress    Pharmacy Goals       Our goal A1c is less than 7%. This corresponds with fasting sugars less than 130 and 2 hour after meal sugars less than 180. Please keep a log of your results when checking your blood sugar   Our goal bad cholesterol, or LDL, is less than 70 . This is why it is important to continue taking your atorvastatin.  Check your blood pressure once-twice weekly, and any time you have concerning symptoms like headache, chest pain, dizziness, shortness of breath, or vision changes.   Our goal is less than 130/80.  To appropriately check your blood pressure, make sure you do the following:  1) Avoid caffeine, exercise, or tobacco products for 30 minutes before checking. Empty your bladder. 2) Sit with your back supported in a flat-backed chair. Rest your arm on something flat (arm of the chair, table, etc). 3) Sit still with your feet flat on the floor, resting, for at least 5 minutes.  4) Check your blood pressure. Take 1-2 readings.  5) Write down these readings and bring with you to any provider appointments.  Bring your home blood pressure machine with you to a provider's office for accuracy comparison at least once a year.   Make sure you take your blood pressure medications before you come to any office visit, even if you were asked to fast for labs.  Wallace Cullens, PharmD, [facility] [phone]

## 2022-09-19 NOTE — Progress Notes (Signed)
09/19/2022 Name: Corey Petersen MRN: [id number] DOB: January 16, 1961  Chief Complaint  Patient presents with   Medication Management    Corey Petersen is a 62 y.o. year old male who presented for a telephone visit.   They were referred to the pharmacist by their PCP for assistance in managing diabetes, hypertension, and hyperlipidemia.    Subjective:  Care Team: Primary Care Provider: Eulis Foster, MD ; Next Scheduled Visit: 10/17/2022 Cardiologist: Kate Sable, MD; Next Scheduled Visit: 10/23/2022 Urologist: Hollice Espy, MD; Next Scheduled Visit: 10/10/2022 Podiatry: Garrel Ridgel, DPM; Next Scheduled Visit: 10/17/2022 Endocrinologist: referred to Dr Ronnald Collum (placed on 1/31) Gastroenterology: referred to [place] (placed on 1/31)   Medication Access/Adherence  [id number] [place] [id number]: [phone]: [phone]  Patient reports affordability concerns with their medications: No  Patient reports access/transportation concerns to their pharmacy: No  Patient reports adherence concerns with their medications:  No    Reports uses weekly pillbox; reports misses a doses <once/month  Reports recently had an episode ~1 week ago in the morning where he did not feel well, had some dizziness. Denies checking his blood sugar or blood pressure at the time.    Diabetes:  Note referral placed to Dr. MRonnald Collumby PCP office  Current medications:  - Glipizide 10 mg twice daily (30 minutes before breakfast and supper) - Jardiance 25 mg daily - Insulin glargine - 35 units twice daily - metformin ER 500 mg - 1 tablet twice daily  Identify patient not taking as directed (2 tablets twice daily)  Current glucose readings: morning fasting: 315-405; today: 365   Patient denies hypoglycemic s/sx including dizziness, shakiness, sweating.   Current meal  patterns:  - Breakfast: eggs and sausage, sometimes 2 pieces of toast - Lunch: sometimes skips or cheeseburger or hot dogs - Supper: fried chicken and sometimes rice, string beans, sweet peas - Snacks: pretzels and chips - Drinks: cranberry juice, water, other juice  Current physical activity: limited by back and hip pain; just moving around inside his home  Statin: atorvastatin 80 mg - taking 1/2 tablet (40 mg) daily  Hypertension:  Current medications: amlodipine 10 mg daily; losartan 100 mg daily  Patient has an automated, upper arm home BP cuff Denies monitoring at home recently  Admits to adding salt to his food  Reports previously on CPAP >5 years, but no longer using   Current physical activity: limited by back and hip pain; just moving around inside his home    Objective:  Lab Results  Component Value Date   HGBA1C 8.7 (H) 07/08/2022    Lab Results  Component Value Date   CREATININE 0.98 09/05/2022   BUN 12 09/05/2022   NA 141 09/05/2022   K 4.0 09/05/2022   CL 100 09/05/2022   CO2 24 09/05/2022   BP Readings from Last 3 Encounters:  09/17/22 (!) 165/92  09/05/22 (!) 140/86  07/10/22 (!) 164/90   Pulse Readings from Last 3 Encounters:  09/17/22 91  09/05/22 (!) 104  07/10/22 78     Medications Reviewed Today     Reviewed by DRennis Petty RPH-CPP (Pharmacist) on 09/19/22 at 1242  Med List Status: <None>   Medication Order Taking? Sig Documenting Provider Last Dose Status Informant  amLODipine (NORVASC) 10 MG tablet [id number] Take 1 tablet by mouth daily. [provider] Taking Active   aspirin EC 81 MG tablet  [id number] Yes Take 1 tablet (81 mg total) by mouth daily. Swallow whole. Mikey Kirschner, PA-C Taking Active   atorvastatin (LIPITOR) 80 MG tablet [id number] Yes TAKE ONE-HALF TABLET BY MOUTH AT BEDTIME FOR CHOLESTEROL [provider] Taking Active   calcium carbonate (TUMS - DOSED IN MG ELEMENTAL CALCIUM) 500 MG  chewable tablet [id number] Yes Chew 1 tablet (200 mg of elemental calcium total) by mouth 3 (three) times daily with meals.  Patient taking differently: Chew 200 mg of elemental calcium by mouth 3 (three) times daily with meals. As needed   Lorella Nimrod, MD Taking Active   empagliflozin (JARDIANCE) 25 MG TABS tablet [id number] Yes Take 1 tablet (25 mg total) by mouth daily. Mikey Kirschner, PA-C Taking Active   gabapentin (NEURONTIN) 300 MG capsule [id number] Yes Take 1 capsule (300 mg total) by mouth at bedtime. Hyatt, Max T, DPM Taking Active   glipiZIDE (GLUCOTROL) 10 MG tablet [id number] Yes Take 1 tablet (10 mg total) by mouth 2 (two) times daily before a meal. Mikey Kirschner, PA-C Taking Active   insulin glargine-yfgn (SEMGLEE) 100 UNIT/ML injection [id number] Yes 70 Units at bedtime. [provider] Taking Active            Med Note Minerva Ends, FELECIA N   Wed Sep 12, 2022 12:44 PM) Reports taking 35 units in the morning and 35 at night  losartan (COZAAR) 100 MG tablet [id number] Yes Take 1 tablet (100 mg total) by mouth daily. Mikey Kirschner, PA-C Taking Active   metFORMIN (GLUCOPHAGE-XR) 500 MG 24 hr tablet [id number] Yes TAKE TWO TABLETS BY MOUTH TWO TIMES A DAY FOR DIABETES NOTE DOSE INCREASE Mikey Kirschner, PA-C Taking Active            Med Note Curley Spice, Obelia Bonello A   Wed Sep 19, 2022 12:42 PM) Reports currently taking 1 tablet twice daily  pantoprazole (PROTONIX) 20 MG tablet [id number]  Take 1 tablet (20 mg total) by mouth 2 (two) times daily. Mikey Kirschner, PA-C  Active               Assessment/Plan:   Comprehensive medication review performed; medication list updated in electronic medical record - Reports continues to have (discussed with PCP on 1/31) difficulty eating at times related to burping and sometimes vomiting.  Denies taking pantoprazole  Encourage patient to restart taking pantoprazole as directed by provider on 1/31 Note patient referred to [place] by PCP office on 1/31 Encourage patient to contact [place] today regarding scheduling from referral by PCP office  Encourage patient to contact PCP office regarding any new or worsening medical concerns  Diabetes: - Currently uncontrolled - Reviewed long term cardiovascular and renal outcomes of uncontrolled blood sugar - Reviewed dietary modifications including:  Having regular well-balanced meals and snacks, while controlling carbohydrate portion sizes  Decreasing intake of sugary beverages, drinking water instead  Review nutrition labels for carbohydrate content of foods  Discuss lower-carbohydrate snack options - Reviewed lifestyle modifications including: following up with PCP regarding back and hip pain that patient reports currently limits his movement/exercise - Identify patient not currently taking his metformin as directed Counsel patient on rational for taking metformin as directed (2 tablets twice daily) to aid with blood sugar control Patient verbalizes understanding and states will start taking as directed.  - Recommend to check glucose, keep log of results and have this record to review at upcoming medical appointments - Will collaborate with PCP regarding patient's request to have insulin glargine [facility]  [facility] through his [place]. Note patient has been using insulin vial + syringe, but now requesting to use insulin pen + pen needles instead   Hypertension: - Currently uncontrolled - Reviewed long term cardiovascular and renal outcomes of uncontrolled blood pressure - Reviewed appropriate blood pressure monitoring technique and reviewed goal blood pressure. Recommended to check home blood pressure and heart rate, keep log of results and have this record to review at upcoming medical appointments - Encourage patient to work on decreasing salt/sodium intake, while increasing intake of fresh fruits and  vegetables - Recommend to follow up with PCP regarding history of sleep apnea at upcoming visit as reports not on CPAP in >5 years/machine old     Follow Up Plan: Clinical Pharmacist will follow up with patient by telephone on 10/03/2022 at 10 am  Wallace Cullens, PharmD, [place] [phone]

## 2022-09-19 NOTE — Progress Notes (Signed)
Patient requesting to receive his insulin glargine through local pharmacy instead of [facility], would you consider sending Rxs for Lantus Solostar + pen needles [facility] (as also requesting to switch from using vials to insulin pen)?  Thank you!  Wallace Cullens, PharmD, [facility] [phone]

## 2022-09-19 NOTE — Telephone Encounter (Signed)
Semglee switched to lantus solostar pen with needles   [facility]   Eulis Foster, MD  [facility]

## 2022-09-20 ENCOUNTER — Ambulatory Visit: Payer: Medicare Other

## 2022-09-20 DIAGNOSIS — E1159 Type 2 diabetes mellitus with other circulatory complications: Secondary | ICD-10-CM

## 2022-09-20 NOTE — Chronic Care Management (AMB) (Signed)
  Chronic Care Management   CCM RN Visit Note  09/20/2022 Name: Abayomi Abler MRN: [id number] DOB: February 25, 1961  Subjective: Corey Petersen is a 62 y.o. year old male who is a primary care patient of Simmons-Robinson, [place], MD. The patient was referred to the Chronic Care Management team for assistance with care management needs subsequent to provider initiation of CCM services and plan of care.    Today's Visit:  Engaged with patient by telephone for follow up visit.    Goals Addressed                        Goal: CCM (Hypertension) Expected Outcome:  Monitor, Self-Manage And Reduce Symptoms of Hypertension       Current Barriers:  Chronic Disease Management support and education needs related to Hypertension  Planned Interventions: Provided information regarding established blood pressure parameters along with indications for notifying a provider. Spouse reports BP is being monitored routinely. Advised to monitor BP at least a few times a week and maintain a BP log. Reviewed symptoms. Denies chest pain or palpitations. Denies headaches, dizziness, or visual changes.  Discussed compliance with recommended cardiac prudent diet. Encouraged to read nutrition labels, continue monitoring sodium intake, and avoid highly processed foods when possible.  Reviewed risks and complications of elevated BP. Reviewed s/sx of heart attack, stroke and worsening symptoms that require immediate medical attention. Update 09/20/22:  Update regarding need for evaluation by Gastroenterology. Per hospital discharge instructions on 07/10/22, follow up evaluation with Gastroenterology was recommended.  Mr. Regen continues to experience symptoms of burping, nausea, and vomiting. Reports previously being treated for H Pylori however symptoms have not resolved. Contacted [facility]. Pending call back regarding options for scheduling. Will contact PCP if a new referral is required.   BP Readings from Last 3  Encounters:  09/05/22 (!) 140/86  07/10/22 (!) 164/90  03/09/22 (!) 138/91    Symptom Management: Take all medications as prescribed Attend all scheduled provider appointments Call pharmacy for medication refills 3-7 days in advance of running out of medications Check blood pressure  Keep a blood pressure log Call doctor for signs and symptoms of high blood pressure Continue engaging in exercises and mild activity as tolerated Read nutrition labels. Eat whole grains, fruits and vegetables, lean meats and healthy fats Limit salt intake  Call provider office for new concerns or questions     Follow Up Plan:   Will follow up within the next month.             PLAN: A member of the care management team will follow up within the next month   [place] Manager/Chronic Care Management [phone]

## 2022-09-26 ENCOUNTER — Other Ambulatory Visit: Payer: Medicare Other | Admitting: Pharmacist

## 2022-09-26 ENCOUNTER — Ambulatory Visit: Payer: Self-pay

## 2022-09-26 DIAGNOSIS — K3184 Gastroparesis: Secondary | ICD-10-CM

## 2022-09-26 NOTE — Telephone Encounter (Signed)
  Chief Complaint: vomiting  Symptoms: vomiting everyday and burping, abdominal pain comes and goes  Frequency: 1 year  Pertinent Negatives: Patient denies diarrhea or abdominal pain present today Disposition: []$ ED /[]$ Urgent Care (no appt availability in office) / []$ Appointment(In office/virtual)/ []$  [facility]/ []$ Home Care/ []$ Refused Recommended Disposition /[]$  Mobile Bus/ [x]$  Follow-up with PCP Additional Notes: pt states that he spoke with GI today and they are needing referral changed to Urgent or stat so they can go ahead and schedule him for sooner appt. Pt didn't want to schedule OV with PCP d/t needing to go to GI. Advised him I would send message back for provider and care advice given as well. Pt verbalized understanding.   Summary: VOMITING   Caller states patient is vomiting and burping uncontrollable. Patient was seen in the ED and was advised patient can control the burping. Also, caller stated they received a call from [facility] and the next available is not until 12/2022 and to call PCP and request a STAT referral.     Reason for Disposition  Vomiting is a chronic symptom (recurrent or ongoing AND present > 4 weeks)  Answer Assessment - Initial Assessment Questions 1. VOMITING SEVERITY: "How many times have you vomited in the past 24 hours?"     - MILD:  1 - 2 times/day    - MODERATE: 3 - 5 times/day, decreased oral intake without significant weight loss or symptoms of dehydration    - SEVERE: 6 or more times/day, vomits everything or nearly everything, with significant weight loss, symptoms of dehydration      3-4 times 2. ONSET: "When did the vomiting begin?"      1 year, everyday  3. FLUIDS: "What fluids or food have you vomited up today?" "Have you been able to keep any fluids down?"     Yes liquids  4. ABDOMEN PAIN: "Are your having any abdomen pain?" If Yes : "How bad is it and what does it feel like?" (e.g., crampy, dull, intermittent, constant)       Last night, not liquids  5. DIARRHEA: "Is there any diarrhea?" If Yes, ask: "How many times today?"      No  9. OTHER SYMPTOMS: "Do you have any other symptoms?" (e.g., fever, headache, vertigo, vomiting blood or coffee grounds, recent head injury)     Burping  Protocols used: Vomiting-A-AH

## 2022-09-26 NOTE — Telephone Encounter (Signed)
Referral submitted with urgent status   Eulis Foster, MD  [facility]

## 2022-09-26 NOTE — Addendum Note (Signed)
Addended by: Adolph Pollack on: 09/26/2022 03:00 PM   Modules accepted: Orders

## 2022-09-26 NOTE — Telephone Encounter (Signed)
Patient advised.

## 2022-09-27 ENCOUNTER — Telehealth: Payer: Self-pay | Admitting: Gastroenterology

## 2022-09-27 NOTE — Telephone Encounter (Signed)
Patient calling to schedule an urgent appointment, where can I schedule this patient?

## 2022-09-27 NOTE — Telephone Encounter (Signed)
I spoke to wife and have the pt scheduled for 11/13/22 at 1pm... I also sent gastroparesis diet info via Mychart for the pt to try in the meantime

## 2022-10-02 ENCOUNTER — Other Ambulatory Visit: Payer: Self-pay | Admitting: Physician Assistant

## 2022-10-02 DIAGNOSIS — B9681 Helicobacter pylori [H. pylori] as the cause of diseases classified elsewhere: Secondary | ICD-10-CM

## 2022-10-03 ENCOUNTER — Telehealth: Payer: Self-pay | Admitting: Pharmacist

## 2022-10-03 ENCOUNTER — Other Ambulatory Visit: Payer: Medicare Other | Admitting: Pharmacist

## 2022-10-03 NOTE — Progress Notes (Signed)
   Outreach Note  10/03/2022 Name: Corey Petersen MRN: [id number] DOB: [birth year]  Referred by: Eulis Foster, MD Reason for referral : No chief complaint on file.  Was unable to reach patient via telephone today and have left HIPAA compliant message with patient's wife asking patient to return my call.   Place coordination of care call to Dr. De Hollingshead office today to check status of Endocrinology referral from 1/31 (refaxed on 2/8). Receive response that patient has not yet been scheduled, but will ask referral coordinator to review this referral and contact patient.    Follow Up Plan:   Note patient has next appointment scheduled with PCP on 10/17/2022 Will attempt to reach patient by telephone again within the next 30 days  Wallace Cullens, PharmD, [facility] [phone]

## 2022-10-04 DIAGNOSIS — I152 Hypertension secondary to endocrine disorders: Secondary | ICD-10-CM

## 2022-10-04 DIAGNOSIS — E1159 Type 2 diabetes mellitus with other circulatory complications: Secondary | ICD-10-CM

## 2022-10-04 DIAGNOSIS — E119 Type 2 diabetes mellitus without complications: Secondary | ICD-10-CM

## 2022-10-04 DIAGNOSIS — Z794 Long term (current) use of insulin: Secondary | ICD-10-CM

## 2022-10-10 ENCOUNTER — Ambulatory Visit (INDEPENDENT_AMBULATORY_CARE_PROVIDER_SITE_OTHER): Payer: Medicare Other | Admitting: Urology

## 2022-10-10 ENCOUNTER — Encounter: Payer: Self-pay | Admitting: Urology

## 2022-10-10 VITALS — BP 157/92 | HR 91 | Ht 66.0 in | Wt 220.0 lb

## 2022-10-10 DIAGNOSIS — N529 Male erectile dysfunction, unspecified: Secondary | ICD-10-CM

## 2022-10-10 DIAGNOSIS — N509 Disorder of male genital organs, unspecified: Secondary | ICD-10-CM | POA: Diagnosis not present

## 2022-10-10 DIAGNOSIS — N5089 Other specified disorders of the male genital organs: Secondary | ICD-10-CM | POA: Diagnosis not present

## 2022-10-10 MED ORDER — TADALAFIL 20 MG PO TABS: 20.0000 mg | ORAL_TABLET | Freq: Every day | ORAL | 0 refills | Status: DC | PRN

## 2022-10-10 NOTE — Progress Notes (Signed)
I, Jeanmarie Hubert Maxie,acting as a scribe for Hollice Espy, MD.,have documented all relevant documentation on the behalf of Hollice Espy, MD,as directed by  Hollice Espy, MD while in the presence of Hollice Espy, MD.   10/10/22 12:00 PM   Corey Petersen 06/26/1961 [id number]  Referring provider: Mikey Kirschner, PA-C [address] [place],  [place] [address]  Chief Complaint  Patient presents with   Establish Care   Hydrocele    HPI: 62 year-old male who is referred for a further evaluation of hydrocele. He originally presented to his primary care Mikey Kirschner complaining of scrotal mass which a scrotum ultrasound was ordered.   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  He initially noticed a "little ball, a little knot" in his scrotum a year ago, with no significant changes since its discovery. He denies any history of scrotal trauma, infection, or vasectomy. Denies any issues with urination.   He also reports a year-long history of erectile dysfunction, with a gradual worsening of his condition. He has tried a white pill for erectile dysfunction, presumed to be a generic form of Viagra, which initially was effective but has since lost its efficacy. He has a history of gastroparesis.  PMH: Past Medical History:  Diagnosis Date   Diabetes mellitus without complication ([place])    Hypertension     Surgical History: Past Surgical History:  Procedure Laterality Date   COLONOSCOPY N/A 03/09/2022   Procedure: COLONOSCOPY;  Surgeon: Lucilla Lame, MD;  Location: [facility] ENDOSCOPY;  Service: Endoscopy;  Laterality: N/A;   ESOPHAGOGASTRODUODENOSCOPY N/A 03/09/2022   Procedure: ESOPHAGOGASTRODUODENOSCOPY (EGD);  Surgeon: Lucilla Lame, MD;  Location: [facility] ENDOSCOPY;  Service: Endoscopy;  Laterality: N/A;   HERNIA REPAIR      Home  Medications:  Allergies as of 10/10/2022       Reactions   Ace Inhibitors Cough        Medication List        Accurate as of October 10, 2022 12:00 PM. If you have any questions, ask your nurse or doctor.          amLODipine 10 MG tablet Commonly known as: NORVASC Take 1 tablet by mouth daily.   aspirin EC 81 MG tablet Take 1 tablet (81 mg total) by mouth daily. Swallow whole.   atorvastatin 80 MG tablet Commonly known as: LIPITOR TAKE ONE-HALF TABLET BY MOUTH AT BEDTIME FOR CHOLESTEROL   calcium carbonate 500 MG chewable tablet Commonly known as: TUMS - dosed in mg elemental calcium Chew 1 tablet (200 mg of elemental calcium total) by mouth 3 (three) times daily with meals. What changed: additional instructions   empagliflozin 25 MG Tabs tablet Commonly known as: JARDIANCE Take 1 tablet (25 mg total) by mouth daily.   gabapentin 300 MG capsule Commonly known as: NEURONTIN Take 1 capsule (300 mg total) by mouth at bedtime.   glipiZIDE 10 MG tablet Commonly known as: GLUCOTROL Take 1 tablet (10 mg total) by mouth 2 (two) times daily before a meal.   Insulin Pen Needle 29G X 12.7MM Misc 1 Needle by Does not apply route at bedtime.   Lantus SoloStar 100 UNIT/ML Solostar Pen Generic drug: insulin glargine Inject 70 Units into the skin at bedtime.   losartan 100 MG tablet Commonly known as: COZAAR Take 1 tablet (100 mg total) by mouth daily.   metFORMIN 500 MG 24  hr tablet Commonly known as: GLUCOPHAGE-XR TAKE TWO TABLETS BY MOUTH TWO TIMES A DAY FOR DIABETES NOTE DOSE INCREASE   pantoprazole 20 MG tablet Commonly known as: PROTONIX TAKE 1 TABLET BY MOUTH TWICE A DAY   tadalafil 20 MG tablet Commonly known as: CIALIS Take 1 tablet (20 mg total) by mouth daily as needed for erectile dysfunction. Started by: Hollice Espy, MD        Allergies:  Allergies  Allergen Reactions   Ace Inhibitors Cough    Family History: Family History  Problem  Relation Age of Onset   Hypertension Mother     Social History:  reports that he has never smoked. He has never used smokeless tobacco. He reports that he does not drink alcohol and does not use drugs.   Physical Exam: BP (!) 157/92   Pulse 91   Ht '5\' 6"'$  (1.676 m)   Wt 220 lb (99.8 kg)   BMI 35.51 kg/m   Constitutional:  Alert and oriented, No acute distress. HEENT: [place] AT, moist mucus membranes.  Trachea midline, no masses. GU:  A palpable firm, 29m nodular area superior to the right testicle, non-tender Neurologic: Grossly intact, no focal deficits, moving all 4 extremities. Psychiatric: Normal mood and affect.  Pertinent Imaging: EXAM: SCROTAL ULTRASOUND   DOPPLER ULTRASOUND OF THE TESTICLES   TECHNIQUE: Complete ultrasound examination of the testicles, epididymis, and other scrotal structures was performed. Color and spectral Doppler ultrasound were also utilized to evaluate blood flow to the testicles.   COMPARISON:  CT abdomen pelvis dated 07/08/2022.   FINDINGS: Right testicle   Measurements: 3.1 x 1.7 x 2.4 cm. Multiple cystic areas in the right testicle do not demonstrate associated mass effect or internal vascularity, consistent with tubular ectasia of the rete testis. The exterior contour of the right testicle appears unchanged and this is unlikely to explain the patient's palpable area. Microlithiasis is noted.   Left testicle   Measurements: 3.5 x 2.2 x 2.3 cm. No mass visualized. Microlithiasis is noted.   Right epididymis:  Normal in size and appearance.   Left epididymis:  Normal in size and appearance.   Hydrocele: A moderate to large left hydrocele is noted. No hydrocele on the right.   Varicocele:  None visualized.   Pulsed Doppler interrogation of both testes demonstrates normal low resistance arterial and venous waveforms bilaterally.   IMPRESSION: 1. Tubular ectasia of the rete testis on the right. The exterior contour of the right  testicle appears unchanged and this is unlikely to explain the patient's palpable area. 2. Moderate to large left hydrocele. 3. Bilateral testicular microlithiasis. Current literature suggests that testicular microlithiasis is not a significant independent risk factor for development of testicular carcinoma, and that follow up imaging is not warranted in the absence of other risk factors. Monthly testicular self-examination and annual physical exams are considered appropriate surveillance. If patient has other risk factors for testicular carcinoma, then referral to Urology should be considered. (Reference: DeCastro, et al.: A 5-Year Follow up Study of Asymptomatic Men with Testicular Microlithiasis. J Urol 2008; [id number])     Electronically Signed   By: TZerita BoersM.D.   On: 09/13/2022 12:23  Personally reviewed and agree with radiologic interpretation.  Assessment & Plan:    Right extratesticular mass - Most certainly benign. No further evaluation or treatment is required.  - Encouraged to continue monthly testicular self-exams.   Erectile dysfunction - We discussed the pathophysiology of erectile dysfunction today along with possible contributing  factors. Discussed possible treatment options including PDE 5 inhibitors, vacuum erectile device, intracavernosal injection, MUSE, and placement of the inflatable or malleable penile prosthesis for refractory cases. - Prescribed Tadalafil 20 mg. Advised to take the medication at least two hours before sexual intercourse, preferably on an empty stomach to account for gastroparesis. - Also encouraged to manage his gastroparesis and other comorbidities, including diabetes and heart disease, as they may be contributing to his erectile dysfunction.  3.  Testicular microlithiasis, incidental - Encouraged monthly self exams. Not deemed a pre-malignant condition.  Return if symptoms worsen or fail to improve.  I have reviewed the  above documentation for accuracy and completeness, and I agree with the above.   Hollice Espy, MD   [facility] [address], [place], [place] [address] [phone]

## 2022-10-16 NOTE — Progress Notes (Signed)
Established patient visit   Patient: Corey Petersen   DOB: 1961-07-03   62 y.o. Male  MRN: [id number] Visit Date: 10/17/2022  Today's healthcare provider: Eulis Foster, MD   Chief Complaint  Patient presents with   Medical Management of Chronic Issues   Subjective    Patient presents today for follow up and to establish with new PCP.  He reports his GI issues are causing him pain, nausea/emesis and excessive burping. He is not scheduled to see GI until April. He is very concerned about this.  He also mentions "kidney" pain bilaterally, denies any urinary concerns.   He also reports continued BLE edema.   He also reports his left hand/arm stay numb, radiates up into his arm; denies any tingling/pain or recent injuries.      Reports weight loss of 100lbs over the last year, he reports weighing 300lbs last year but he reports that someone added weight loss drugs to his food and caused him to lose a significant amount of weight   Diabetes Mellitus Type II, Follow-up  Lab Results  Component Value Date   HGBA1C 4.8 10/17/2022   HGBA1C 8.7 (H) 07/08/2022   Wt Readings from Last 3 Encounters:  10/17/22 209 lb (94.8 kg)  10/10/22 220 lb (99.8 kg)  09/05/22 215 lb 11.2 oz (97.8 kg)   Last seen for diabetes 6 weeks ago.  Management since then includes no changes. He reports good compliance with treatment. He is not having side effects.   Symptoms: Yes fatigue No foot ulcerations  Yes appetite changes Yes nausea  No paresthesia of the feet  No polydipsia  No polyuria No visual disturbances   Yes vomiting     Home blood sugar records: fasting range: 300-400    Episodes of hypoglycemia? No    Current insulin regiment: 70 units at bedtime  Most Recent Eye Exam: does not get regular eye exams Current exercise: none Current diet habits: in general, a "healthy" diet    Pertinent Labs: No results found for: "CHOL", "HDL", "LDLCALC", "LDLDIRECT", "TRIG",  "CHOLHDL" Lab Results  Component Value Date   NA 137 10/17/2022   K 3.8 10/17/2022   CREATININE 0.93 10/17/2022   EGFR 93 10/17/2022   LABMICR CANCELED 09/05/2022     --------------------------------------------------------------------------------------------------- Hypertension, follow-up  BP Readings from Last 3 Encounters:  10/17/22 (!) 147/92  10/10/22 (!) 157/92  09/17/22 (!) 165/92   Wt Readings from Last 3 Encounters:  10/17/22 209 lb (94.8 kg)  10/10/22 220 lb (99.8 kg)  09/05/22 215 lb 11.2 oz (97.8 kg)     He was last seen for hypertension 6 weeks ago.  BP at that visit was 165/92. Management since that visit includes continue losartan 100 and amlodipine 10mg .  He has not taken BP meds this AM  He reports good compliance with treatment. He is not having side effects. He denies having headaches on a regular basis.  He is following a Regular diet. He is not exercising. He does not smoke.  Use of agents associated with hypertension: none.   Outside blood pressures are  Symptoms: No chest pain No chest pressure  No palpitations No syncope  No dyspnea No orthopnea  No paroxysmal nocturnal dyspnea Yes lower extremity edema   Pertinent labs No results found for: "CHOL", "HDL", "LDLCALC", "LDLDIRECT", "TRIG", "CHOLHDL" Lab Results  Component Value Date   NA 137 10/17/2022   K 3.8 10/17/2022   CREATININE 0.93 10/17/2022   EGFR 93 10/17/2022  GLUCOSE 152 (H) 10/17/2022   TSH 1.610 10/17/2022     The ASCVD Risk score (Arnett DK, et al., 2019) failed to calculate for the following reasons:   Cannot find a previous HDL lab   Cannot find a previous total cholesterol lab  --------------------------------------------------------------------------------------------------- Concern for Gastroparesis: scheduled with GI 11/13/22, has been on pantoprazole for GERD symptoms, he reports having a tightness and heaviness in his chest 3-5 times per week with the burping  episodes. He also reports having abdominal pain across his lower abdomen. He states that he has a hard time getting up from supine position due to the pain. He has also had frequent episodes of hiccups that can last for hours. This has been going on for two years. He sometimes has nausea. He reports that he has small volume stools frequently. This is improved when he drinks cranberry juice.   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, so he has not been using the Cpap in 5 years or more, he reports that it did not seem to help his sleep, he reports that he previously used this nightly 7 nights per week    Kidney function: patient reports that he is concerned about the health of his kidneys, he reports having pain in his back at the level of the kidneys, he states that he was unable to hold his urine prior to this appointment and therefore is unable to provide urine sample for urine albumin assessment   Social hx: patient's mother is 63 years old and requires increased attention from caregivers (the patient and his wife) so patient's wife is home helping with her today, patient states that his wife normally helps him with medical appointments  Medications: Outpatient Medications Prior to Visit  Medication Sig   amLODipine (NORVASC) 10 MG tablet Take 1 tablet by mouth daily.   aspirin EC 81 MG tablet Take 1 tablet (81 mg total) by mouth daily. Swallow whole.   atorvastatin (LIPITOR) 80 MG tablet TAKE ONE-HALF TABLET BY MOUTH AT BEDTIME FOR CHOLESTEROL   calcium carbonate (TUMS - DOSED IN MG ELEMENTAL CALCIUM) 500 MG chewable tablet Chew 1 tablet (200 mg of elemental calcium total) by mouth 3 (three) times daily with meals. (Patient taking differently: Chew 200 mg of elemental calcium by mouth 3 (three) times daily with meals. As needed)   empagliflozin (JARDIANCE) 25 MG TABS tablet Take 1 tablet (25 mg total) by mouth daily.   gabapentin (NEURONTIN) 300 MG capsule Take 1  capsule (300 mg total) by mouth at bedtime.   glipiZIDE (GLUCOTROL) 10 MG tablet Take 1 tablet (10 mg total) by mouth 2 (two) times daily before a meal.   Insulin Pen Needle 29G X 12.7MM MISC 1 Needle by Does not apply route at bedtime.   losartan (COZAAR) 100 MG tablet Take 1 tablet (100 mg total) by mouth daily.   metFORMIN (GLUCOPHAGE-XR) 500 MG 24 hr tablet TAKE TWO TABLETS BY MOUTH TWO TIMES A DAY FOR DIABETES NOTE DOSE INCREASE   pantoprazole (PROTONIX) 20 MG tablet TAKE 1 TABLET BY MOUTH TWICE A DAY   tadalafil (CIALIS) 20 MG tablet Take 1 tablet (20 mg total) by mouth daily as needed for erectile dysfunction.   [DISCONTINUED] insulin glargine (LANTUS SOLOSTAR) 100 UNIT/ML Solostar Pen Inject 70 Units into the skin at bedtime.   No facility-administered medications prior to visit.    Review of Systems  Constitutional:  Positive for appetite change.  Gastrointestinal:  Positive  for abdominal pain, nausea and vomiting.  Genitourinary:  Positive for flank pain.  Neurological:  Positive for numbness.  All other systems reviewed and are negative.      Objective    BP (!) 147/92   Pulse 100   Temp 98.6 F (37 C) (Oral)   Wt 209 lb (94.8 kg)   SpO2 100%   BMI 33.73 kg/m    Physical Exam Vitals reviewed.  Constitutional:      General: He is not in acute distress.    Appearance: Normal appearance. He is not ill-appearing, toxic-appearing or diaphoretic.     Comments: Belches 2-3 times for three episodes during visit  Eyes:     Conjunctiva/sclera: Conjunctivae normal.  Neck:     Thyroid: No thyroid mass, thyromegaly or thyroid tenderness.     Vascular: No carotid bruit.  Cardiovascular:     Rate and Rhythm: Normal rate and regular rhythm.     Pulses: Normal pulses.     Heart sounds: Normal heart sounds. No murmur heard.    No friction rub. No gallop.  Pulmonary:     Effort: Pulmonary effort is normal. No respiratory distress.     Breath sounds: Normal breath sounds. No  stridor. No wheezing, rhonchi or rales.  Abdominal:     General: Bowel sounds are normal. There is no distension.     Palpations: Abdomen is soft.     Tenderness: There is abdominal tenderness in the right lower quadrant, periumbilical area and left lower quadrant.  Musculoskeletal:     Right lower leg: No edema.     Left lower leg: No edema.  Lymphadenopathy:     Cervical: No cervical adenopathy.  Skin:    Findings: No erythema or rash.  Neurological:     Mental Status: He is alert and oriented to person, place, and time.     Results for orders placed or performed in visit on 10/17/22  Ferritin  Result Value Ref Range   Ferritin 321 30 - 400 ng/mL  B12 and Folate Panel  Result Value Ref Range   Vitamin B-12 894 232 - 1,245 pg/mL   Folate 12.7 >3.0 ng/mL  Magnesium  Result Value Ref Range   Magnesium 2.1 1.6 - 2.3 mg/dL  TSH + free T4  Result Value Ref Range   TSH 1.610 0.450 - 4.500 uIU/mL   Free T4 1.68 0.82 - 1.77 ng/dL  Basic metabolic panel  Result Value Ref Range   Glucose 152 (H) 70 - 99 mg/dL   BUN 9 8 - 27 mg/dL   Creatinine, Ser 0.93 0.76 - 1.27 mg/dL   eGFR 93 >59 mL/min/1.73   BUN/Creatinine Ratio 10 10 - 24   Sodium 137 134 - 144 mmol/L   Potassium 3.8 3.5 - 5.2 mmol/L   Chloride 97 96 - 106 mmol/L   CO2 25 20 - 29 mmol/L   Calcium 8.9 8.6 - 10.2 mg/dL  Hemoglobin A1c  Result Value Ref Range   Hgb A1c MFr Bld 4.8 4.8 - 5.6 %   Est. average glucose Bld gHb Est-mCnc 91 mg/dL    Assessment & Plan     Problem List Items Addressed This Visit       Cardiovascular and Mediastinum   Hypertension associated with diabetes ([place]) - Primary    Chronic  Uncontrolled  BP elevated today  Will collect BMP and mag as well as TSH and free T4 today to evaluate for causes of elevated BP  He will  continue current regimen including amlodipine 10mg  and losartan 100mg  daily  Patient reports being in pain due to his abdominal pains, considered this is likely  contributing to elevated BP  Patient's reports of taking in less PO due to abdominal pain increases concern for risk of hypotension with addition of BP agents, will have patient record ambulatory BP and follow up in 2 weeks       Relevant Medications   insulin glargine (LANTUS SOLOSTAR) 100 UNIT/ML Solostar Pen   Other Relevant Orders   Basic Metabolic Panel (BMET)   TSH + free T4     Digestive   Gastroparesis     Endocrine   Insulin dependent type 2 diabetes mellitus (H[facility])    Chronic  Reports elevated blood glucose levels 300-400 fasting levels despite taking 70 units lantus nightly  Recommended increase to 75 units daily  Will repeat A1c today  Follow up in 2 weeks  Unable to provide urine microalbumin sample today  Counseled patient on importance of evaluating this measure to gain better understanding of kidney function, he voiced understanding  Will revisit this at his follow up appt  Renal US ordered  DM foot exam completed, follows with podiatry  Needs urine albumin testing       Relevant Medications   insulin glargine (LANTUS SOLOSTAR) 100 UNIT/ML Solostar Pen   Other Relevant Orders   Hemoglobin A1c   US Renal     Nervous and Auditory   Neuropathy, arm, left    Persistent symptoms  Patient has had negative ECG for acute MI  Symptoms continue  Considered anemia, iron deficiency, electrolyte abnormalities and vitamin deficiency as potential causes  Patient recently started on gabapentin by podiatry  Will check vitamin B12, CBC, ferritin and TSH/free T4 today  Assessed for worsened DM with repeat A1c today        Relevant Orders   Ferritin (Completed)   B12 and Folate Panel (Completed)   Magnesium (Completed)     Other   Urinary hesitancy    BMP ordered        Relevant Orders   US Renal     Return in about 2 weeks (around 10/31/2022) for HTN.        The entirety of the information documented in the History of Present Illness, Review of Systems  and Physical Exam were personally obtained by me. Portions of this information were initially documented by Pricilla Handler. I, Eulis Foster, MD have reviewed the documentation above for thoroughness and accuracy.      Eulis Foster, MD  [facility] [phone] (phone) [phone] (fax)  [place]

## 2022-10-17 ENCOUNTER — Ambulatory Visit (INDEPENDENT_AMBULATORY_CARE_PROVIDER_SITE_OTHER): Payer: Medicare Other | Admitting: Family Medicine

## 2022-10-17 ENCOUNTER — Encounter: Payer: Self-pay | Admitting: Family Medicine

## 2022-10-17 ENCOUNTER — Ambulatory Visit: Payer: Medicare Other | Admitting: Podiatry

## 2022-10-17 VITALS — BP 147/92 | HR 100 | Temp 98.6°F | Wt 209.0 lb

## 2022-10-17 DIAGNOSIS — M130 Polyarthritis, unspecified: Secondary | ICD-10-CM | POA: Insufficient documentation

## 2022-10-17 DIAGNOSIS — M545 Other chronic pain: Secondary | ICD-10-CM | POA: Insufficient documentation

## 2022-10-17 DIAGNOSIS — I152 Hypertension secondary to endocrine disorders: Secondary | ICD-10-CM

## 2022-10-17 DIAGNOSIS — E119 Type 2 diabetes mellitus without complications: Secondary | ICD-10-CM | POA: Diagnosis not present

## 2022-10-17 DIAGNOSIS — G5692 Unspecified mononeuropathy of left upper limb: Secondary | ICD-10-CM | POA: Diagnosis not present

## 2022-10-17 DIAGNOSIS — M5136 Other intervertebral disc degeneration, lumbar region: Secondary | ICD-10-CM | POA: Insufficient documentation

## 2022-10-17 DIAGNOSIS — E1159 Type 2 diabetes mellitus with other circulatory complications: Secondary | ICD-10-CM | POA: Diagnosis not present

## 2022-10-17 DIAGNOSIS — E669 Obesity, unspecified: Secondary | ICD-10-CM | POA: Insufficient documentation

## 2022-10-17 DIAGNOSIS — M179 Osteoarthritis of knee, unspecified: Secondary | ICD-10-CM | POA: Insufficient documentation

## 2022-10-17 DIAGNOSIS — R3911 Hesitancy of micturition: Secondary | ICD-10-CM

## 2022-10-17 DIAGNOSIS — M51369 Other intervertebral disc degeneration, lumbar region without mention of lumbar back pain or lower extremity pain: Secondary | ICD-10-CM | POA: Insufficient documentation

## 2022-10-17 DIAGNOSIS — M47816 Spondylosis without myelopathy or radiculopathy, lumbar region: Secondary | ICD-10-CM | POA: Insufficient documentation

## 2022-10-17 DIAGNOSIS — K3184 Gastroparesis: Secondary | ICD-10-CM

## 2022-10-17 DIAGNOSIS — D480 Neoplasm of uncertain behavior of bone and articular cartilage: Secondary | ICD-10-CM | POA: Insufficient documentation

## 2022-10-17 DIAGNOSIS — Z794 Long term (current) use of insulin: Secondary | ICD-10-CM

## 2022-10-17 DIAGNOSIS — B9681 Helicobacter pylori [H. pylori] as the cause of diseases classified elsewhere: Secondary | ICD-10-CM | POA: Insufficient documentation

## 2022-10-17 MED ORDER — LANTUS SOLOSTAR 100 UNIT/ML ~~LOC~~ SOPN: 75.0000 [IU] | PEN_INJECTOR | Freq: Every day | SUBCUTANEOUS | 99 refills | Status: DC

## 2022-10-17 NOTE — Patient Instructions (Addendum)
We will check labs to investigate causes of the symptoms in your arm. We will follow up with results of labs once they are available.    I have ordered an ultrasound of your kidneys, please be on the lookout for a call to schedule this.   For your diabetes, please continue taking metformin 1000 mg twice daily, glipizide '10mg'$  and jardiance as well as insulin. I would like for you to increase the insulin to 75 units at bed time.  We can discuss starting an additional injection medication for your diabetes but I want to make sure we have a better understanding of your abdominal concerns as this class of medications have side effects that could impact your symptoms.   It will be important to monitor the amount of bread, pasta, rice, potatoes that you eat to help with blood glucose control   Please schedule to see me in 2 weeks for blood pressure follow up.   See the table below to record your blood pressures  DATE TIME BLOOD PRESSURE  BLOOD SUGAR

## 2022-10-18 LAB — MAGNESIUM: Magnesium: 2.1 mg/dL (ref 1.6–2.3)

## 2022-10-18 LAB — HEMOGLOBIN A1C
Est. average glucose Bld gHb Est-mCnc: 91 mg/dL
Hgb A1c MFr Bld: 4.8 % (ref 4.8–5.6)

## 2022-10-18 LAB — BASIC METABOLIC PANEL
BUN/Creatinine Ratio: 10 (ref 10–24)
BUN: 9 mg/dL (ref 8–27)
CO2: 25 mmol/L (ref 20–29)
Calcium: 8.9 mg/dL (ref 8.6–10.2)
Chloride: 97 mmol/L (ref 96–106)
Creatinine, Ser: 0.93 mg/dL (ref 0.76–1.27)
Glucose: 152 mg/dL — ABNORMAL HIGH (ref 70–99)
Potassium: 3.8 mmol/L (ref 3.5–5.2)
Sodium: 137 mmol/L (ref 134–144)
eGFR: 93 mL/min/{1.73_m2} (ref >59)

## 2022-10-18 LAB — TSH+FREE T4
Free T4: 1.68 ng/dL (ref 0.82–1.77)
TSH: 1.61 u[IU]/mL (ref 0.450–4.500)

## 2022-10-18 LAB — FERRITIN: Ferritin: 321 ng/mL (ref 30–400)

## 2022-10-18 LAB — B12 AND FOLATE PANEL
Folate: 12.7 ng/mL (ref >3.0)
Vitamin B-12: 894 pg/mL (ref 232–1245)

## 2022-10-19 ENCOUNTER — Encounter: Payer: Self-pay | Admitting: Family Medicine

## 2022-10-19 DIAGNOSIS — G5692 Unspecified mononeuropathy of left upper limb: Secondary | ICD-10-CM | POA: Insufficient documentation

## 2022-10-19 NOTE — Assessment & Plan Note (Signed)
BMP ordered

## 2022-10-19 NOTE — Assessment & Plan Note (Addendum)
Chronic  Reports elevated blood glucose levels 300-400 fasting levels despite taking 70 units lantus nightly  Recommended increase to 75 units daily  Will repeat A1c today  Follow up in 2 weeks  Unable to provide urine microalbumin sample today  Counseled patient on importance of evaluating this measure to gain better understanding of kidney function, he voiced understanding  Will revisit this at his follow up appt  Renal US ordered  DM foot exam completed, follows with podiatry  Needs urine albumin testing

## 2022-10-19 NOTE — Assessment & Plan Note (Addendum)
Chronic  Uncontrolled  BP elevated today  Will collect BMP and mag as well as TSH and free T4 today to evaluate for causes of elevated BP  He will continue current regimen including amlodipine 10mg  and losartan 100mg  daily  Patient reports being in pain due to his abdominal pains, considered this is likely contributing to elevated BP  Patient's reports of taking in less PO due to abdominal pain increases concern for risk of hypotension with addition of BP agents, will have patient record ambulatory BP and follow up in 2 weeks

## 2022-10-19 NOTE — Assessment & Plan Note (Addendum)
Persistent symptoms  Patient has had negative ECG for acute MI  Symptoms continue  Considered anemia, iron deficiency, electrolyte abnormalities and vitamin deficiency as potential causes  Patient recently started on gabapentin by podiatry  Will check vitamin B12, CBC, ferritin and TSH/free T4 today  Assessed for worsened DM with repeat A1c today

## 2022-10-23 ENCOUNTER — Ambulatory Visit: Payer: Medicare Other | Attending: Cardiology | Admitting: Cardiology

## 2022-10-23 ENCOUNTER — Encounter: Payer: Self-pay | Admitting: Cardiology

## 2022-10-23 ENCOUNTER — Other Ambulatory Visit
Admission: RE | Admit: 2022-10-23 | Discharge: 2022-10-23 | Disposition: A | Discharge status: Home / Self Care | Payer: Medicare Other | Source: Ambulatory Visit | Attending: Cardiology | Admitting: Cardiology

## 2022-10-23 VITALS — BP 138/90 | HR 96 | Ht 66.0 in | Wt 212.2 lb

## 2022-10-23 DIAGNOSIS — E7800 Pure hypercholesterolemia, unspecified: Secondary | ICD-10-CM

## 2022-10-23 DIAGNOSIS — R072 Precordial pain: Secondary | ICD-10-CM | POA: Diagnosis not present

## 2022-10-23 DIAGNOSIS — I10 Essential (primary) hypertension: Secondary | ICD-10-CM

## 2022-10-23 LAB — BASIC METABOLIC PANEL
Anion gap: 10 (ref 5–15)
BUN: 12 mg/dL (ref 8–23)
CO2: 29 mmol/L (ref 22–32)
Calcium: 8.4 mg/dL — ABNORMAL LOW (ref 8.9–10.3)
Chloride: 101 mmol/L (ref 98–111)
Creatinine, Ser: 0.72 mg/dL (ref 0.61–1.24)
GFR, Estimated: >60 mL/min (ref >60)
Glucose, Bld: 154 mg/dL — ABNORMAL HIGH (ref 70–99)
Potassium: 3.4 mmol/L — ABNORMAL LOW (ref 3.5–5.1)
Sodium: 140 mmol/L (ref 135–145)

## 2022-10-23 MED ORDER — METOPROLOL TARTRATE 100 MG PO TABS: 100.0000 mg | ORAL_TABLET | Freq: Once | ORAL | 0 refills | Status: DC

## 2022-10-23 MED ORDER — IVABRADINE HCL 5 MG PO TABS: 15.0000 mg | ORAL_TABLET | Freq: Once | ORAL | 0 refills | Status: AC

## 2022-10-23 NOTE — Progress Notes (Signed)
Cardiology Office Note:    Date:  10/23/2022   ID:  Corey Petersen, DOB [birth year], MRN [id number]  PCP:  Eulis Foster, MD   [facility] Cardiologist:  Kate Sable, MD     Referring MD: Corey Petersen*   Chief Complaint  Patient presents with   Other    CP/HTN no complaints today. Meds reviewed verbally with pt.    History of Present Illness:    Corey Petersen is a 62 y.o. male with a hx of hypertension, hyperlipidemia, diabetes who presents due to chest pain.  Endorse having chest tightness over the past 4 months.  Symptoms are not associated with exertion.  States having history of therapy, OTC simethicone has not helped.  Has appointment with gastroenterology next month.  Compliant with medications as prescribed, systolic blood pressure typically in the 130s to 140s.  Sometimes has left arm numbness associated with chest tightness.  Past Medical History:  Diagnosis Date   Diabetes mellitus without complication ([place])    DKA, type 2 ([place]) 07/09/2022   Hypertension     Past Surgical History:  Procedure Laterality Date   COLONOSCOPY N/A 03/09/2022   Procedure: COLONOSCOPY;  Surgeon: Corey Lame, MD;  Location: [facility] ENDOSCOPY;  Service: Endoscopy;  Laterality: N/A;   ESOPHAGOGASTRODUODENOSCOPY N/A 03/09/2022   Procedure: ESOPHAGOGASTRODUODENOSCOPY (EGD);  Surgeon: Corey Lame, MD;  Location: [facility] ENDOSCOPY;  Service: Endoscopy;  Laterality: N/A;   HERNIA REPAIR      Current Medications: Current Meds  Medication Sig   amLODipine (NORVASC) 10 MG tablet Take 1 tablet by mouth daily.   aspirin EC 81 MG tablet Take 1 tablet (81 mg total) by mouth daily. Swallow whole.   atorvastatin (LIPITOR) 80 MG tablet TAKE ONE-HALF TABLET BY MOUTH AT BEDTIME FOR CHOLESTEROL   calcium carbonate (TUMS - DOSED IN MG ELEMENTAL CALCIUM) 500 MG chewable tablet Chew 1 tablet (200 mg of elemental calcium total) by mouth 3 (three) times daily with meals.  (Patient taking differently: Chew 200 mg of elemental calcium by mouth 3 (three) times daily with meals. As needed)   empagliflozin (JARDIANCE) 25 MG TABS tablet Take 1 tablet (25 mg total) by mouth daily.   gabapentin (NEURONTIN) 300 MG capsule Take 1 capsule (300 mg total) by mouth at bedtime.   glipiZIDE (GLUCOTROL) 10 MG tablet Take 1 tablet (10 mg total) by mouth 2 (two) times daily before a meal.   insulin glargine (LANTUS SOLOSTAR) 100 UNIT/ML Solostar Pen Inject 75 Units into the skin at bedtime.   Insulin Pen Needle 29G X 12.7MM MISC 1 Needle by Does not apply route at bedtime.   ivabradine (CORLANOR) 5 MG TABS tablet Take 3 tablets (15 mg total) by mouth once for 1 dose. TWO HOURS PRIOR TO CARDIAC CT   losartan (COZAAR) 100 MG tablet Take 1 tablet (100 mg total) by mouth daily.   metFORMIN (GLUCOPHAGE-XR) 500 MG 24 hr tablet TAKE TWO TABLETS BY MOUTH TWO TIMES A DAY FOR DIABETES NOTE DOSE INCREASE   metoprolol tartrate (LOPRESSOR) 100 MG tablet Take 1 tablet (100 mg total) by mouth once for 1 dose. TWO HOURS PRIOR TO CARDIAC CT   pantoprazole (PROTONIX) 20 MG tablet TAKE 1 TABLET BY MOUTH TWICE A DAY   tadalafil (CIALIS) 20 MG tablet Take 1 tablet (20 mg total) by mouth daily as needed for erectile dysfunction.     Allergies:   Ace inhibitors   Social History   Socioeconomic History   Marital status: Married  Spouse name: Not on file   Number of children: Not on file   Years of education: Not on file   Highest education level: Not on file  Occupational History   Not on file  Tobacco Use   Smoking status: Never   Smokeless tobacco: Never  Vaping Use   Vaping Use: Never used  Substance and Sexual Activity   Alcohol use: Never   Drug use: Never   Sexual activity: Not Currently  Other Topics Concern   Not on file  Social History Narrative   Not on file   Social Determinants of Health   Financial Resource Strain: Low Risk  (09/12/2022)   Overall Financial Resource  Strain (CARDIA)    Difficulty of Paying Living Expenses: Not very hard  Food Insecurity: No Food Insecurity (09/12/2022)   Hunger Vital Sign    Worried About Running Out of Food in the Last Year: Never true    Ran Out of Food in the Last Year: Never true  Transportation Needs: No Transportation Needs (09/12/2022)   PRAPARE - Hydrologist (Medical): No    Lack of Transportation (Non-Medical): No  Physical Activity: Inactive (09/12/2022)   Exercise Vital Sign    Days of Exercise per Week: 0 days    Minutes of Exercise per Session: 0 min  Stress: No Stress Concern Present (09/12/2022)   [place]    Feeling of Stress : Not at all  Social Connections: Unknown (09/12/2022)   Social Connection and Isolation Panel [NHANES]    Frequency of Communication with Friends and Family: More than three times a week    Frequency of Social Gatherings with Friends and Family: More than three times a week    Attends Religious Services: Not on Advertising copywriter or Organizations: Not on file    Attends Archivist Meetings: Not on file    Marital Status: Married     Family History: The patient's family history includes Hypertension in his mother.  ROS:   Please see the history of present illness.     All other systems reviewed and are negative.  EKGs/Labs/Other Studies Reviewed:    The following studies were reviewed today:   EKG:  EKG is  ordered today.  The ekg ordered today demonstrates sinus rhythm, PACs.  Recent Labs: 09/05/2022: ALT 14; Hemoglobin 17.0; Platelets 272 10/17/2022: BUN 9; Creatinine, Ser 0.93; Magnesium 2.1; Potassium 3.8; Sodium 137; TSH 1.610  Recent Lipid Panel No results found for: "CHOL", "TRIG", "HDL", "CHOLHDL", "VLDL", "LDLCALC", "LDLDIRECT"   Risk Assessment/Calculations:     HYPERTENSION CONTROL Vitals:   10/23/22 0943 10/23/22 0944  BP: (!) 140/90 (!)  138/90    The patient's blood pressure is elevated above target today.  In order to address the patient's elevated BP: Blood pressure will be monitored at home to determine if medication changes need to be made.            Physical Exam:    VS:  BP (!) 138/90 (BP Location: Right Arm, Patient Position: Sitting, Cuff Size: Large)   Pulse 96   Ht 5\' 6"  (1.676 m)   Wt 212 lb 4 oz (96.3 kg)   SpO2 98%   BMI 34.26 kg/m     Wt Readings from Last 3 Encounters:  10/23/22 212 lb 4 oz (96.3 kg)  10/17/22 209 lb (94.8 kg)  10/10/22 220 lb (99.8 kg)  GEN:  Well nourished, well developed in no acute distress HEENT: Normal NECK: No JVD; No carotid bruits CARDIAC: RRR, no murmurs, rubs, gallops RESPIRATORY:  Clear to auscultation without rales, wheezing or rhonchi  ABDOMEN: Soft, non-tender, non-distended MUSCULOSKELETAL:  No edema; No deformity  SKIN: Warm and dry NEUROLOGIC:  Alert and oriented x 3 PSYCHIATRIC:  Normal affect   ASSESSMENT:    1. Precordial pain   2. Primary hypertension   3. Pure hypercholesterolemia    PLAN:    In order of problems listed above:  Chest pain, risk factors hypertension hyperlipidemia, diabetes.  Get echo, get coronary CTA. Hypertension, BP elevated but improved.  Continue losartan,  for now.  Consider HCTZ at follow-up visit if BP stays elevated. Hyperlipidemia, continue Lipitor 80, obtain fasting lipid profile.  Follow-up after cardiac testing.     Medication Adjustments/Labs and Tests Ordered: Current medicines are reviewed at length with the patient today.  Concerns regarding medicines are outlined above.  Orders Placed This Encounter  Procedures   CT CORONARY MORPH W/CTA COR W/SCORE W/CA W/CM &/OR WO/CM   Basic Metabolic Panel (BMET)   EKG 12-Lead   ECHOCARDIOGRAM COMPLETE   Meds ordered this encounter  Medications   metoprolol tartrate (LOPRESSOR) 100 MG tablet    Sig: Take 1 tablet (100 mg total) by mouth once for 1 dose.  TWO HOURS PRIOR TO CARDIAC CT    Dispense:  1 tablet    Refill:  0   ivabradine (CORLANOR) 5 MG TABS tablet    Sig: Take 3 tablets (15 mg total) by mouth once for 1 dose. TWO HOURS PRIOR TO CARDIAC CT    Dispense:  3 tablet    Refill:  0    Patient Instructions  Medication Instructions:   Your physician recommends that you continue on your current medications as directed. Please refer to the Current Medication list given to you today.  *If you need a refill on your cardiac medications before your next appointment, please call your pharmacy*   Lab Work:  Your physician recommends you go to the medical mall for labs today.   Your physician recommends that you return for lab work at the medical mall. You will need to be fasting.  No appt is needed. Hours are M-F 7AM- 6 PM.  If you have labs (blood work) drawn today and your tests are completely normal, you will receive your results only by: [place] (if you have MyChart) OR A paper copy in the mail If you have any lab test that is abnormal or we need to change your treatment, we will call you to review the results.   Testing/Procedures:  Your physician has requested that you have an echocardiogram. Echocardiography is a painless test that uses sound waves to create images of your heart. It provides your doctor with information about the size and shape of your heart and how well your heart's chambers and valves are working. This procedure takes approximately one hour. There are no restrictions for this procedure. Please do NOT wear cologne, perfume, aftershave, or lotions (deodorant is allowed). Please arrive 15 minutes prior to your appointment time.    Your cardiac CT will be scheduled on April 15th, 2024 @ 9:30am  [facility] [address] [place], [place] [address] [phone]  If scheduled at [facility] or [facility], please arrive 15 mins  early for check-in and test prep.   Please follow these instructions carefully (  unless otherwise directed):  Hold all erectile dysfunction medications at least 3 days (72 hrs) prior to test. (Ie viagra, cialis, sildenafil, tadalafil, etc) We will administer nitroglycerin during this exam.   On the Night Before the Test: Be sure to Drink plenty of water. Do not consume any caffeinated/decaffeinated beverages or chocolate 12 hours prior to your test. Do not take any antihistamines 12 hours prior to your test.   On the Day of the Test: Drink plenty of water until 1 hour prior to the test. Do not eat any food 1 hour prior to test. You may take your regular medications prior to the test.  Take metoprolol (Lopressor) 100 mg TWO HOURS prior to test. Take Corlanor (15 mg) TWO HOURS prior to test       After the Test: Drink plenty of water. After receiving IV contrast, you may experience a mild flushed feeling. This is normal. On occasion, you may experience a mild rash up to 24 hours after the test. This is not dangerous. If this occurs, you can take Benadryl 25 mg and increase your fluid intake. If you experience trouble breathing, this can be serious. If it is severe call 911 IMMEDIATELY. If it is mild, please call our office. If you take any of these medications: Glipizide/Metformin, Avandament, Glucavance, please do not take 48 hours after completing test unless otherwise instructed.   Follow-Up: At [facility], you and your health needs are our priority.  As part of our continuing mission to provide you with exceptional heart care, we have created designated Provider Care Teams.  These Care Teams include your primary Cardiologist (physician) and Advanced Practice Providers (APPs -  Physician Assistants and Nurse Practitioners) who all work together to provide you with the care you need, when you need it.  We recommend signing up for the patient portal called "MyChart".  Sign up  information is provided on this After Visit Summary.  MyChart is used to connect with patients for Virtual Visits (Telemedicine).  Patients are able to view lab/test results, encounter notes, upcoming appointments, etc.  Non-urgent messages can be sent to your provider as well.   To learn more about what you can do with MyChart, go to [email or web address].    Your next appointment:    After testing  Provider:   You may see Kate Sable, MD or one of the following Advanced Practice Providers on your designated Care Team:   Murray Hodgkins, NP Christell Faith, PA-C Cadence Kathlen Mody, PA-C Gerrie Nordmann, NP    Signed, Kate Sable, MD  10/23/2022 10:24 AM    [place]

## 2022-10-23 NOTE — Patient Instructions (Signed)
Medication Instructions:   Your physician recommends that you continue on your current medications as directed. Please refer to the Current Medication list given to you today.  *If you need a refill on your cardiac medications before your next appointment, please call your pharmacy*   Lab Work:  Your physician recommends you go to the medical mall for labs today.   Your physician recommends that you return for lab work at the medical mall. You will need to be fasting.  No appt is needed. Hours are M-F 7AM- 6 PM.  If you have labs (blood work) drawn today and your tests are completely normal, you will receive your results only by: [place] (if you have MyChart) OR A paper copy in the mail If you have any lab test that is abnormal or we need to change your treatment, we will call you to review the results.   Testing/Procedures:  Your physician has requested that you have an echocardiogram. Echocardiography is a painless test that uses sound waves to create images of your heart. It provides your doctor with information about the size and shape of your heart and how well your heart's chambers and valves are working. This procedure takes approximately one hour. There are no restrictions for this procedure. Please do NOT wear cologne, perfume, aftershave, or lotions (deodorant is allowed). Please arrive 15 minutes prior to your appointment time.    Your cardiac CT will be scheduled on April 15th, 2024 @ 9:30am  [facility] [address] [place], [place] [address] [phone]  If scheduled at [facility] or [facility], please arrive 15 mins early for check-in and test prep.   Please follow these instructions carefully (unless otherwise directed):  Hold all erectile dysfunction medications at least 3 days (72 hrs) prior to test. (Ie viagra, cialis, sildenafil, tadalafil, etc) We will administer  nitroglycerin during this exam.   On the Night Before the Test: Be sure to Drink plenty of water. Do not consume any caffeinated/decaffeinated beverages or chocolate 12 hours prior to your test. Do not take any antihistamines 12 hours prior to your test.   On the Day of the Test: Drink plenty of water until 1 hour prior to the test. Do not eat any food 1 hour prior to test. You may take your regular medications prior to the test.  Take metoprolol (Lopressor) 100 mg TWO HOURS prior to test. Take Corlanor (15 mg) TWO HOURS prior to test       After the Test: Drink plenty of water. After receiving IV contrast, you may experience a mild flushed feeling. This is normal. On occasion, you may experience a mild rash up to 24 hours after the test. This is not dangerous. If this occurs, you can take Benadryl 25 mg and increase your fluid intake. If you experience trouble breathing, this can be serious. If it is severe call 911 IMMEDIATELY. If it is mild, please call our office. If you take any of these medications: Glipizide/Metformin, Avandament, Glucavance, please do not take 48 hours after completing test unless otherwise instructed.   Follow-Up: At [facility], you and your health needs are our priority.  As part of our continuing mission to provide you with exceptional heart care, we have created designated Provider Care Teams.  These Care Teams include your primary Cardiologist (physician) and Advanced Practice Providers (APPs -  Physician Assistants and Nurse Practitioners) who all work together to provide you with  the care you need, when you need it.  We recommend signing up for the patient portal called "MyChart".  Sign up information is provided on this After Visit Summary.  MyChart is used to connect with patients for Virtual Visits (Telemedicine).  Patients are able to view lab/test results, encounter notes, upcoming appointments, etc.  Non-urgent messages can be sent to your  provider as well.   To learn more about what you can do with MyChart, go to [email or web address].    Your next appointment:    After testing  Provider:   You may see Kate Sable, MD or one of the following Advanced Practice Providers on your designated Care Team:   Murray Hodgkins, NP Christell Faith, PA-C Cadence Kathlen Mody, PA-C Gerrie Nordmann, NP

## 2022-10-29 ENCOUNTER — Ambulatory Visit
Admission: RE | Admit: 2022-10-29 | Discharge: 2022-10-29 | Disposition: A | Discharge status: Home / Self Care | Payer: Medicare Other | Source: Ambulatory Visit | Attending: Family Medicine | Admitting: Family Medicine

## 2022-10-29 DIAGNOSIS — E119 Type 2 diabetes mellitus without complications: Secondary | ICD-10-CM | POA: Insufficient documentation

## 2022-10-29 DIAGNOSIS — N281 Cyst of kidney, acquired: Secondary | ICD-10-CM | POA: Diagnosis not present

## 2022-10-29 DIAGNOSIS — R3911 Hesitancy of micturition: Secondary | ICD-10-CM | POA: Diagnosis not present

## 2022-10-29 DIAGNOSIS — Z794 Long term (current) use of insulin: Secondary | ICD-10-CM | POA: Insufficient documentation

## 2022-10-31 ENCOUNTER — Encounter: Payer: Self-pay | Admitting: Pharmacist

## 2022-10-31 ENCOUNTER — Other Ambulatory Visit: Payer: Medicare Other | Admitting: Pharmacist

## 2022-10-31 ENCOUNTER — Telehealth: Payer: Self-pay | Admitting: Pharmacist

## 2022-10-31 ENCOUNTER — Other Ambulatory Visit: Payer: Self-pay | Admitting: Physician Assistant

## 2022-10-31 DIAGNOSIS — E119 Type 2 diabetes mellitus without complications: Secondary | ICD-10-CM

## 2022-10-31 MED ORDER — GLUCOSE BLOOD VI STRP: ORAL_STRIP | 12 refills | Status: AC

## 2022-10-31 MED ORDER — ONETOUCH VERIO W/DEVICE KIT: PACK | 0 refills | Status: AC

## 2022-10-31 NOTE — Patient Instructions (Signed)
Goals Addressed             This Visit's Progress    Pharmacy Goals       Our goal A1c is less than 7%. This corresponds with fasting sugars less than 130 and 2 hour after meal sugars less than 180. Please keep a log of your results when checking your blood sugar   Our goal bad cholesterol, or LDL, is less than 70 . This is why it is important to continue taking your atorvastatin.  Check your blood pressure once - twice weekly, and any time you have concerning symptoms like headache, chest pain, dizziness, shortness of breath, or vision changes.   Our goal is less than 130/80.  To appropriately check your blood pressure, make sure you do the following:  1) Avoid caffeine, exercise, or tobacco products for 30 minutes before checking. Empty your bladder. 2) Sit with your back supported in a flat-backed chair. Rest your arm on something flat (arm of the chair, table, etc). 3) Sit still with your feet flat on the floor, resting, for at least 5 minutes.  4) Check your blood pressure. Take 1-2 readings.  5) Write down these readings and bring with you to any provider appointments.  Bring your home blood pressure machine with you to a provider's office for accuracy comparison at least once a year.   Make sure you take your blood pressure medications before you come to any office visit, even if you were asked to fast for labs.  Leasha Goldberger Kanyla Omeara, PharmD, BCACP [facility] [phone]          

## 2022-10-31 NOTE — Progress Notes (Signed)
   Outreach Note  10/31/2022 Name: Javontay Basra MRN: [id number] DOB: 1961/04/20  Referred by: Eulis Foster, MD Reason for referral : No chief complaint on file.   Was unable to reach patient via telephone today and have left HIPAA compliant voicemail asking patient to return my call.    Follow Up Plan: Will collaborate with Care Guide to outreach to schedule follow up with me  Wallace Cullens, PharmD, [place] [phone]

## 2022-10-31 NOTE — Progress Notes (Signed)
10/31/2022 Name: Corey Petersen MRN: [id number] DOB: 1961-06-17  Chief Complaint  Patient presents with   Medication Management    Corey Petersen is a 62 y.o. year old male who presented for a telephone visit.   They were referred to the pharmacist by their PCP for assistance in managing diabetes, hypertension, and hyperlipidemia.   Today telephone conversation limited as patient having an episode of burping/emesis. Note patient scheduled for appointment with GI specialist related to this concern on 11/13/2022   Subjective:  Care Team: Primary Care Provider: Eulis Foster, MD Cardiologist: Kate Sable, MD Urologist: Hollice Espy, MD Podiatry: Garrel Ridgel, [place]; Next Scheduled Visit: 11/21/2022 Endocrinologist: Dr. Ronnald Collum; Initial Visit Scheduled: 11/12/2022 Gastroenterology: Lucilla Lame, MD; Initial Visit Scheduled: 11/13/2022  Medication Access/Adherence  Current Pharmacy:  [id number] - [place], [place] [address] [place] [address] Phone: [phone] Fax: [phone]  [facility] [address], [place] - [place] [address] Phone: [phone] Fax: [phone]   Patient reports affordability concerns with their medications: No  Patient reports access/transportation concerns to their pharmacy: No  Patient reports adherence concerns with their medications:  No    Patient uses weekly pillbox to organize his medications  Diabetes:  Note patient has been referred to Dr. De Hollingshead office - Initial appointment scheduled for 11/12/2022  Current medications:  - Glipizide 10 mg twice daily (30 minutes before breakfast and supper) - Jardiance 25 mg daily - Insulin glargine 35 units once each morning - Patient reports self-decreased his insulin dose following review of his A1C result from 3/13 - metformin ER 500 mg - 2 tablets twice daily   Current glucose readings: ranging  300s-400; last checked yesterday before lunch, recalls reading ~300  Patient denies hypoglycemic s/sx including dizziness, shakiness, sweating.   Reports current glucometer is at least 33-68 years old  Current physical activity: limited by back and hip pain; just moving around inside his home   Statin: atorvastatin 80 mg - taking 1/2 tablet (40 mg) daily  :   Objective:  Lab Results  Component Value Date   HGBA1C 4.8 10/17/2022    Lab Results  Component Value Date   CREATININE 0.72 10/23/2022   BUN 12 10/23/2022   NA 140 10/23/2022   K 3.4 (L) 10/23/2022   CL 101 10/23/2022   CO2 29 10/23/2022    No results found for: "CHOL", "HDL", "LDLCALC", "LDLDIRECT", "TRIG", "CHOLHDL"  Medications Reviewed Today     Reviewed by Kate Sable, MD (Physician) on 10/23/22 at 1001  Med List Status: <None>   Medication Order Taking? Sig Documenting Provider Last Dose Status Informant  amLODipine (NORVASC) 10 MG tablet [id number] Yes Take 1 tablet by mouth daily. [provider] Taking Active   aspirin EC 81 MG tablet [id number] Yes Take 1 tablet (81 mg total) by mouth daily. Swallow whole. Mikey Kirschner, PA-C Taking Active   atorvastatin (LIPITOR) 80 MG tablet [id number] Yes TAKE ONE-HALF TABLET BY MOUTH AT BEDTIME FOR CHOLESTEROL [provider] Taking Active   calcium carbonate (TUMS - DOSED IN MG ELEMENTAL CALCIUM) 500 MG chewable tablet [id number] Yes Chew 1 tablet (200 mg of elemental calcium total) by mouth 3 (three) times daily with meals.  Patient taking differently: Chew 200 mg of elemental calcium by mouth 3 (three) times daily with meals. As needed   Lorella Nimrod, MD Taking Active   empagliflozin (JARDIANCE) 25 MG TABS tablet [id number] Yes Take 1 tablet (25 mg  total) by mouth daily. Mikey Kirschner, PA-C Taking Active   gabapentin (NEURONTIN) 300 MG capsule [id number] Yes Take 1 capsule (300 mg total) by mouth at bedtime. Hyatt, Max T, DPM Taking Active    glipiZIDE (GLUCOTROL) 10 MG tablet [id number] Yes Take 1 tablet (10 mg total) by mouth 2 (two) times daily before a meal. Mikey Kirschner, PA-C Taking Active   insulin glargine (LANTUS SOLOSTAR) 100 UNIT/ML Solostar Pen [id number] Yes Inject 75 Units into the skin at bedtime. Simmons-Robinson, Makiera, MD Taking Active   Insulin Pen Needle 29G X 12.7MM MISC [id number] Yes 1 Needle by Does not apply route at bedtime. Simmons-Robinson, Makiera, MD Taking Active   ivabradine (CORLANOR) 5 MG TABS tablet [id number] Yes Take 3 tablets (15 mg total) by mouth once for 1 dose. TWO HOURS PRIOR TO CARDIAC CT Kate Sable, MD  Active   losartan (COZAAR) 100 MG tablet [id number] Yes Take 1 tablet (100 mg total) by mouth daily. Mikey Kirschner, PA-C Taking Active   metFORMIN (GLUCOPHAGE-XR) 500 MG 24 hr tablet [id number] Yes TAKE TWO TABLETS BY MOUTH TWO TIMES A DAY FOR DIABETES NOTE DOSE INCREASE Mikey Kirschner, PA-C Taking Active            Med Note Curley Spice, Mara Favero A   Wed Sep 19, 2022 12:42 PM) Reports currently taking 1 tablet twice daily  metoprolol tartrate (LOPRESSOR) 100 MG tablet [id number] Yes Take 1 tablet (100 mg total) by mouth once for 1 dose. TWO HOURS PRIOR TO CARDIAC CT Kate Sable, MD  Active   pantoprazole (PROTONIX) 20 MG tablet [id number] Yes TAKE 1 TABLET BY MOUTH TWICE A DAY Simmons-Robinson, Makiera, MD Taking Active   tadalafil (CIALIS) 20 MG tablet [id number] Yes Take 1 tablet (20 mg total) by mouth daily as needed for erectile dysfunction. Hollice Espy, MD Taking Active               Assessment/Plan:   Today telephone conversation limited as patient having an episode of burping/emesis. Note patient scheduled for appointment with GI specialist related to this concern on 11/13/2022  Diabetes:  - Patient to keep upcoming appointment with Dr. Ronnald Collum on 4/8 at 10:30 am  Collaborate with Endocrinology office today on behalf of patient Advise patient to complete  paperwork as mailed by Endocrinology office and to bring his insurance card, glucometer and current medications to appointment as requested  Provide patient with office phone number - Reviewed long term cardiovascular and renal outcomes of uncontrolled blood sugar - Counsel patient on sick day rules for Jardiance - to hold with vomiting or severe diarrhea and to restart when able to resume eating and drinking - Reviewed dietary modifications including importance of having regular well-balanced meals and snacks, while controlling carbohydrate portion sizes  Decreasing intake of sugary beverages, drinking water instead             Review nutrition labels for carbohydrate content of foods - Discuss with patient the importance of close monitoring of his blood sugar, particularly given difference between home readings and lab work - Recommend use of continuous glucose monitor (CGM) to aid with blood sugar control, but patient declines - Collaborate with PA Mikey Kirschner, covering for PCP, via [facility] provider send prescriptions for OneTouch Verio meter, test strips and lancets to pharmacy for patient as patient reporting current glucometer >45-92 years old Let provider know have advised patient to follow sick day rules for Jardiance to reduce risk of dehydration and DKA Discuss patient's current diabetes medication  regimen including patient's current self-reduced dose of Lantus, home readings versus latest labs - Follow up with patient to advise him to pick up new glucometer from pharmacy and to start checking blood sugar at least twice daily (including morning fasting), bring glucometer to upcoming appointment with Endocrinologist for review and to contact office sooner if needed for readings outside of established parameters with new meter or for new/worsening symptoms - Will send patient link for One Touch Verio "how to use" video via MyChart message, as requested  Offer follow up in 1 week,  but patient prefers next follow up with clinical pharmacist after appointments with Endocrinology and GI Specialist  Follow Up Plan: Clinical Pharmacist will follow up with patient by telephone on 11/21/2022 at 2:30 PM to follow up regarding T2DM and HTN  Wallace Cullens, PharmD, [place] [phone]

## 2022-11-13 ENCOUNTER — Encounter: Payer: Self-pay | Admitting: Gastroenterology

## 2022-11-13 ENCOUNTER — Ambulatory Visit (INDEPENDENT_AMBULATORY_CARE_PROVIDER_SITE_OTHER): Payer: Medicare Other | Admitting: Gastroenterology

## 2022-11-13 VITALS — BP 155/106 | HR 89 | Temp 98.4°F | Wt 214.0 lb

## 2022-11-13 DIAGNOSIS — R112 Nausea with vomiting, unspecified: Secondary | ICD-10-CM | POA: Diagnosis not present

## 2022-11-13 NOTE — Patient Instructions (Addendum)
Blood pressure rechecked  Emptying study has been scheduled at [facility]  Arrive at 7:45am on Friday Dec 07, 2022  If you need to cancel or reschedule, please call [phone]  You cannot have anything to eat or drink after 12 midnight the day before   Hold Pantoprazole day before

## 2022-11-13 NOTE — Progress Notes (Signed)
Primary Care Physician: Ronnald Ramp, MD  Primary Gastroenterologist:  Dr. Midge Minium  Chief Complaint  Patient presents with   Abdominal Pain    and bloating with burping   Emesis    HPI: Corey Petersen is a 62 y.o. male here with a history of nausea vomiting with abdominal bloating burping dyspepsia with as reported my last note started after he feels that he was given a weight loss medication against his well and his beer that was brought by his wife from [employer].  The patient had an EGD and colonoscopy with a few small polyps removed and his upper endoscopy showed some esophagitis consistent with his imaging that showed thickened lower esophagus consistent with reflux.  The patient says that he has a bandlike feeling when he stands up and feels like he is getting obstruction.  He also reports that he vomits many hours after he eats.  Past Medical History:  Diagnosis Date   Diabetes mellitus without complication    DKA, type 2 07/09/2022   Hypertension     Current Outpatient Medications  Medication Sig Dispense Refill   amLODipine (NORVASC) 10 MG tablet Take 1 tablet by mouth daily.     aspirin EC 81 MG tablet Take 1 tablet (81 mg total) by mouth daily. Swallow whole. 30 tablet 12   atorvastatin (LIPITOR) 80 MG tablet TAKE ONE-HALF TABLET BY MOUTH AT BEDTIME FOR CHOLESTEROL     Blood Glucose Monitoring Suppl (ONETOUCH VERIO) w/Device KIT Use to check blood sugar before meals and fasting, as as needed 1 kit 0   calcium carbonate (TUMS - DOSED IN MG ELEMENTAL CALCIUM) 500 MG chewable tablet Chew 1 tablet (200 mg of elemental calcium total) by mouth 3 (three) times daily with meals. (Patient taking differently: Chew 200 mg of elemental calcium by mouth 3 (three) times daily with meals. As needed) 90 tablet 1   empagliflozin (JARDIANCE) 25 MG TABS tablet Take 1 tablet (25 mg total) by mouth daily. 90 tablet 1   gabapentin (NEURONTIN) 300 MG capsule Take 1 capsule (300 mg  total) by mouth at bedtime. 90 capsule 3   glipiZIDE (GLUCOTROL) 10 MG tablet Take 1 tablet (10 mg total) by mouth 2 (two) times daily before a meal. 180 tablet 1   glucose blood test strip Use as instructed 100 each 12   insulin glargine (LANTUS SOLOSTAR) 100 UNIT/ML Solostar Pen Inject 75 Units into the skin at bedtime. 15 mL PRN   Insulin Pen Needle 29G X 12.7MM MISC 1 Needle by Does not apply route at bedtime. 100 each 3   losartan (COZAAR) 100 MG tablet Take 1 tablet (100 mg total) by mouth daily. 90 tablet 1   metFORMIN (GLUCOPHAGE-XR) 500 MG 24 hr tablet TAKE TWO TABLETS BY MOUTH TWO TIMES A DAY FOR DIABETES NOTE DOSE INCREASE 180 tablet 1   pantoprazole (PROTONIX) 20 MG tablet TAKE 1 TABLET BY MOUTH TWICE A DAY 60 tablet 0   tadalafil (CIALIS) 20 MG tablet Take 1 tablet (20 mg total) by mouth daily as needed for erectile dysfunction. 30 tablet 0   metoprolol tartrate (LOPRESSOR) 100 MG tablet Take 1 tablet (100 mg total) by mouth once for 1 dose. TWO HOURS PRIOR TO CARDIAC CT 1 tablet 0   No current facility-administered medications for this visit.    Allergies as of 11/13/2022 - Review Complete 11/13/2022  Allergen Reaction Noted   Ace inhibitors Cough 03/15/2016    ROS:  General: Negative for anorexia,  weight loss, fever, chills, fatigue, weakness. ENT: Negative for hoarseness, difficulty swallowing , nasal congestion. CV: Negative for chest pain, angina, palpitations, dyspnea on exertion, peripheral edema.  Respiratory: Negative for dyspnea at rest, dyspnea on exertion, cough, sputum, wheezing.  GI: See history of present illness. GU:  Negative for dysuria, hematuria, urinary incontinence, urinary frequency, nocturnal urination.  Endo: Negative for unusual weight change.    Physical Examination:   BP (!) 169/83 (BP Location: Left Arm, Patient Position: Sitting, Cuff Size: Large)   Pulse 89   Temp 98.4 F (36.9 C) (Oral)   Wt 214 lb (97.1 kg)   BMI 34.54 kg/m    General: Well-nourished, well-developed in no acute distress.  Eyes: No icterus. Conjunctivae pink. Lungs: Clear to auscultation bilaterally. Non-labored. Neuro: Alert and oriented x 3.  Grossly intact. Skin: Warm and dry, no jaundice.   Psych: Alert and cooperative, normal mood and affect.  Labs:    Imaging Studies: US Renal  Result Date: 10/29/2022 CLINICAL DATA:  CVA tenderness, urinary hesitancy EXAM: RENAL / URINARY TRACT ULTRASOUND COMPLETE COMPARISON:  None Available. FINDINGS: Right Kidney: Renal measurements: 9.8 x 5.2 x 5.2 cm = volume: 148 mL. Echogenicity within normal limits. No mass or hydronephrosis visualized. Left Kidney: Renal measurements: 11.9 x 6.8 x 5.8 cm = volume: 244 mL. Echogenicity within normal limits. No mass or hydronephrosis visualized. There is a large simple cyst in the left kidney measuring 8.4 cm. Bladder: Appears normal for degree of bladder distention. Other: None. IMPRESSION: No acute finding in the kidneys. Large left simple renal cyst measuring 8.4 cm. Electronically Signed   By: Emmaline Kluver M.Corey.   On: 10/29/2022 14:53    Assessment and Plan:   Dmari Petersen is a 62 y.o. y/o male who comes in today with a history of nausea vomiting burping with an EGD and colonoscopy not showing any source for his symptoms.  The patient has been told that this may have components of anxiety as the cause.  The patient insist that he may have a blockage and that he vomits food that he has eaten many hours prior.  The patient will be set up for a gastric emptying study and a small bowel follow-through to look for any obstructive cause for his symptoms.     Midge Minium, MD. [facility]    Note: This dictation was prepared with Dragon dictation along with smaller phrase technology. Any transcriptional errors that result from this process are unintentional.

## 2022-11-14 ENCOUNTER — Other Ambulatory Visit: Payer: Self-pay | Admitting: Cardiology

## 2022-11-14 DIAGNOSIS — R072 Precordial pain: Secondary | ICD-10-CM

## 2022-11-14 DIAGNOSIS — E7800 Pure hypercholesterolemia, unspecified: Secondary | ICD-10-CM

## 2022-11-14 DIAGNOSIS — I10 Essential (primary) hypertension: Secondary | ICD-10-CM

## 2022-11-15 ENCOUNTER — Ambulatory Visit: Payer: Medicare Other | Attending: Cardiology

## 2022-11-15 ENCOUNTER — Telehealth (HOSPITAL_COMMUNITY): Payer: Self-pay | Admitting: *Deleted

## 2022-11-15 DIAGNOSIS — R072 Precordial pain: Secondary | ICD-10-CM

## 2022-11-15 LAB — ECHOCARDIOGRAM COMPLETE
AR max vel: 2.92 cm2
AV Area VTI: 2.8 cm2
AV Area mean vel: 2.74 cm2
AV Mean grad: 3 mmHg
AV Peak grad: 5.7 mmHg
Ao pk vel: 1.19 m/s
Area-P 1/2: 3.99 cm2
S' Lateral: 3.8 cm

## 2022-11-15 MED ORDER — PERFLUTREN LIPID MICROSPHERE
1.0000 mL | INTRAVENOUS | Status: AC | PRN
  Administered 2022-11-15: 2 mL via INTRAVENOUS

## 2022-11-15 NOTE — Telephone Encounter (Signed)
Reaching out to patient to offer assistance regarding upcoming cardiac imaging study; pt verbalizes understanding of appt date/time, parking situation and where to check in, pre-test NPO status and medications ordered, and verified current allergies; name and call back number provided for further questions should they arise  Devun Anna RN Navigator Cardiac [facility] [phone] office [phone] cell  Patient to take 100mg metoprolol tartrate and 15mg ivabradine two hours prior to his cardiac CT scan. 

## 2022-11-19 ENCOUNTER — Ambulatory Visit
Admission: RE | Admit: 2022-11-19 | Discharge: 2022-11-19 | Disposition: A | Discharge status: Home / Self Care | Payer: Medicare Other | Source: Ambulatory Visit | Attending: Cardiology | Admitting: Cardiology

## 2022-11-19 ENCOUNTER — Other Ambulatory Visit: Payer: Self-pay | Admitting: Family Medicine

## 2022-11-19 DIAGNOSIS — E7800 Pure hypercholesterolemia, unspecified: Secondary | ICD-10-CM | POA: Insufficient documentation

## 2022-11-19 DIAGNOSIS — I700 Atherosclerosis of aorta: Secondary | ICD-10-CM | POA: Diagnosis not present

## 2022-11-19 MED ORDER — NITROGLYCERIN 0.4 MG SL SUBL
0.8000 mg | SUBLINGUAL_TABLET | Freq: Once | SUBLINGUAL | Status: AC
  Administered 2022-11-19: 0.8 mg via SUBLINGUAL

## 2022-11-19 MED ORDER — METOPROLOL TARTRATE 5 MG/5ML IV SOLN
10.0000 mg | Freq: Once | INTRAVENOUS | Status: AC
  Administered 2022-11-19: 10 mg via INTRAVENOUS

## 2022-11-19 MED ORDER — METOPROLOL TARTRATE 5 MG/5ML IV SOLN
INTRAVENOUS | Status: AC
  Filled 2022-11-19: qty 10

## 2022-11-19 MED ORDER — IOHEXOL 350 MG/ML SOLN
100.0000 mL | Freq: Once | INTRAVENOUS | Status: AC | PRN
  Administered 2022-11-19: 100 mL via INTRAVENOUS

## 2022-11-19 MED ORDER — ATORVASTATIN CALCIUM 40 MG PO TABS: 40.0000 mg | ORAL_TABLET | Freq: Every day | ORAL | 3 refills | Status: DC

## 2022-11-19 NOTE — Progress Notes (Signed)
Patient tolerated procedure well. Ambulate w/o difficulty. Denies light headedness or being dizzy. Encouraged to drink extra water today and reasoning explained. Verbalized understanding. All questions answered. ABC intact. No further needs. Discharge from procedure area w/o issues.   

## 2022-11-21 ENCOUNTER — Other Ambulatory Visit: Payer: Medicare Other | Admitting: Pharmacist

## 2022-11-21 ENCOUNTER — Ambulatory Visit (INDEPENDENT_AMBULATORY_CARE_PROVIDER_SITE_OTHER): Payer: No Typology Code available for payment source | Admitting: Podiatry

## 2022-11-21 ENCOUNTER — Telehealth: Payer: Self-pay | Admitting: Pharmacist

## 2022-11-21 ENCOUNTER — Encounter: Payer: Self-pay | Admitting: Podiatry

## 2022-11-21 DIAGNOSIS — E1142 Type 2 diabetes mellitus with diabetic polyneuropathy: Secondary | ICD-10-CM

## 2022-11-21 NOTE — Progress Notes (Signed)
   Outreach Note  11/21/2022 Name: Corey Petersen MRN: [id number] DOB: November 02, 1960  Referred by: Ronnald Ramp, MD Reason for referral : No chief complaint on file.  Was unable to reach patient via telephone today. Reach patient's wife by telephone who reports patient is not feeling well and currently laying down. Requests to reschedule appointment for patient.   Follow Up Plan: As requested, reschedule appointment for 12/05/2022 at 1:30 pm  Estelle Grumbles, PharmD, [facility] Clinical Pharmacist [facility] [phone]

## 2022-11-21 NOTE — Progress Notes (Signed)
Corey Petersen presents today for follow-up of his diabetic neuropathy he states that they are really not feeling any better he has been taking his gabapentin at nighttime but my feet are does hurt all the time now he says says it feels like they are numb and thick with burning tingling sensations.  He presents today with severe gastric bloating and belching.  States that he has a severe GI issue and is scheduled for surgery or a procedure the first part of May.  Objective: Vital signs are stable he is alert and oriented x 3.  Pulses are palpable.  No significant change in physical exam.  Assessment: Neuropathy.  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  Plan: I am going to send him to neurology for evaluation and treatment.  I recommended that he follow-up with the emergency department for his belching and bloating and the pain that he seems to be in at this time.

## 2022-11-25 NOTE — Progress Notes (Deleted)
  Cardiology Office Note:   Date:  11/25/2022  ID:  Corey Petersen, DOB [birth year], MRN [id number]  History of Present Illness:   Corey Petersen is a 62 y.o. male with a past medical history of hypertension, hyperlipidemia, diabetes, sleep apnea, osteoarthritis, who is here to follow up on previous complaints of chest pain.  He was last seen in clinic 10/23/22 by Dr Azucena Cecil with complaints of chest tightness for over the last 4 months. Symptoms were not associated with exertion but with occasional left arm numbness and had a scheduled follow up with GI over concerns as well. He was scheduled for a coronary CTA and echocardiogram and fasting lipid panel.  He returns to clinic today  ROS: ***  Studies Reviewed:    EKG:  ***  Coronary CTA 11/19/22 IMPRESSION: 1. Normal coronary calcium score of 0.  Patient is low risk.   2. Normal coronary origin with left dominance.   3. No evidence of CAD.   4. CAD-RADS 0. Consider non-atherosclerotic causes of chest pain.  Echo 11/15/2022 1. Left ventricular ejection fraction, by estimation, is 60 to 65%. The  left ventricle has normal function. The left ventricle has no regional  wall motion abnormalities. There is severe concentric left ventricular  hypertrophy with mid to apical cavity  obliteration in systole. No LVOT gradient measured. Left ventricular  diastolic parameters are consistent with Grade I diastolic dysfunction  (impaired relaxation).   2. Right ventricular systolic function is normal. The right ventricular  size is normal. Mildly increased right ventricular wall thickness.  Tricuspid regurgitation signal is inadequate for assessing PA pressure.   3. Left atrial size was mildly dilated.   4. The mitral valve is normal in structure. No evidence of mitral valve  regurgitation. No evidence of mitral stenosis.   5. The aortic valve has an indeterminant number of cusps. Aortic valve  regurgitation is not visualized. No aortic  stenosis is present.   6. There is borderline dilatation of the ascending aorta, measuring 37  mm.   7. The inferior vena cava is normal in size with greater than 50%  respiratory variability, suggesting right atrial pressure of 3 mmHg.   8. Consider w/u for LVH if clinically indicated.    Risk Assessment/Calculations:   {Does this patient have ATRIAL FIBRILLATION?:[phone]} No BP recorded.  {Refresh Note OR Click here to enter BP  :1}***        Physical Exam:   VS:  There were no vitals taken for this visit.   Wt Readings from Last 3 Encounters:  11/13/22 214 lb (97.1 kg)  10/23/22 212 lb 4 oz (96.3 kg)  10/17/22 209 lb (94.8 kg)     GEN: Well nourished, well developed in no acute distress NECK: No JVD; No carotid bruits CARDIAC: ***RRR, no murmurs, rubs, gallops RESPIRATORY:  Clear to auscultation without rales, wheezing or rhonchi  ABDOMEN: Soft, non-tender, non-distended EXTREMITIES:  No edema; No deformity   ASSESSMENT AND PLAN:   ***    {Are you ordering a CV Procedure (e.g. stress test, cath, DCCV, TEE, etc)?   Press F2        :[id number]}   Signed, Baldwin Racicot, NP

## 2022-11-26 ENCOUNTER — Ambulatory Visit: Payer: Medicare Other | Attending: Cardiology | Admitting: Cardiology

## 2022-11-26 DIAGNOSIS — I10 Essential (primary) hypertension: Secondary | ICD-10-CM

## 2022-11-26 DIAGNOSIS — E7800 Pure hypercholesterolemia, unspecified: Secondary | ICD-10-CM

## 2022-11-26 DIAGNOSIS — G4730 Sleep apnea, unspecified: Secondary | ICD-10-CM

## 2022-12-05 ENCOUNTER — Other Ambulatory Visit: Payer: Medicare Other | Admitting: Pharmacist

## 2022-12-05 ENCOUNTER — Telehealth: Payer: Self-pay | Admitting: Pharmacist

## 2022-12-05 NOTE — Progress Notes (Signed)
   Outreach Note  12/05/2022 Name: Corey Petersen MRN: [id number] DOB: 1961/04/15  Referred by: Ronnald Ramp, MD Reason for referral : No chief complaint on file.   Was unable to reach patient via telephone today and unable to leave a message as patient's voicemail is full.    Follow Up Plan: Will collaborate with Care Guide to outreach to schedule follow up with me  Estelle Grumbles, PharmD, [facility] Clinical Pharmacist [facility] [phone]

## 2022-12-07 ENCOUNTER — Encounter
Admission: RE | Admit: 2022-12-07 | Discharge: 2022-12-07 | Disposition: A | Discharge status: Home / Self Care | Payer: Medicare Other | Source: Ambulatory Visit | Attending: Gastroenterology | Admitting: Gastroenterology

## 2022-12-07 DIAGNOSIS — R112 Nausea with vomiting, unspecified: Secondary | ICD-10-CM | POA: Diagnosis not present

## 2022-12-07 MED ORDER — TECHNETIUM TC 99M SULFUR COLLOID
2.1200 | Freq: Once | INTRAVENOUS | Status: AC | PRN
  Administered 2022-12-07: 2.12 via ORAL

## 2022-12-26 ENCOUNTER — Other Ambulatory Visit: Payer: Medicare Other | Admitting: Pharmacist

## 2022-12-26 ENCOUNTER — Telehealth: Payer: Self-pay | Admitting: Pharmacist

## 2022-12-26 NOTE — Progress Notes (Signed)
   Outreach Note  12/26/2022 Name: Deionte Delledonne MRN: [id number] DOB: 04/08/1961  Referred by: Ronnald Ramp, MD Reason for referral : No chief complaint on file.   Was unable to reach patient via telephone today and have left HIPAA compliant voicemail asking patient to return my call. Outreach attempt #3.   Follow Up Plan: Will collaborate with PCP as unable to reach patient. Pharmacist available for future outreach to patient if following discussion with PCP, patient/provider interested in re-referral.  Estelle Grumbles, PharmD, [facility] Clinical Pharmacist [facility] [phone]

## 2023-02-12 ENCOUNTER — Ambulatory Visit: Payer: Medicare Other

## 2023-02-20 ENCOUNTER — Ambulatory Visit: Payer: Medicare Other | Admitting: Diagnostic Neuroimaging

## 2023-02-20 ENCOUNTER — Other Ambulatory Visit: Payer: Self-pay | Admitting: Family Medicine

## 2023-02-20 ENCOUNTER — Encounter: Payer: Self-pay | Admitting: Diagnostic Neuroimaging

## 2023-02-20 VITALS — BP 184/94 | HR 67 | Ht 66.0 in | Wt 233.4 lb

## 2023-02-20 DIAGNOSIS — E1142 Type 2 diabetes mellitus with diabetic polyneuropathy: Secondary | ICD-10-CM | POA: Diagnosis not present

## 2023-02-20 MED ORDER — GABAPENTIN 300 MG PO CAPS: 300.0000 mg | ORAL_CAPSULE | Freq: Three times a day (TID) | ORAL | 3 refills | Status: AC

## 2023-02-20 MED ORDER — AMLODIPINE BESYLATE 10 MG PO TABS: 10.0000 mg | ORAL_TABLET | Freq: Every day | ORAL | 3 refills | Status: DC

## 2023-02-20 NOTE — Telephone Encounter (Signed)
Received secure chat from neurology, Dr. Marjory Lies requesting refills for amlodipine noting elevated blood pressure   Refills sent to pharmacy   Ronnald Ramp, MD  [facility]

## 2023-02-20 NOTE — Patient Instructions (Signed)
  NUMBNESS, PAIN, SWELLING IN FEET (since ~2023; could be related to diabetic neuropathy, decreased activity; also chronic low back pain and chronic hip pain) - continue diabetes control (per PCP) - continue gabapentin 300mg  at bedtime; may increase up to 300-600mg  three times a day  - Other painful neuropathy treatment options: -duloxetine 30-60mg  daily, amitriptyline 25-50mg  at bedtime, pregabalin 75-150mg  twice a day - consider capsaicin cream, lidocaine patch / cream, alpha-lipoic acid 600mg  daily  HYPERTENSION! - patient is out of amlodipine (previously rx'd by [facility]) - follow up with PCP asap

## 2023-02-20 NOTE — Progress Notes (Signed)
[facility]  PATIENT: Corey Petersen DOB: 11/10/1960  REFERRING CLINICIAN: Hyatt, Max T, DPM HISTORY FROM: patient  REASON FOR VISIT: new consult   HISTORICAL  CHIEF COMPLAINT:  Chief Complaint  Patient presents with   New Patient (Initial Visit)    Patient in room #7 and alone. Patient states both his feet are swollen and has pain.    HISTORY OF PRESENT ILLNESS:   62 year old male here for evaluation of numbness and swelling in feet.  Symptoms started around 2023.  Has long history of diabetes on medical management.  Also with hypertension, suboptimally controlled.  Previously getting medical care through the [place] in [place], now established with [facility].  He has not been able to get his amlodipine refilled.  Has been prescribed gabapentin for diabetic nerve pain but has only tried 20 mg at bedtime and this just makes him sleepy without relief of pain.  Also has some chronic low back pain and chronic bilateral hip pain.  He was told he needed hip replacement surgeries but he is reluctant to pursue this.  He has a 20-year history of working as a [employer], now retired for past 5 years.  He has limited mobility and activity due to his pain issues.   REVIEW OF SYSTEMS: Full 14 system review of systems performed and negative with exception of: as per HPI.  ALLERGIES: Allergies  Allergen Reactions   Ace Inhibitors Cough    HOME MEDICATIONS: Outpatient Medications Prior to Visit  Medication Sig Dispense Refill   amLODipine (NORVASC) 10 MG tablet Take 1 tablet by mouth daily.     aspirin EC 81 MG tablet Take 1 tablet (81 mg total) by mouth daily. Swallow whole. 30 tablet 12   atorvastatin (LIPITOR) 40 MG tablet Take 1 tablet (40 mg total) by mouth daily. 90 tablet 3   Blood Glucose Monitoring Suppl (ONETOUCH VERIO) w/Device KIT Use to check blood sugar before meals and fasting, as as needed 1 kit 0   calcium carbonate (TUMS - DOSED IN MG  ELEMENTAL CALCIUM) 500 MG chewable tablet Chew 1 tablet (200 mg of elemental calcium total) by mouth 3 (three) times daily with meals. (Patient taking differently: Chew 200 mg of elemental calcium by mouth 3 (three) times daily with meals. As needed) 90 tablet 1   CORLANOR 5 MG TABS tablet Take by mouth.     empagliflozin (JARDIANCE) 25 MG TABS tablet Take 1 tablet (25 mg total) by mouth daily. 90 tablet 1   glipiZIDE (GLUCOTROL) 10 MG tablet Take 1 tablet (10 mg total) by mouth 2 (two) times daily before a meal. 180 tablet 1   glucose blood test strip Use as instructed 100 each 12   insulin glargine (LANTUS SOLOSTAR) 100 UNIT/ML Solostar Pen Inject 75 Units into the skin at bedtime. 15 mL PRN   Insulin Pen Needle 29G X 12.7MM MISC 1 Needle by Does not apply route at bedtime. 100 each 3   losartan (COZAAR) 100 MG tablet Take 1 tablet (100 mg total) by mouth daily. 90 tablet 1   metFORMIN (GLUCOPHAGE-XR) 500 MG 24 hr tablet TAKE TWO TABLETS BY MOUTH TWO TIMES A DAY FOR DIABETES NOTE DOSE INCREASE 180 tablet 1   pantoprazole (PROTONIX) 20 MG tablet TAKE 1 TABLET BY MOUTH TWICE A DAY 60 tablet 0   tadalafil (CIALIS) 20 MG tablet Take 1 tablet (20 mg total) by mouth daily as needed for erectile dysfunction. 30 tablet 0   gabapentin (NEURONTIN)  300 MG capsule Take 1 capsule (300 mg total) by mouth at bedtime. 90 capsule 3   metoprolol tartrate (LOPRESSOR) 100 MG tablet Take 1 tablet (100 mg total) by mouth once for 1 dose. TWO HOURS PRIOR TO CARDIAC CT 1 tablet 0   No facility-administered medications prior to visit.    PAST MEDICAL HISTORY: Past Medical History:  Diagnosis Date   Diabetes mellitus without complication (HCC)    DKA, type 2 (HCC) 07/09/2022   Hypertension     PAST SURGICAL HISTORY: Past Surgical History:  Procedure Laterality Date   COLONOSCOPY N/A 03/09/2022   Procedure: COLONOSCOPY;  Surgeon: Midge Minium, MD;  Location: [facility] ENDOSCOPY;  Service: Endoscopy;  Laterality: N/A;    ESOPHAGOGASTRODUODENOSCOPY N/A 03/09/2022   Procedure: ESOPHAGOGASTRODUODENOSCOPY (EGD);  Surgeon: Midge Minium, MD;  Location: [facility] ENDOSCOPY;  Service: Endoscopy;  Laterality: N/A;   HERNIA REPAIR      FAMILY HISTORY: Family History  Problem Relation Age of Onset   Hypertension Mother     SOCIAL HISTORY: Social History   Socioeconomic History   Marital status: Married    Spouse name: Not on file   Number of children: Not on file   Years of education: Not on file   Highest education level: Not on file  Occupational History   Not on file  Tobacco Use   Smoking status: Never   Smokeless tobacco: Never  Vaping Use   Vaping status: Never Used  Substance and Sexual Activity   Alcohol use: Never   Drug use: Never   Sexual activity: Not Currently  Other Topics Concern   Not on file  Social History Narrative   Not on file   Social Determinants of Health   Financial Resource Strain: Low Risk  (09/12/2022)   Overall Financial Resource Strain (CARDIA)    Difficulty of Paying Living Expenses: Not very hard  Food Insecurity: No Food Insecurity (09/12/2022)   Hunger Vital Sign    Worried About Running Out of Food in the Last Year: Never true    Ran Out of Food in the Last Year: Never true  Transportation Needs: No Transportation Needs (09/12/2022)   PRAPARE - Administrator, Civil Service (Medical): No    Lack of Transportation (Non-Medical): No  Physical Activity: Inactive (09/12/2022)   Exercise Vital Sign    Days of Exercise per Week: 0 days    Minutes of Exercise per Session: 0 min  Stress: No Stress Concern Present (09/12/2022)   [employer] of Occupational Health - Occupational Stress Questionnaire    Feeling of Stress : Not at all  Social Connections: Unknown (09/12/2022)   Social Connection and Isolation Panel [NHANES]    Frequency of Communication with Friends and Family: More than three times a week    Frequency of Social Gatherings with Friends and  Family: More than three times a week    Attends Religious Services: Not on file    Active Member of Clubs or Organizations: Not on file    Attends Banker Meetings: Not on file    Marital Status: Married  Intimate Partner Violence: Not At Risk (07/09/2022)   Humiliation, Afraid, Rape, and Kick questionnaire    Fear of Current or Ex-Partner: No    Emotionally Abused: No    Physically Abused: No    Sexually Abused: No     PHYSICAL EXAM  GENERAL EXAM/CONSTITUTIONAL: Vitals:  Vitals:   02/20/23 0852  BP: (!) 184/94  Pulse: 67  Weight:  233 lb 6.4 oz (105.9 kg)  Height: 5\' 6"  (1.676 m)   Body mass index is 37.67 kg/m. Wt Readings from Last 3 Encounters:  02/20/23 233 lb 6.4 oz (105.9 kg)  02/12/23 214 lb (97.1 kg)  11/13/22 214 lb (97.1 kg)   Patient is in no distress; well developed, nourished and groomed; neck is supple  CARDIOVASCULAR: Examination of carotid arteries is normal; no carotid bruits Regular rate and rhythm, no murmurs Examination of peripheral vascular system by observation and palpation is normal  EYES: Ophthalmoscopic exam of optic discs and posterior segments is normal; no papilledema or hemorrhages No results found.  MUSCULOSKELETAL: Gait, strength, tone, movements noted in Neurologic exam below  NEUROLOGIC: MENTAL STATUS:      No data to display         awake, alert, oriented to person, place and time recent and remote memory intact normal attention and concentration language fluent, comprehension intact, naming intact fund of knowledge appropriate  CRANIAL NERVE:  2nd - no papilledema on fundoscopic exam 2nd, 3rd, 4th, 6th - pupils equal and reactive to light, visual fields full to confrontation, extraocular muscles intact, no nystagmus 5th - facial sensation symmetric 7th - facial strength symmetric 8th - hearing intact 9th - palate elevates symmetrically, uvula midline 11th - shoulder shrug symmetric 12th - tongue  protrusion midline  MOTOR:  normal bulk and tone, full strength in the BUE, BLE  SENSORY:  normal and symmetric to light touch, pinprick, temperature, vibration  COORDINATION:  finger-nose-finger, fine finger movements normal  REFLEXES:  deep tendon reflexes present and symmetric  GAIT/STATION:  narrow based gait; able to walk on toes, heels and tandem; romberg is negative     DIAGNOSTIC DATA (LABS, IMAGING, TESTING) - I reviewed patient records, labs, notes, testing and imaging myself where available.  Lab Results  Component Value Date   WBC 7.4 09/05/2022   HGB 17.0 09/05/2022   HCT 49.9 09/05/2022   MCV 98 (H) 09/05/2022   PLT 272 09/05/2022      Component Value Date/Time   NA 140 10/23/2022 1108   NA 137 10/17/2022 1026   K 3.4 (L) 10/23/2022 1108   CL 101 10/23/2022 1108   CO2 29 10/23/2022 1108   GLUCOSE 154 (H) 10/23/2022 1108   BUN 12 10/23/2022 1108   BUN 9 10/17/2022 1026   CREATININE 0.72 10/23/2022 1108   CALCIUM 8.4 (L) 10/23/2022 1108   PROT 6.7 09/05/2022 1009   ALBUMIN 3.6 (L) 09/05/2022 1009   AST 14 09/05/2022 1009   ALT 14 09/05/2022 1009   ALKPHOS 121 09/05/2022 1009   BILITOT 1.3 (H) 09/05/2022 1009   GFRNONAA >60 10/23/2022 1108   No results found for: "CHOL", "HDL", "LDLCALC", "LDLDIRECT", "TRIG", "CHOLHDL" Lab Results  Component Value Date   HGBA1C 4.8 10/17/2022   Hgb A1c MFr Bld  Date Value Ref Range Status  10/17/2022 4.8 4.8 - 5.6 % Final    Comment:             Prediabetes: 5.7 - 6.4          Diabetes: >6.4          Glycemic control for adults with diabetes: <7.0   07/08/2022 8.7 (H) 4.8 - 5.6 % Final    Comment:    (NOTE)         Prediabetes: 5.7 - 6.4         Diabetes: >6.4         Glycemic control  for adults with diabetes: <7.0    Lab Results  Component Value Date   VITAMINB12 894 10/17/2022   Lab Results  Component Value Date   TSH 1.610 10/17/2022     ASSESSMENT AND PLAN  62 y.o. year old male here  with:   Dx:  1. Diabetic polyneuropathy associated with type 2 diabetes mellitus (HCC)     PLAN:  NUMBNESS, PAIN, SWELLING IN FEET (since ~2023; could be related to diabetic neuropathy, decreased activity; also chronic low back pain and chronic hip pain) - continue diabetes control (per PCP) - continue gabapentin 300mg  at bedtime; may increase up to 300-600mg  three times a day  - Other painful neuropathy treatment options: -duloxetine 30-60mg  daily, amitriptyline 25-50mg  at bedtime, pregabalin 75-150mg  twice a day - consider capsaicin cream, lidocaine patch / cream, alpha-lipoic acid 600mg  daily  HYPERTENSION! - patient is out of amlodipine (previously rx'd by [place]) - follow up with PCP asap  Orders Placed This Encounter  Procedures   Hemoglobin A1c   Meds ordered this encounter  Medications   gabapentin (NEURONTIN) 300 MG capsule    Sig: Take 1-2 capsules (300-600 mg total) by mouth 3 (three) times daily.    Dispense:  90 capsule    Refill:  3   Return for return to PCP.    Suanne Marker, MD 02/20/2023, 9:48 AM Certified in Neurology, Neurophysiology and Neuroimaging  [facility] [address] [place], [place] [address] [phone]

## 2023-03-21 ENCOUNTER — Other Ambulatory Visit: Payer: Self-pay

## 2023-03-21 NOTE — Patient Outreach (Signed)
  Care Management     03/21/2023 Name: Coy Bemiller MRN: [id number] DOB: 28-Oct-1960  Subjective: Nhut Naqvi is a 62 y.o. year old male who is a primary care patient of Simmons-Robinson, Tawanna Cooler, MD. The Care Management team was consulted for assistance.      An unsuccessful outreach was attempted today.  PLAN:  A HIPAA compliant phone message was left for the patient providing contact information and requesting a return call.   [facility]Care Management [phone]

## 2023-05-13 ENCOUNTER — Other Ambulatory Visit: Payer: Self-pay | Admitting: Urology

## 2023-06-12 ENCOUNTER — Other Ambulatory Visit: Payer: Self-pay | Admitting: Family Medicine

## 2023-06-12 ENCOUNTER — Other Ambulatory Visit: Payer: Self-pay | Admitting: Physician Assistant

## 2023-06-12 DIAGNOSIS — B9681 Helicobacter pylori [H. pylori] as the cause of diseases classified elsewhere: Secondary | ICD-10-CM

## 2023-06-12 MED ORDER — PANTOPRAZOLE SODIUM 20 MG PO TBEC: 20.0000 mg | DELAYED_RELEASE_TABLET | Freq: Two times a day (BID) | ORAL | 3 refills | Status: DC

## 2023-06-12 NOTE — Telephone Encounter (Signed)
Please advise 

## 2023-06-19 ENCOUNTER — Other Ambulatory Visit: Payer: Self-pay | Admitting: Physician Assistant

## 2023-06-19 ENCOUNTER — Ambulatory Visit: Payer: Medicare Other | Admitting: Emergency Medicine

## 2023-06-19 VITALS — Ht 66.0 in | Wt 261.0 lb

## 2023-06-19 DIAGNOSIS — E1159 Type 2 diabetes mellitus with other circulatory complications: Secondary | ICD-10-CM

## 2023-06-19 DIAGNOSIS — Z0001 Encounter for general adult medical examination with abnormal findings: Secondary | ICD-10-CM

## 2023-06-19 DIAGNOSIS — Z794 Long term (current) use of insulin: Secondary | ICD-10-CM

## 2023-06-19 DIAGNOSIS — E119 Type 2 diabetes mellitus without complications: Secondary | ICD-10-CM | POA: Diagnosis not present

## 2023-06-19 DIAGNOSIS — Z0000 Encounter for general adult medical examination without abnormal findings: Secondary | ICD-10-CM

## 2023-06-19 NOTE — Progress Notes (Signed)
Subjective:   Corey Petersen is a 62 y.o. male who presents for an Initial Medicare Annual Wellness Visit.  Visit Complete: Virtual I connected with  Trellis Paganini on 06/19/23 by a audio enabled telemedicine application and verified that I am speaking with the correct person using two identifiers.  Patient Location: Home  Provider Location: Home Office  I discussed the limitations of evaluation and management by telemedicine. The patient expressed understanding and agreed to proceed.  Vital Signs: Because this visit was a virtual/telehealth visit, some criteria may be missing or patient reported. Any vitals not documented were not able to be obtained and vitals that have been documented are patient reported.   Cardiac Risk Factors include: advanced age (>54men, >56 women);male gender;diabetes mellitus;dyslipidemia;hypertension;obesity (BMI >30kg/m2);Other (see comment), Risk factor comments: OSA (no cpap)     Objective:    Today's Vitals   06/19/23 0816  Weight: 261 lb (118.4 kg)  Height: 5\' 6"  (1.676 m)   Body mass index is 42.13 kg/m.     06/19/2023    8:34 AM 07/09/2022    5:39 PM 07/09/2022    5:08 PM 07/08/2022   11:36 AM 03/09/2022    8:30 AM 01/27/2022    1:12 PM 11/10/2021    4:32 PM  Advanced Directives  Does Patient Have a Medical Advance Directive? No  No No No No No  Would patient like information on creating a medical advance directive? Yes (MAU/Ambulatory/Procedural Areas - Information given) No - Patient declined    No - Patient declined No - Patient declined    Current Medications (verified) Outpatient Encounter Medications as of 06/19/2023  Medication Sig   amLODipine (NORVASC) 10 MG tablet Take 1 tablet (10 mg total) by mouth daily.   aspirin EC 81 MG tablet Take 1 tablet (81 mg total) by mouth daily. Swallow whole.   atorvastatin (LIPITOR) 40 MG tablet Take 1 tablet (40 mg total) by mouth daily.   Blood Glucose Monitoring Suppl (ONETOUCH VERIO) w/Device  KIT Use to check blood sugar before meals and fasting, as as needed   calcium carbonate (TUMS - DOSED IN MG ELEMENTAL CALCIUM) 500 MG chewable tablet Chew 1 tablet (200 mg of elemental calcium total) by mouth 3 (three) times daily with meals. (Patient taking differently: Chew 200 mg of elemental calcium by mouth 3 (three) times daily with meals. As needed)   CORLANOR 5 MG TABS tablet Take by mouth.   gabapentin (NEURONTIN) 300 MG capsule Take 1-2 capsules (300-600 mg total) by mouth 3 (three) times daily.   glipiZIDE (GLUCOTROL) 10 MG tablet Take 1 tablet (10 mg total) by mouth 2 (two) times daily before a meal.   glucose blood test strip Use as instructed   insulin glargine (LANTUS SOLOSTAR) 100 UNIT/ML Solostar Pen Inject 75 Units into the skin at bedtime.   Insulin Pen Needle 29G X 12.7MM MISC 1 Needle by Does not apply route at bedtime.   losartan (COZAAR) 100 MG tablet Take 1 tablet (100 mg total) by mouth daily.   metFORMIN (GLUCOPHAGE-XR) 500 MG 24 hr tablet TAKE TWO TABLETS BY MOUTH TWO TIMES A DAY FOR DIABETES NOTE DOSE INCREASE   pantoprazole (PROTONIX) 20 MG tablet Take 1 tablet (20 mg total) by mouth daily.   tadalafil (CIALIS) 20 MG tablet TAKE 1 TABLET BY MOUTH EVERY DAY AS NEEDED FOR ERECTILE DYSFUNCTION   empagliflozin (JARDIANCE) 25 MG TABS tablet Take 1 tablet (25 mg total) by mouth daily. (Patient not taking: Reported on 06/19/2023)  metoprolol tartrate (LOPRESSOR) 100 MG tablet Take 1 tablet (100 mg total) by mouth once for 1 dose. TWO HOURS PRIOR TO CARDIAC CT   No facility-administered encounter medications on file as of 06/19/2023.    Allergies (verified) Ace inhibitors   History: Past Medical History:  Diagnosis Date   Diabetes mellitus without complication (HCC)    DKA, type 2 (HCC) 07/09/2022   Hypertension    Past Surgical History:  Procedure Laterality Date   COLONOSCOPY N/A 03/09/2022   Procedure: COLONOSCOPY;  Surgeon: Midge Minium, MD;  Location: [facility]  ENDOSCOPY;  Service: Endoscopy;  Laterality: N/A;   ESOPHAGOGASTRODUODENOSCOPY N/A 03/09/2022   Procedure: ESOPHAGOGASTRODUODENOSCOPY (EGD);  Surgeon: Midge Minium, MD;  Location: [facility] ENDOSCOPY;  Service: Endoscopy;  Laterality: N/A;   HERNIA REPAIR     Family History  Problem Relation Age of Onset   Diabetes Mother    Hypertension Mother    Seizures Mother    Hypertension Father    Colon cancer Father    Social History   Socioeconomic History   Marital status: Married    Spouse name: Priscella   Number of children: 2   Years of education: Not on file   Highest education level: Not on file  Occupational History   Occupation: disability  Tobacco Use   Smoking status: Never   Smokeless tobacco: Never  Vaping Use   Vaping status: Never Used  Substance and Sexual Activity   Alcohol use: Never   Drug use: Never   Sexual activity: Not Currently  Other Topics Concern   Not on file  Social History Narrative   Not on file   Social Determinants of Health   Financial Resource Strain: Low Risk  (06/19/2023)   Overall Financial Resource Strain (CARDIA)    Difficulty of Paying Living Expenses: Not very hard  Food Insecurity: No Food Insecurity (06/19/2023)   Hunger Vital Sign    Worried About Running Out of Food in the Last Year: Never true    Ran Out of Food in the Last Year: Never true  Transportation Needs: No Transportation Needs (06/19/2023)   PRAPARE - Administrator, Civil Service (Medical): No    Lack of Transportation (Non-Medical): No  Physical Activity: Inactive (06/19/2023)   Exercise Vital Sign    Days of Exercise per Week: 0 days    Minutes of Exercise per Session: 0 min  Stress: No Stress Concern Present (06/19/2023)   [employer] of Occupational Health - Occupational Stress Questionnaire    Feeling of Stress : Not at all  Social Connections: Moderately Integrated (06/19/2023)   Social Connection and Isolation Panel [NHANES]    Frequency of  Communication with Friends and Family: Never    Frequency of Social Gatherings with Friends and Family: More than three times a week    Attends Religious Services: More than 4 times per year    Active Member of [employer] or Organizations: No    Attends Engineer, structural: Never    Marital Status: Married    Tobacco Counseling Counseling given: Not Answered   Clinical Intake:  Pre-visit preparation completed: Yes  Pain : No/denies pain     BMI - recorded: 42.13 Nutritional Status: BMI > 30  Obese Nutritional Risks: Nausea/ vomitting/ diarrhea Diabetes: Yes CBG done?: No (FBS 250 per patient) Did pt. bring in CBG monitor from home?: No  How often do you need to have someone help you when you read instructions, pamphlets, or other written materials  from your doctor or pharmacy?: 1 - Never  Interpreter Needed?: No  Information entered by :: Tora Kindred, CMA   Activities of Daily Living    06/19/2023    8:23 AM 10/17/2022    9:33 AM  In your present state of health, do you have any difficulty performing the following activities:  Hearing? 0 0  Vision? 0 1  Difficulty concentrating or making decisions? 0 0  Walking or climbing stairs? 1 1  Comment uses a cane   Dressing or bathing? 0 0  Doing errands, shopping? 1 1  Comment sometimes, wife helps   Preparing Food and eating ? N   Using the Toilet? N   In the past six months, have you accidently leaked urine? N   Do you have problems with loss of bowel control? Y   Managing your Medications? Y   Comment wife helps   Managing your Finances? Y   Comment wife helps   Housekeeping or managing your Housekeeping? Y   Comment wife helps     Patient Care Team: Ronnald Ramp, MD as PCP - General (Family Medicine) Juanell Fairly, RN as Case Manager Debbe Odea, MD as Consulting Physician (Cardiology)  Indicate any recent Medical Services you may have received from other than [facility] providers in  the past year (date may be approximate).     Assessment:   This is a routine wellness examination for [place].  Hearing/Vision screen Hearing Screening - Comments:: Denies hearing loss Vision Screening - Comments:: Not getting eye exams   Goals Addressed               This Visit's Progress     DIET - INCREASE WATER INTAKE (pt-stated)        Depression Screen    06/19/2023    8:32 AM 10/17/2022    9:32 AM 09/12/2022    2:34 PM 09/05/2022    8:43 AM  PHQ 2/9 Scores  PHQ - 2 Score 0 2 0 0  PHQ- 9 Score 0 6  7    Fall Risk    06/19/2023    8:35 AM 10/17/2022    9:32 AM 09/12/2022    2:39 PM  Fall Risk   Falls in the past year? 1 1 1   Number falls in past yr: 1 0 1  Injury with Fall? 0 0 0  Risk for fall due to : Impaired balance/gait;Impaired mobility;History of fall(s)  History of fall(s);Medication side effect;Impaired balance/gait  Follow up Falls prevention discussed;Education provided;Falls evaluation completed  Falls prevention discussed    MEDICARE RISK AT HOME: Medicare Risk at Home Any stairs in or around the home?: Yes If so, are there any without handrails?: No Home free of loose throw rugs in walkways, pet beds, electrical cords, etc?: Yes Adequate lighting in your home to reduce risk of falls?: Yes Life alert?: No Use of a cane, walker or w/c?: Yes (cane) Grab bars in the bathroom?: No Shower chair or bench in shower?: No Elevated toilet seat or a handicapped toilet?: No  TIMED UP AND GO:  Was the test performed? No    Cognitive Function:        06/19/2023    8:36 AM  6CIT Screen  What Year? 0 points  What month? 0 points  What time? 0 points  Count back from 20 0 points  Months in reverse 0 points  Repeat phrase 0 points  Total Score 0 points    Immunizations Immunization History  Administered Date(s) Administered   Hepatitis B, ADULT 09/02/2017, 10/24/2017, 01/31/2018   Influenza,inj,Quad PF,6+ Mos 05/01/2022   PFIZER  Comirnaty(Gray Top)Covid-19 Tri-Sucrose Vaccine 11/16/2020   PFIZER(Purple Top)SARS-COV-2 Vaccination 02/28/2020, 03/20/2020   Tdap 08/08/2021   Zoster Recombinant(Shingrix) 08/08/2021    TDAP status: Up to date  Flu Vaccine status: Declined, Education has been provided regarding the importance of this vaccine but patient still declined. Advised may receive this vaccine at local pharmacy or Health Dept. Aware to provide a copy of the vaccination record if obtained from local pharmacy or Health Dept. Verbalized acceptance and understanding.  Pneumococcal vaccine status: Up to date  Covid-19 vaccine status: Information provided on how to obtain vaccines.   Qualifies for Shingles Vaccine? Yes   Zostavax completed No   Shingrix Completed?: No.    Education has been provided regarding the importance of this vaccine. Patient has been advised to call insurance company to determine out of pocket expense if they have not yet received this vaccine. Advised may also receive vaccine at local pharmacy or Health Dept. Verbalized acceptance and understanding.  Screening Tests Health Maintenance  Topic Date Due   FOOT EXAM  Never done   OPHTHALMOLOGY EXAM  Never done   Hepatitis C Screening  Never done   Zoster Vaccines- Shingrix (2 of 2) 10/03/2021   INFLUENZA VACCINE  03/07/2023   COVID-19 Vaccine (4 - 2023-24 season) 04/07/2023   HEMOGLOBIN A1C  04/19/2023   Diabetic kidney evaluation - Urine ACR  09/06/2023   Diabetic kidney evaluation - eGFR measurement  10/23/2023   Medicare Annual Wellness (AWV)  06/18/2024   Colonoscopy  03/10/2027   DTaP/Tdap/Td (2 - Td or Tdap) 08/09/2031   HIV Screening  Completed   HPV VACCINES  Aged Out    Health Maintenance  Health Maintenance Due  Topic Date Due   FOOT EXAM  Never done   OPHTHALMOLOGY EXAM  Never done   Hepatitis C Screening  Never done   Zoster Vaccines- Shingrix (2 of 2) 10/03/2021   INFLUENZA VACCINE  03/07/2023   COVID-19 Vaccine (4 -  2023-24 season) 04/07/2023   HEMOGLOBIN A1C  04/19/2023    Colorectal cancer screening: Type of screening: Colonoscopy. Completed 03/09/22. Repeat every 5 years  Lung Cancer Screening: (Low Dose CT Chest recommended if Age 19-80 years, 20 pack-year currently smoking OR have quit w/in 15years.) does not qualify.   Lung Cancer Screening Referral: n/a  Additional Screening:  Hepatitis C Screening: does qualify; Completed in [place] per patient  Vision Screening: Recommended annual ophthalmology exams for early detection of glaucoma and other disorders of the eye. Is the patient up to date with their annual eye exam?  No  Who is the provider or what is the name of the office in which the patient attends annual eye exams? Patient will set up at the [facility] or local. Declined referral If pt is not established with a provider, would they like to be referred to a provider to establish care? No .   Dental Screening: Recommended annual dental exams for proper oral hygiene  Diabetic Foot Exam: Diabetic Foot Exam: Completed 11/21/22 Podiatry, Dr. Ernestene Kiel  Community Resource Referral / Chronic Care Management: CRR required this visit?  No   CCM required this visit?  No    Plan:     I have personally reviewed and noted the following in the patient's chart:   Medical and social history Use of alcohol, tobacco or illicit drugs  Current medications and  supplements including opioid prescriptions. Patient is not currently taking opioid prescriptions. Functional ability and status Nutritional status Physical activity Advanced directives List of other physicians Hospitalizations, surgeries, and ER visits in previous 12 months Vitals Screenings to include cognitive, depression, and falls Referrals and appointments  In addition, I have reviewed and discussed with patient certain preventive protocols, quality metrics, and best practice recommendations. A written personalized care plan for  preventive services as well as general preventive health recommendations were provided to patient.     Tora Kindred, CMA   06/19/2023   After Visit Summary: (MyChart) Due to this being a telephonic visit, the after visit summary with patients personalized plan was offered to patient via MyChart   Nurse Notes:  Placed referral to DM & Nutrition education Patient needs DM eye exam. He declined referral. States he will go to the [place] or see someone locally. Declined flu vaccine States he maybe will get Covid booster Scheduled OV for 07/01/23 @ 10:20 for f/u. Patient last seen 10/17/22

## 2023-06-19 NOTE — Patient Instructions (Addendum)
Corey Petersen , Thank you for taking time to come for your Medicare Wellness Visit. I appreciate your ongoing commitment to your health goals. Please review the following plan we discussed and let me know if I can assist you in the future.   Referrals/Orders/Follow-Ups/Clinician Recommendations:  I have placed a referral to Diabetes & Nutrition education. Someone should call you for an appointment. I scheduled you an OV with Dr. Neita Garnet for 07/01/23 @ 10:20am. Arrive 15 minutes early. Schedule a diabetic eye exam as soon as possible. You should be getting those yearly.  This is a list of the screening recommended for you and due dates:  Health Maintenance  Topic Date Due   Complete foot exam   Never done   Eye exam for diabetics  Never done   Hepatitis C Screening  Never done   Zoster (Shingles) Vaccine (2 of 2) 10/03/2021   COVID-19 Vaccine (4 - 2023-24 season) 04/07/2023   Hemoglobin A1C  04/19/2023   Flu Shot  11/04/2023*   Yearly kidney health urinalysis for diabetes  09/06/2023   Yearly kidney function blood test for diabetes  10/23/2023   Medicare Annual Wellness Visit  06/18/2024   Colon Cancer Screening  03/10/2027   DTaP/Tdap/Td vaccine (2 - Td or Tdap) 08/09/2031   HIV Screening  Completed   HPV Vaccine  Aged Out  *Topic was postponed. The date shown is not the original due date.    Advanced directives: (ACP Link)Information on Advanced Care Planning can be found at [facility] Advance Health Care Directives Advance Health Care Directives ([email or web address]   Once you have completed the forms, please bring a copy of your health care power of attorney and living will to the office to be added to your chart at your convenience.   Next Medicare Annual Wellness Visit scheduled for next year: Yes, 06/24/24 @ 8:10am    Fall Prevention in the Home, Adult Falls can cause injuries and affect people of all ages. There are many simple things that you can do  to make your home safe and to help prevent falls. If you need it, ask for help making these changes. What actions can I take to prevent falls? General information Use good lighting in all rooms. Make sure to: Replace any light bulbs that burn out. Turn on lights if it is dark and use night-lights. Keep items that you use often in easy-to-reach places. Lower the shelves around your home if needed. Move furniture so that there are clear paths around it. Do not keep throw rugs or other things on the floor that can make you trip. If any of your floors are uneven, fix them. Add color or contrast paint or tape to clearly mark and help you see: Grab bars or handrails. First and last steps of staircases. Where the edge of each step is. If you use a ladder or stepladder: Make sure that it is fully opened. Do not climb a closed ladder. Make sure the sides of the ladder are locked in place. Have someone hold the ladder while you use it. Know where your pets are as you move through your home. What can I do in the bathroom?     Keep the floor dry. Clean up any water that is on the floor right away. Remove soap buildup in the bathtub or shower. Buildup makes bathtubs and showers slippery. Use non-skid mats or decals on the floor of the bathtub or shower. Attach bath mats securely with  double-sided, non-slip rug tape. If you need to sit down while you are in the shower, use a non-slip stool. Install grab bars by the toilet and in the bathtub and shower. Do not use towel bars as grab bars. What can I do in the bedroom? Make sure that you have a light by your bed that is easy to reach. Do not use any sheets or blankets on your bed that hang to the floor. Have a firm bench or chair with side arms that you can use for support when you get dressed. What can I do in the kitchen? Clean up any spills right away. If you need to reach something above you, use a sturdy step stool that has a grab bar. Keep  electrical cables out of the way. Do not use floor polish or wax that makes floors slippery. What can I do with my stairs? Do not leave anything on the stairs. Make sure that you have a light switch at the top and the bottom of the stairs. Have them installed if you do not have them. Make sure that there are handrails on both sides of the stairs. Fix handrails that are broken or loose. Make sure that handrails are as long as the staircases. Install non-slip stair treads on all stairs in your home if they do not have carpet. Avoid having throw rugs at the top or bottom of stairs, or secure the rugs with carpet tape to prevent them from moving. Choose a carpet design that does not hide the edge of steps on the stairs. Make sure that carpet is firmly attached to the stairs. Fix any carpet that is loose or worn. What can I do on the outside of my home? Use bright outdoor lighting. Repair the edges of walkways and driveways and fix any cracks. Clear paths of anything that can make you trip, such as tools or rocks. Add color or contrast paint or tape to clearly mark and help you see high doorway thresholds. Trim any bushes or trees on the main path into your home. Check that handrails are securely fastened and in good repair. Both sides of all steps should have handrails. Install guardrails along the edges of any raised decks or porches. Have leaves, snow, and ice cleared regularly. Use sand, salt, or ice melt on walkways during winter months if you live where there is ice and snow. In the garage, clean up any spills right away, including grease or oil spills. What other actions can I take? Review your medicines with your health care provider. Some medicines can make you confused or feel dizzy. This can increase your chance of falling. Wear closed-toe shoes that fit well and support your feet. Wear shoes that have rubber soles and low heels. Use a cane, walker, scooter, or crutches that help you move  around if needed. Talk with your provider about other ways that you can decrease your risk of falls. This may include seeing a physical therapist to learn to do exercises to improve movement and strength. Where to find more information Centers for Disease Control and Prevention, STEADI: TonerPromos.no [employer] on Aging: BaseRingTones.pl National Institute on Aging: BaseRingTones.pl Contact a health care provider if: You are afraid of falling at home. You feel weak, drowsy, or dizzy at home. You fall at home. Get help right away if you: Lose consciousness or have trouble moving after a fall. Have a fall that causes a head injury. These symptoms may be an emergency. Get help  right away. Call 911. Do not wait to see if the symptoms will go away. Do not drive yourself to the hospital. This information is not intended to replace advice given to you by your health care provider. Make sure you discuss any questions you have with your health care provider. Document Revised: 03/26/2022 Document Reviewed: 03/26/2022 Elsevier Patient Education  2024 [employer].

## 2023-06-20 NOTE — Telephone Encounter (Signed)
Requested Prescriptions  Pending Prescriptions Disp Refills   losartan (COZAAR) 100 MG tablet [Pharmacy Med Name: LOSARTAN POTASSIUM 100 MG TAB] 90 tablet 0    Sig: TAKE 1 TABLET BY MOUTH EVERY DAY     Cardiovascular:  Angiotensin Receptor Blockers Failed - 06/19/2023 10:34 AM      Failed - Cr in normal range and within 180 days    Creatinine, Ser  Date Value Ref Range Status  10/23/2022 0.72 0.61 - 1.24 mg/dL Final         Failed - K in normal range and within 180 days    Potassium  Date Value Ref Range Status  10/23/2022 3.4 (L) 3.5 - 5.1 mmol/L Final         Failed - Last BP in normal range    BP Readings from Last 1 Encounters:  02/20/23 (!) 184/94         Failed - Valid encounter within last 6 months    Recent Outpatient Visits           8 months ago Hypertension associated with diabetes [facility])   [place] [facility] Simmons-Robinson, Tatum, MD   9 months ago Insulin dependent type 2 diabetes mellitus [facility])   [place] [facility] Alfredia Ferguson, PA-C       Future Appointments             In 1 week Simmons-Robinson, Tawanna Cooler, MD [facility]            Passed - Patient is not pregnant

## 2023-06-30 NOTE — Progress Notes (Unsigned)
Established patient visit   Patient: Corey Petersen   DOB: 1961-04-09   62 y.o. Male  MRN: [id number] Visit Date: 07/01/2023  Today's healthcare provider: Ronnald Ramp, MD   No chief complaint on file.  Subjective       Discussed the use of AI scribe software for clinical note transcription with the patient, who gave verbal consent to proceed.  History of Present Illness             Past Medical History:  Diagnosis Date   Diabetes mellitus without complication (HCC)    DKA, type 2 (HCC) 07/09/2022   Hypertension     Medications: Outpatient Medications Prior to Visit  Medication Sig   amLODipine (NORVASC) 10 MG tablet Take 1 tablet (10 mg total) by mouth daily.   aspirin EC 81 MG tablet Take 1 tablet (81 mg total) by mouth daily. Swallow whole.   atorvastatin (LIPITOR) 40 MG tablet Take 1 tablet (40 mg total) by mouth daily.   Blood Glucose Monitoring Suppl (ONETOUCH VERIO) w/Device KIT Use to check blood sugar before meals and fasting, as as needed   calcium carbonate (TUMS - DOSED IN MG ELEMENTAL CALCIUM) 500 MG chewable tablet Chew 1 tablet (200 mg of elemental calcium total) by mouth 3 (three) times daily with meals. (Patient taking differently: Chew 200 mg of elemental calcium by mouth 3 (three) times daily with meals. As needed)   CORLANOR 5 MG TABS tablet Take by mouth.   empagliflozin (JARDIANCE) 25 MG TABS tablet Take 1 tablet (25 mg total) by mouth daily. (Patient not taking: Reported on 06/19/2023)   gabapentin (NEURONTIN) 300 MG capsule Take 1-2 capsules (300-600 mg total) by mouth 3 (three) times daily.   glipiZIDE (GLUCOTROL) 10 MG tablet Take 1 tablet (10 mg total) by mouth 2 (two) times daily before a meal.   glucose blood test strip Use as instructed   insulin glargine (LANTUS SOLOSTAR) 100 UNIT/ML Solostar Pen Inject 75 Units into the skin at bedtime.   Insulin Pen Needle 29G X 12.7MM MISC 1 Needle by Does not apply route at bedtime.    losartan (COZAAR) 100 MG tablet TAKE 1 TABLET BY MOUTH EVERY DAY   metFORMIN (GLUCOPHAGE-XR) 500 MG 24 hr tablet TAKE TWO TABLETS BY MOUTH TWO TIMES A DAY FOR DIABETES NOTE DOSE INCREASE   metoprolol tartrate (LOPRESSOR) 100 MG tablet Take 1 tablet (100 mg total) by mouth once for 1 dose. TWO HOURS PRIOR TO CARDIAC CT   pantoprazole (PROTONIX) 20 MG tablet Take 1 tablet (20 mg total) by mouth daily.   tadalafil (CIALIS) 20 MG tablet TAKE 1 TABLET BY MOUTH EVERY DAY AS NEEDED FOR ERECTILE DYSFUNCTION   No facility-administered medications prior to visit.    Review of Systems  Last metabolic panel Lab Results  Component Value Date   GLUCOSE 154 (H) 10/23/2022   NA 140 10/23/2022   K 3.4 (L) 10/23/2022   CL 101 10/23/2022   CO2 29 10/23/2022   BUN 12 10/23/2022   CREATININE 0.72 10/23/2022   GFRNONAA >60 10/23/2022   CALCIUM 8.4 (L) 10/23/2022   PROT 6.7 09/05/2022   ALBUMIN 3.6 (L) 09/05/2022   LABGLOB 3.1 09/05/2022   AGRATIO 1.2 09/05/2022   BILITOT 1.3 (H) 09/05/2022   ALKPHOS 121 09/05/2022   AST 14 09/05/2022   ALT 14 09/05/2022   ANIONGAP 10 10/23/2022   Last lipids No results found for: "CHOL", "HDL", "LDLCALC", "LDLDIRECT", "TRIG", "CHOLHDL" Last hemoglobin A1c  Lab Results  Component Value Date   HGBA1C 4.8 10/17/2022     {See past labs  Heme  Chem  Endocrine  Serology  Results Review (optional):1}   Objective    There were no vitals taken for this visit. BP Readings from Last 3 Encounters:  02/20/23 (!) 184/94  11/19/22 (!) 167/84  11/13/22 (!) 155/106   Wt Readings from Last 3 Encounters:  06/19/23 261 lb (118.4 kg)  02/20/23 233 lb 6.4 oz (105.9 kg)  02/12/23 214 lb (97.1 kg)    {See vitals history (optional):1}    Physical Exam  ***  No results found for any visits on 07/01/23.  Assessment & Plan     Problem List Items Addressed This Visit   None   Assessment and Plan              No follow-ups on file.         Ronnald Ramp, MD  [facility] [phone] (phone) [phone] (fax)  [facility]

## 2023-07-01 ENCOUNTER — Encounter: Payer: Self-pay | Admitting: Family Medicine

## 2023-07-01 ENCOUNTER — Ambulatory Visit (INDEPENDENT_AMBULATORY_CARE_PROVIDER_SITE_OTHER): Payer: Medicare Other | Admitting: Family Medicine

## 2023-07-01 VITALS — BP 199/96 | HR 83 | Resp 18 | Ht 66.0 in | Wt 256.0 lb

## 2023-07-01 DIAGNOSIS — Z794 Long term (current) use of insulin: Secondary | ICD-10-CM | POA: Diagnosis not present

## 2023-07-01 DIAGNOSIS — I152 Hypertension secondary to endocrine disorders: Secondary | ICD-10-CM | POA: Diagnosis not present

## 2023-07-01 DIAGNOSIS — E1165 Type 2 diabetes mellitus with hyperglycemia: Secondary | ICD-10-CM | POA: Diagnosis not present

## 2023-07-01 DIAGNOSIS — E1159 Type 2 diabetes mellitus with other circulatory complications: Secondary | ICD-10-CM

## 2023-07-01 DIAGNOSIS — E1169 Type 2 diabetes mellitus with other specified complication: Secondary | ICD-10-CM | POA: Diagnosis not present

## 2023-07-01 DIAGNOSIS — G4730 Sleep apnea, unspecified: Secondary | ICD-10-CM | POA: Diagnosis not present

## 2023-07-01 DIAGNOSIS — E785 Hyperlipidemia, unspecified: Secondary | ICD-10-CM

## 2023-07-01 MED ORDER — LOSARTAN POTASSIUM 100 MG PO TABS: 100.0000 mg | ORAL_TABLET | Freq: Every day | ORAL | 2 refills | Status: AC

## 2023-07-01 NOTE — Assessment & Plan Note (Signed)
Chronic Continue atorvastatin 40 mg daily 

## 2023-07-01 NOTE — Assessment & Plan Note (Signed)
Chronic  Stable with CPAP use  Patient advised to continue CPAP nightly  The patient has had significant benefit from the use of CPAP machine with considerable improvements in quality of life, work production and decrease medical complaints.  The patient will need continued maintenance and care CPAP supplies (including upgrades as deemed appropriate) in order to continue treatment for sleep apnea as this is medically necessary.

## 2023-07-01 NOTE — Assessment & Plan Note (Signed)
Last hemoglobin A1c was within normal range.  Chronic, last A1c was well within goal range  Weight has fluctuated slightly since last visit. Monitoring A1c is crucial for diabetes control. - Check hemoglobin A1c today - Follow up in 3 months

## 2023-07-01 NOTE — Patient Instructions (Addendum)
(  [phone] [id number] - Urology to follow up on left renal cyst

## 2023-07-01 NOTE — Assessment & Plan Note (Signed)
Chronic Blood pressure is significantly elevated today. Did not take medication this morning. No associated symptoms such as headache or vision changes. Typically takes medication upon waking. Discussed the importance of adherence and home monitoring. - Continue amlodipine 10 mg daily - Continue losartan 100 mg daily - Ensure refills for losartan - Monitor blood pressure at home, record readings 3-4 times a week - Follow up in 3 months

## 2023-07-02 LAB — CMP14+EGFR
ALT: 18 [IU]/L (ref 0–44)
AST: 17 [IU]/L (ref 0–40)
Albumin: 3.7 g/dL — ABNORMAL LOW (ref 3.9–4.9)
Alkaline Phosphatase: 116 [IU]/L (ref 44–121)
BUN/Creatinine Ratio: 8 — ABNORMAL LOW (ref 10–24)
BUN: 9 mg/dL (ref 8–27)
Bilirubin Total: 1 mg/dL (ref 0.0–1.2)
CO2: 24 mmol/L (ref 20–29)
Calcium: 9.2 mg/dL (ref 8.6–10.2)
Chloride: 97 mmol/L (ref 96–106)
Creatinine, Ser: 1.13 mg/dL (ref 0.76–1.27)
Globulin, Total: 2.9 g/dL (ref 1.5–4.5)
Glucose: 203 mg/dL — ABNORMAL HIGH (ref 70–99)
Potassium: 4 mmol/L (ref 3.5–5.2)
Sodium: 138 mmol/L (ref 134–144)
Total Protein: 6.6 g/dL (ref 6.0–8.5)
eGFR: 73 mL/min/{1.73_m2} (ref >59)

## 2023-07-02 LAB — LIPID PANEL
Chol/HDL Ratio: 2.1 {ratio} (ref 0.0–5.0)
Cholesterol, Total: 158 mg/dL (ref 100–199)
HDL: 75 mg/dL (ref >39)
LDL Chol Calc (NIH): 72 mg/dL (ref 0–99)
Triglycerides: 50 mg/dL (ref 0–149)
VLDL Cholesterol Cal: 11 mg/dL (ref 5–40)

## 2023-07-02 LAB — HEMOGLOBIN A1C
Est. average glucose Bld gHb Est-mCnc: 126 mg/dL
Hgb A1c MFr Bld: 6 % — ABNORMAL HIGH (ref 4.8–5.6)

## 2023-07-11 ENCOUNTER — Telehealth: Payer: Self-pay

## 2023-07-11 NOTE — Patient Outreach (Signed)
  Care Management   Outreach Note  07/11/2023 Name: Corey Petersen MRN: [id number] DOB: [birth year]  An unsuccessful telephone outreach was attempted today to contact the patient about Care Management needs.     Follow Up Plan:  A HIPAA compliant phone message was left for the patient providing contact information and requesting a return call.    [facility]  [facility] RN Care Manager Direct Dial: [phone] Website: [email or web address]

## 2023-10-02 ENCOUNTER — Ambulatory Visit: Payer: Medicare Other | Admitting: Family Medicine

## 2023-10-02 NOTE — Progress Notes (Deleted)
 Established patient visit   Patient: Corey Petersen   DOB: 1960-09-26   63 y.o. Male  MRN: [id number] Visit Date: 10/02/2023  Today's healthcare provider: Ronnald Ramp, MD   No chief complaint on file.  Subjective       Discussed the use of AI scribe software for clinical note transcription with the patient, who gave verbal consent to proceed.  History of Present Illness             Past Medical History:  Diagnosis Date   Diabetes mellitus without complication (HCC)    DKA, type 2 (HCC) 07/09/2022   Hypertension     Medications: Outpatient Medications Prior to Visit  Medication Sig   amLODipine (NORVASC) 10 MG tablet Take 1 tablet (10 mg total) by mouth daily.   aspirin EC 81 MG tablet Take 1 tablet (81 mg total) by mouth daily. Swallow whole.   atorvastatin (LIPITOR) 40 MG tablet Take 1 tablet (40 mg total) by mouth daily.   Blood Glucose Monitoring Suppl (ONETOUCH VERIO) w/Device KIT Use to check blood sugar before meals and fasting, as as needed   calcium carbonate (TUMS - DOSED IN MG ELEMENTAL CALCIUM) 500 MG chewable tablet Chew 1 tablet (200 mg of elemental calcium total) by mouth 3 (three) times daily with meals. (Patient taking differently: Chew 200 mg of elemental calcium by mouth 3 (three) times daily with meals. As needed)   CORLANOR 5 MG TABS tablet Take by mouth.   gabapentin (NEURONTIN) 300 MG capsule Take 1-2 capsules (300-600 mg total) by mouth 3 (three) times daily.   glipiZIDE (GLUCOTROL) 10 MG tablet Take 1 tablet (10 mg total) by mouth 2 (two) times daily before a meal.   glucose blood test strip Use as instructed   insulin glargine (LANTUS SOLOSTAR) 100 UNIT/ML Solostar Pen Inject 75 Units into the skin at bedtime.   Insulin Pen Needle 29G X 12.7MM MISC 1 Needle by Does not apply route at bedtime.   losartan (COZAAR) 100 MG tablet Take 1 tablet (100 mg total) by mouth daily.   metFORMIN (GLUCOPHAGE-XR) 500 MG 24 hr tablet TAKE TWO  TABLETS BY MOUTH TWO TIMES A DAY FOR DIABETES NOTE DOSE INCREASE   pantoprazole (PROTONIX) 20 MG tablet Take 1 tablet (20 mg total) by mouth daily.   tadalafil (CIALIS) 20 MG tablet TAKE 1 TABLET BY MOUTH EVERY DAY AS NEEDED FOR ERECTILE DYSFUNCTION   No facility-administered medications prior to visit.    Review of Systems  Last metabolic panel Lab Results  Component Value Date   GLUCOSE 203 (H) 07/01/2023   NA 138 07/01/2023   K 4.0 07/01/2023   CL 97 07/01/2023   CO2 24 07/01/2023   BUN 9 07/01/2023   CREATININE 1.13 07/01/2023   EGFR 73 07/01/2023   CALCIUM 9.2 07/01/2023   PROT 6.6 07/01/2023   ALBUMIN 3.7 (L) 07/01/2023   LABGLOB 2.9 07/01/2023   AGRATIO 1.2 09/05/2022   BILITOT 1.0 07/01/2023   ALKPHOS 116 07/01/2023   AST 17 07/01/2023   ALT 18 07/01/2023   ANIONGAP 10 10/23/2022   Last lipids Lab Results  Component Value Date   CHOL 158 07/01/2023   HDL 75 07/01/2023   LDLCALC 72 07/01/2023   TRIG 50 07/01/2023   CHOLHDL 2.1 07/01/2023   Last hemoglobin A1c Lab Results  Component Value Date   HGBA1C 6.0 (H) 07/01/2023     {See past labs  Heme  Chem  Endocrine  Serology  Results Review (optional):1}   Objective    There were no vitals taken for this visit. BP Readings from Last 3 Encounters:  07/01/23 (!) 199/96  02/20/23 (!) 184/94  11/19/22 (!) 167/84   Wt Readings from Last 3 Encounters:  07/01/23 256 lb (116.1 kg)  06/19/23 261 lb (118.4 kg)  02/20/23 233 lb 6.4 oz (105.9 kg)    {See vitals history (optional):1}    Physical Exam  ***  No results found for any visits on 10/02/23.  Assessment & Plan     Problem List Items Addressed This Visit   None   Assessment and Plan              No follow-ups on file.         Ronnald Ramp, MD  [facility] [phone] (phone) [phone] (fax)  [facility]

## 2023-10-23 ENCOUNTER — Other Ambulatory Visit: Payer: Self-pay | Admitting: Family Medicine

## 2023-10-23 DIAGNOSIS — E119 Type 2 diabetes mellitus without complications: Secondary | ICD-10-CM

## 2023-10-24 NOTE — Telephone Encounter (Signed)
 Needs 3 month f/u- courtesy RF given Requested Prescriptions  Pending Prescriptions Disp Refills   LANTUS SOLOSTAR 100 UNIT/ML Solostar Pen [Pharmacy Med Name: LANTUS SOLOSTAR 100 UNIT/ML]  20    Sig: INJECT 75 UNITS INTO THE SKIN AT BEDTIME.     Endocrinology:  Diabetes - Insulins Passed - 10/24/2023  3:55 PM      Passed - HBA1C is between 0 and 7.9 and within 180 days    Hgb A1c MFr Bld  Date Value Ref Range Status  07/01/2023 6.0 (H) 4.8 - 5.6 % Final    Comment:             Prediabetes: 5.7 - 6.4          Diabetes: >6.4          Glycemic control for adults with diabetes: <7.0          Passed - Valid encounter within last 6 months    Recent Outpatient Visits           3 months ago Hypertension associated with diabetes (HCC)   [place] [facility] Simmons-Robinson, Financial controller, MD   1 year ago Hypertension associated with diabetes (HCC)   [place] [facility] Simmons-Robinson, [place], [place]   1 year ago Insulin dependent type 2 diabetes mellitus (HCC)   [place] [facility] Drubel, Lillia Abed, PA-C               atorvastatin (LIPITOR) 40 MG tablet [Pharmacy Med Name: ATORVASTATIN 40 MG TABLET] 90 tablet 3    Sig: TAKE 1 TABLET BY MOUTH EVERY DAY     Cardiovascular:  Antilipid - Statins Failed - 10/24/2023  3:55 PM      Failed - Lipid Panel in normal range within the last 12 months    Cholesterol, Total  Date Value Ref Range Status  07/01/2023 158 100 - 199 mg/dL Final   LDL Chol Calc (NIH)  Date Value Ref Range Status  07/01/2023 72 0 - 99 mg/dL Final   HDL  Date Value Ref Range Status  07/01/2023 75 >39 mg/dL Final   Triglycerides  Date Value Ref Range Status  07/01/2023 50 0 - 149 mg/dL Final         Passed - Patient is not pregnant      Passed - Valid encounter within last 12 months    Recent Outpatient Visits           3 months ago Hypertension associated with diabetes (HCC)   [place] [facility] Simmons-Robinson, [place], [place]   1 year ago Hypertension associated with diabetes [facility])   [place] [facility] Simmons-Robinson, [place], MD   1 year ago Insulin dependent type 2 diabetes mellitus [facility])   [facility] Alfredia Ferguson, [place]

## 2023-11-20 IMAGING — CT CT ABD-PELV W/ CM
2 of 5 series · 15 of 46 positions shown, 17 images · IV contrast (APPLIED)
Comparison: None.

CLINICAL DATA: Nausea vomiting.  Bowel obstruction suspected.

EXAM:
CT ABDOMEN AND PELVIS WITH CONTRAST
TECHNIQUE: Multidetector CT imaging of the abdomen and pelvis was performed
using the standard protocol following bolus administration of
intravenous contrast.

[Series 2: abdomen 5.0 · axial · 0.93mm/px · z∈[-530,-90]mm · 12 of 104 slices shown, 14 images]
[im 8/104  soft-tissue]
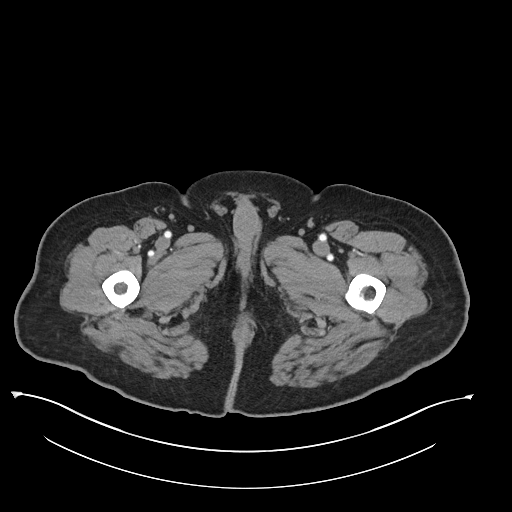
[im 8/104  bone]
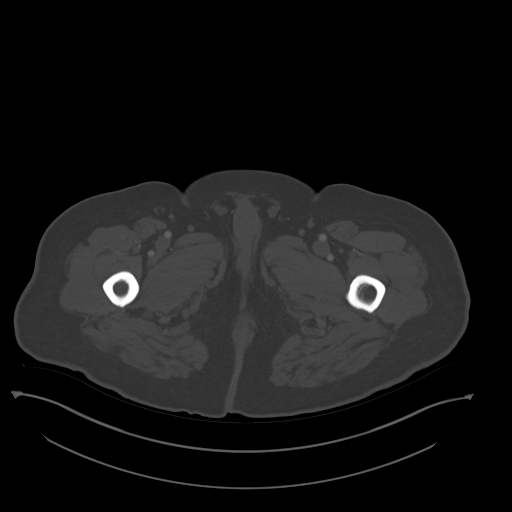
[im 16/104  soft-tissue]
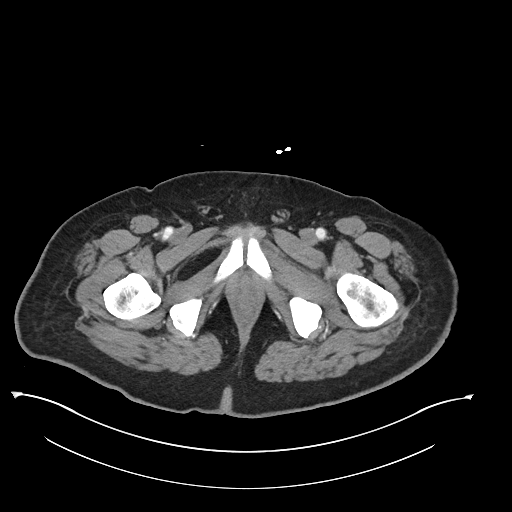
[im 24/104  soft-tissue]
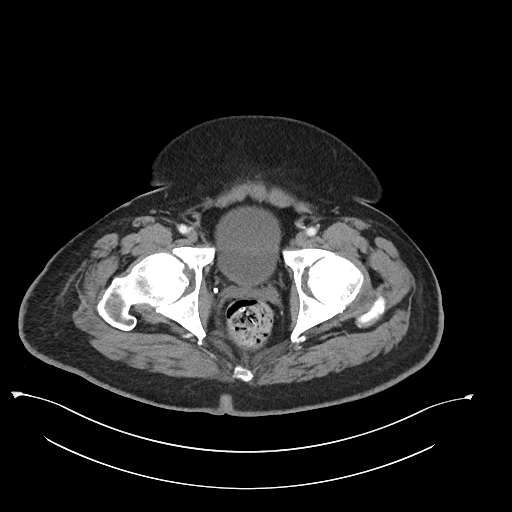
[im 32/104  soft-tissue]
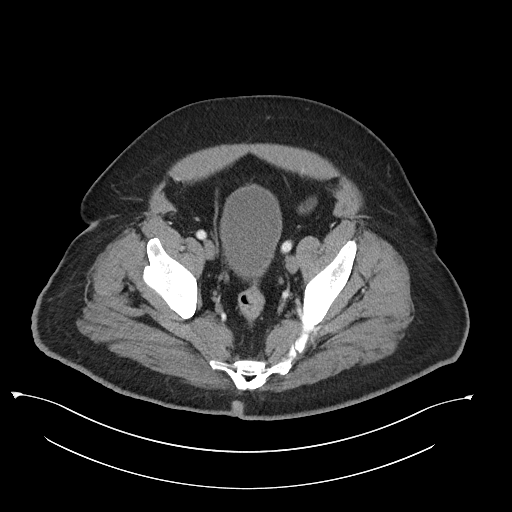
[im 40/104  soft-tissue]
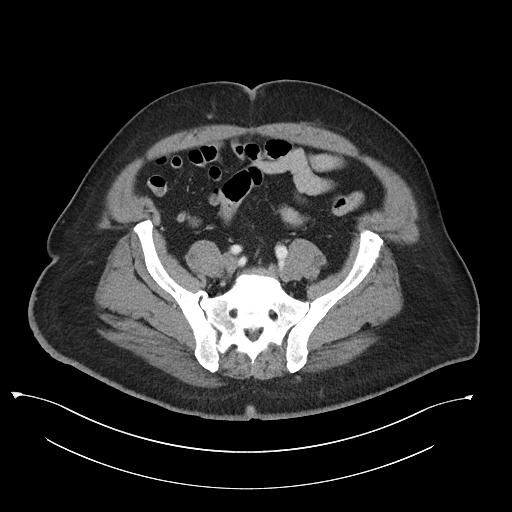
[im 48/104  soft-tissue]
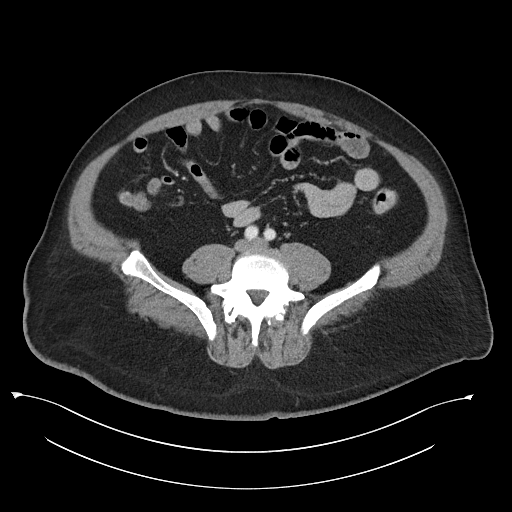
[im 56/104  soft-tissue]
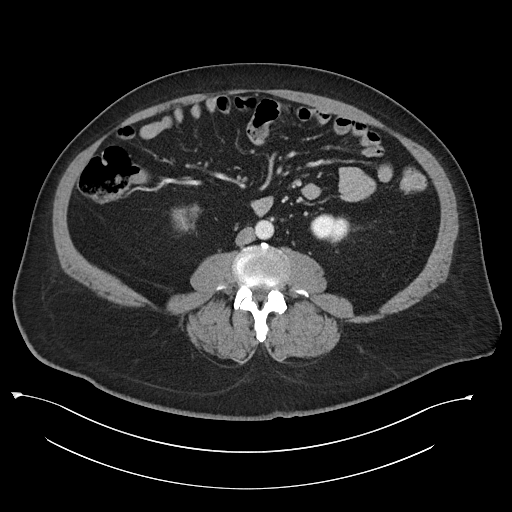
[im 64/104  soft-tissue]
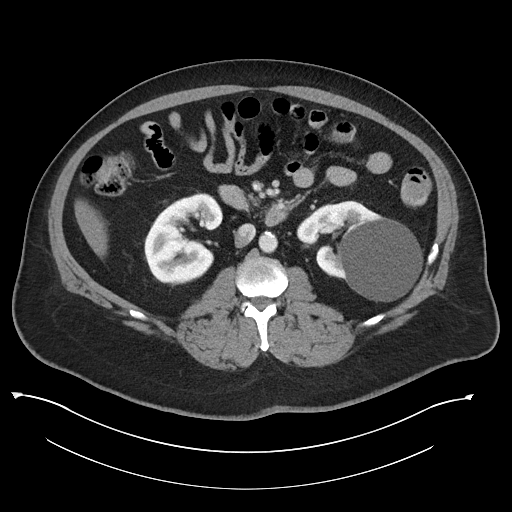
[im 72/104  soft-tissue]
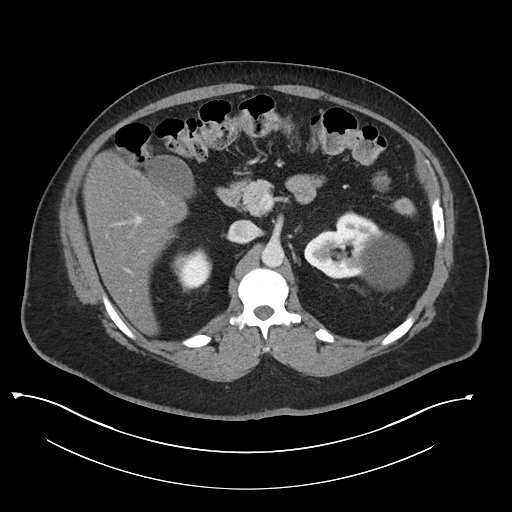
[im 72/104  bone]
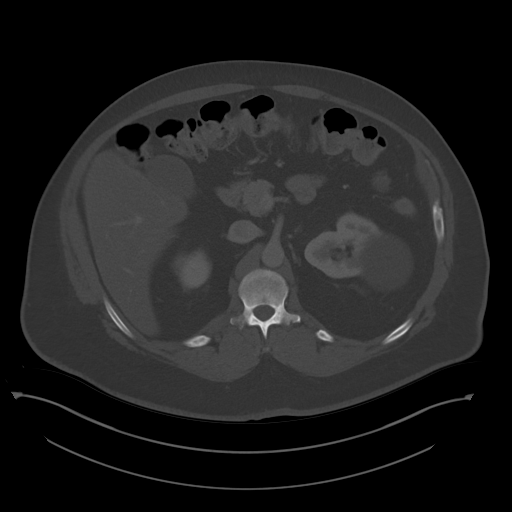
[im 80/104  soft-tissue]
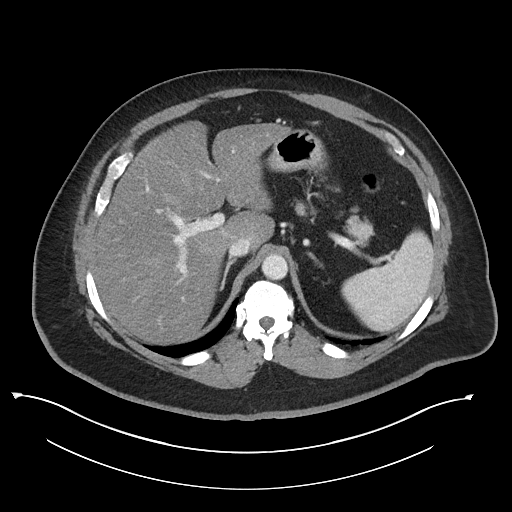
[im 88/104  soft-tissue]
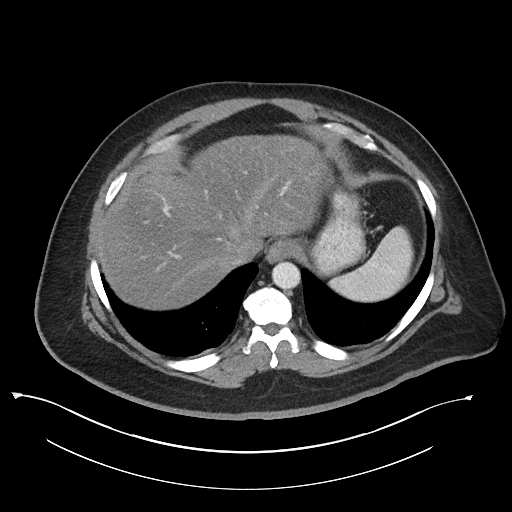
[im 96/104  soft-tissue]
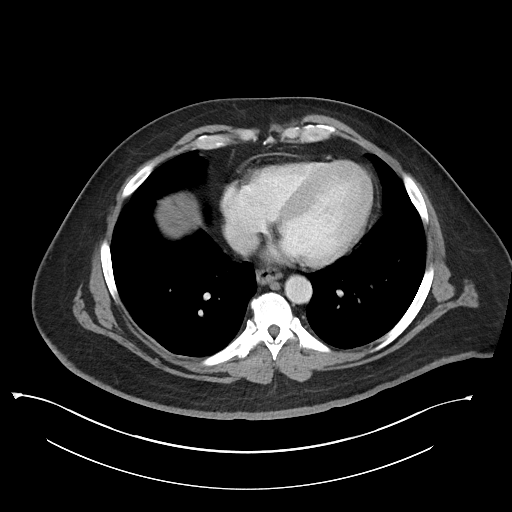

[Series 5: abdomen 3.0 mpr cor · coronal · 0.91mm/px · 3 of 114 slices shown]
[im 38/114  soft-tissue]
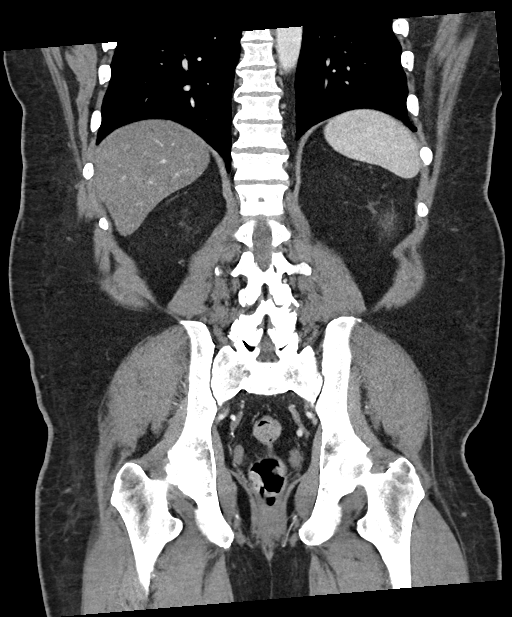
[im 51/114  soft-tissue]
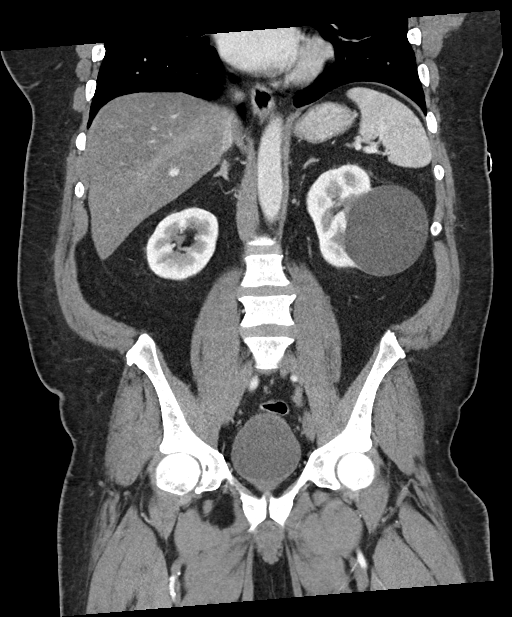
[im 63/114  soft-tissue]
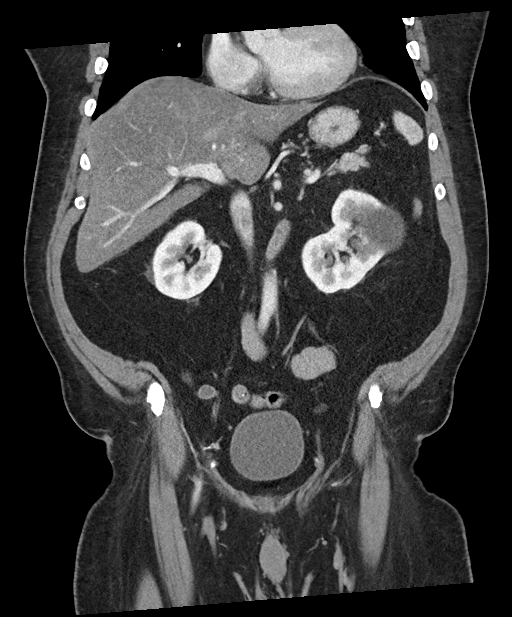

[15 of 46 positions shown; findings below may reference images not displayed]

RADIATION DOSE REDUCTION: This exam was performed according to the
departmental dose-optimization program which includes automated
exposure control, adjustment of the mA and/or kV according to
patient size and/or use of iterative reconstruction technique.

CONTRAST:  100mL OMNIPAQUE IOHEXOL 300 MG/ML  SOLN
FINDINGS: Lower chest: Unremarkable.

Hepatobiliary: The liver shows diffusely decreased attenuation
suggesting fat deposition. No suspicious focal abnormality within
the liver parenchyma. Calcified gallstones noted. No intrahepatic or
extrahepatic biliary dilation.

Pancreas: No focal mass lesion. No dilatation of the main duct. No
intraparenchymal cyst. No peripancreatic edema.

Spleen: No splenomegaly. No focal mass lesion.

Adrenals/Urinary Tract: No adrenal nodule or mass. Right kidney
unremarkable. 8.4 cm exophytic cyst noted interpolar left kidney. No
evidence for hydroureter. The urinary bladder appears normal for the
degree of distention.

Stomach/Bowel: Stomach is unremarkable. No gastric wall thickening.
No evidence of outlet obstruction. Duodenum is normally positioned
as is the ligament of Treitz. No small bowel wall thickening. No
small bowel dilatation. The terminal ileum is normal. The appendix
is normal. No gross colonic mass. No colonic wall thickening.

Vascular/Lymphatic: There is mild atherosclerotic calcification of
the abdominal aorta without aneurysm. There is no gastrohepatic or
hepatoduodenal ligament lymphadenopathy. No retroperitoneal or
mesenteric lymphadenopathy. No pelvic sidewall lymphadenopathy.

Reproductive: The prostate gland and seminal vesicles are
unremarkable.

Other: No intraperitoneal free fluid.

Musculoskeletal: No worrisome lytic or sclerotic osseous
abnormality. Probable bone island L2 vertebral body.
IMPRESSION: 1. No acute findings in the abdomen or pelvis. Specifically, no
evidence for bowel obstruction.
2. Cholelithiasis.
3. 8.4 cm exophytic left renal cyst.
4. Aortic Atherosclerosis ([id number]).

## 2023-12-10 ENCOUNTER — Other Ambulatory Visit: Payer: Self-pay | Admitting: Family Medicine

## 2023-12-10 DIAGNOSIS — B9681 Helicobacter pylori [H. pylori] as the cause of diseases classified elsewhere: Secondary | ICD-10-CM

## 2023-12-12 IMAGING — CT CT ABD-PELV W/ CM
2 of 5 series · 17 of 46 positions shown, 19 images · IV contrast (APPLIED)
Comparison: 10/19/2021

CLINICAL DATA: Nausea and vomiting.  Hiccups and belching.

EXAM:
CT ABDOMEN AND PELVIS WITH CONTRAST
TECHNIQUE: Multidetector CT imaging of the abdomen and pelvis was performed
using the standard protocol following bolus administration of
intravenous contrast.

[Series 2: abdomen 5.0 · axial · 0.92mm/px · z∈[-1175,-760]mm · 14 of 97 slices shown, 16 images]
[im 7/97  soft-tissue]
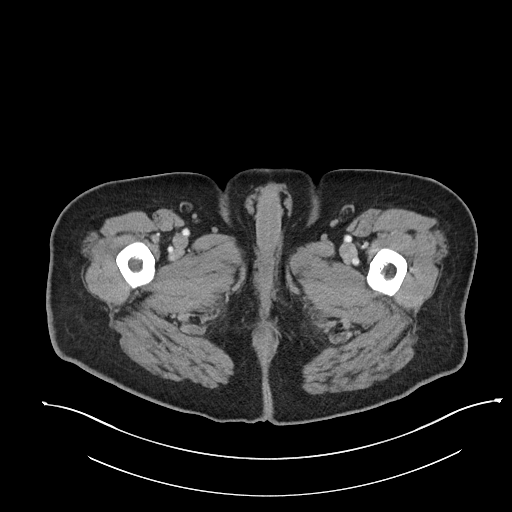
[im 7/97  bone]
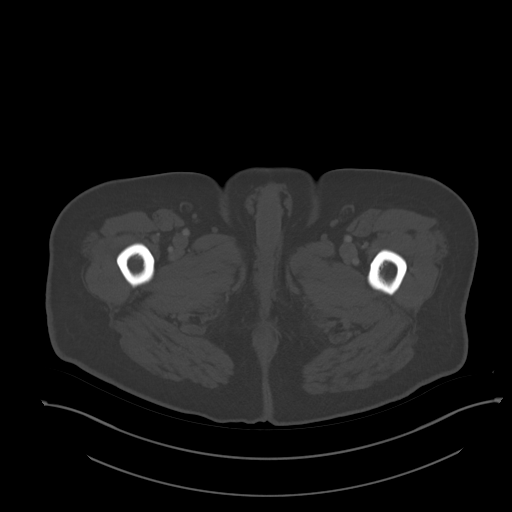
[im 13/97  soft-tissue]
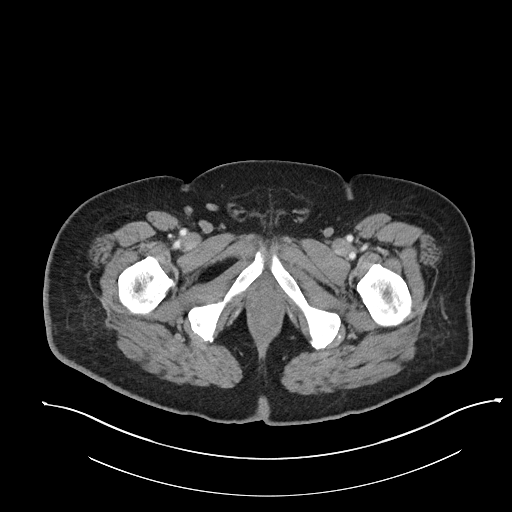
[im 20/97  soft-tissue]
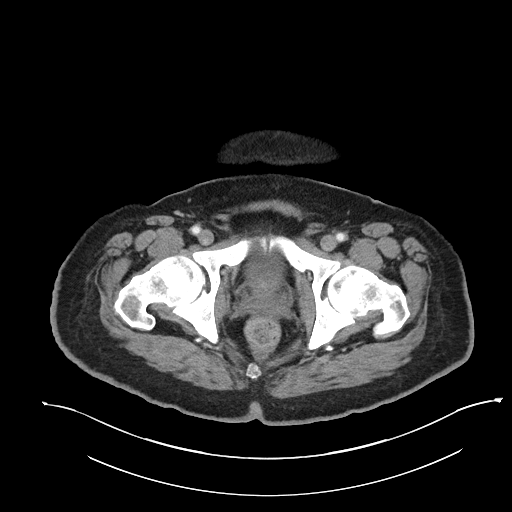
[im 26/97  soft-tissue]
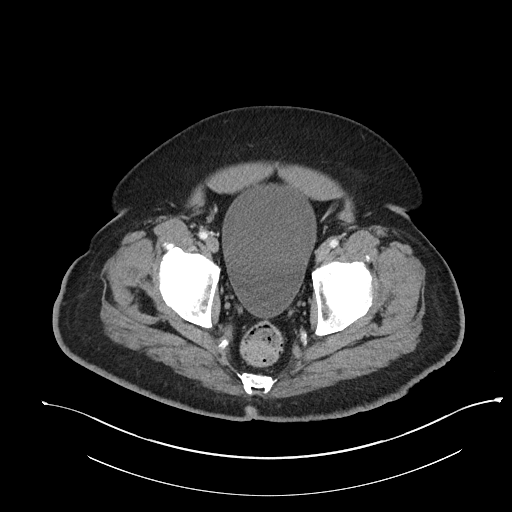
[im 33/97  soft-tissue]
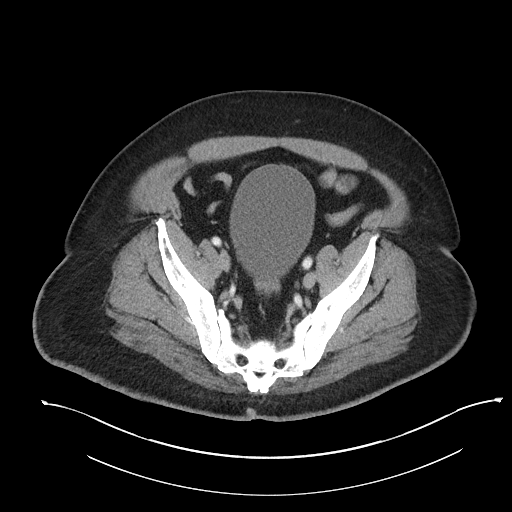
[im 39/97  soft-tissue]
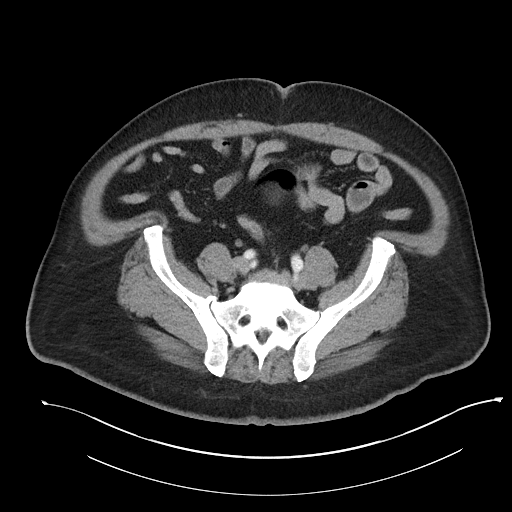
[im 45/97  soft-tissue]
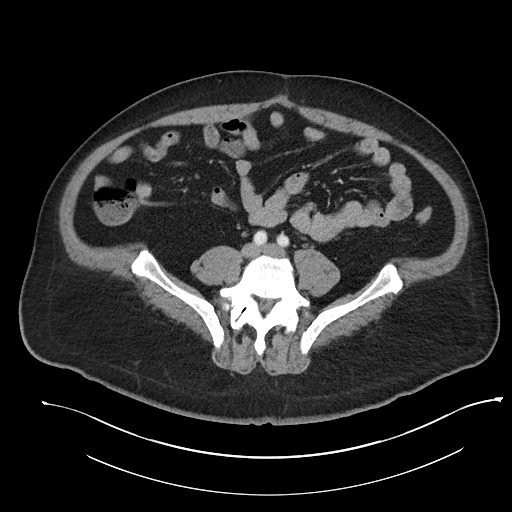
[im 52/97  soft-tissue]
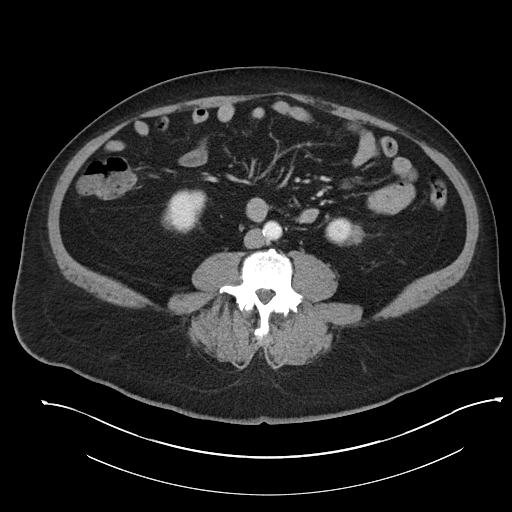
[im 58/97  soft-tissue]
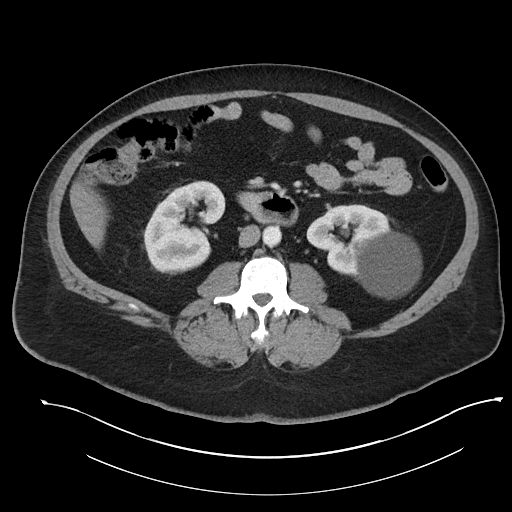
[im 58/97  bone]
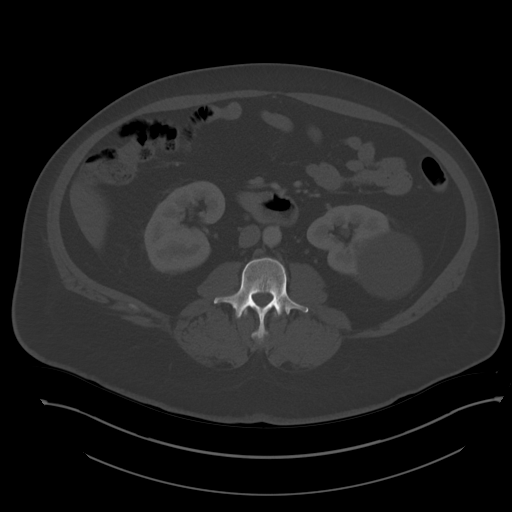
[im 65/97  soft-tissue]
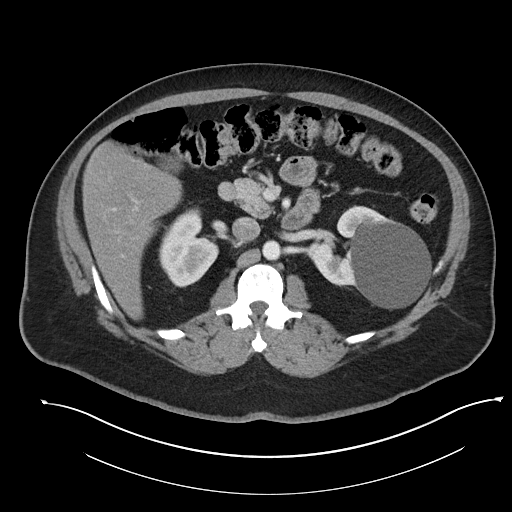
[im 71/97  soft-tissue]
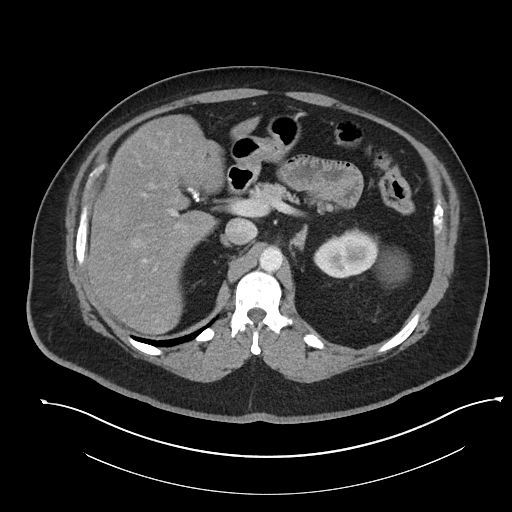
[im 77/97  soft-tissue]
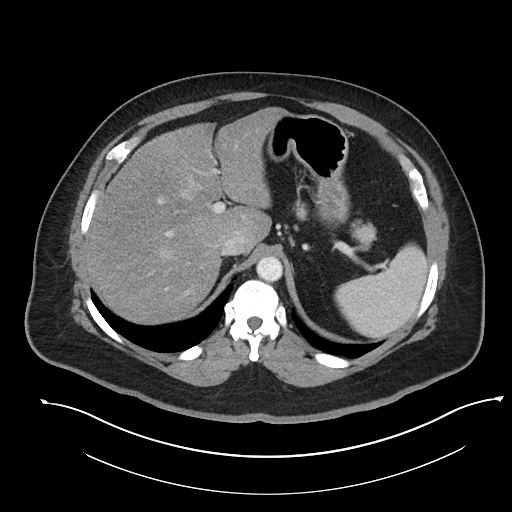
[im 84/97  soft-tissue]
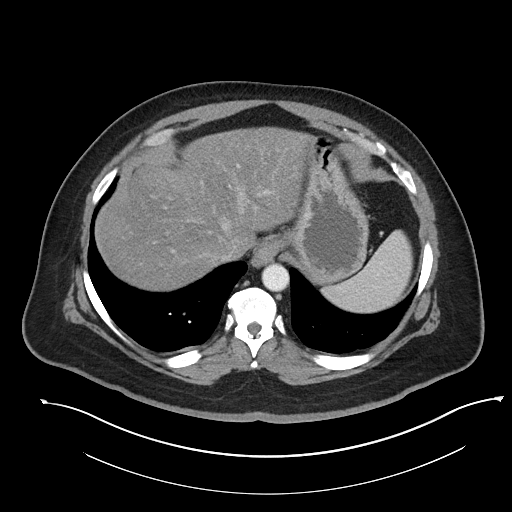
[im 90/97  soft-tissue]
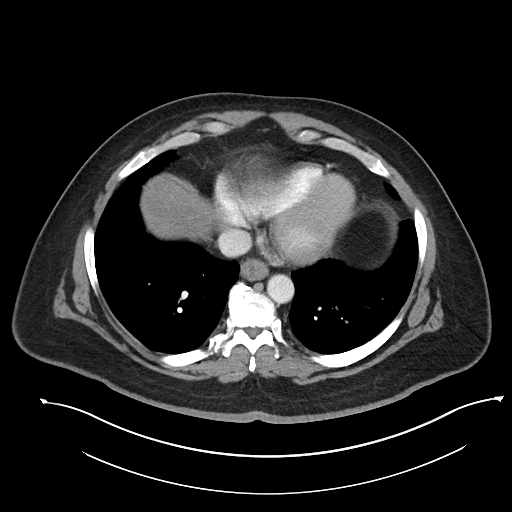

[Series 5: abdomen 3.0 mpr cor · coronal · 0.91mm/px · 3 of 110 slices shown]
[im 37/110  soft-tissue]
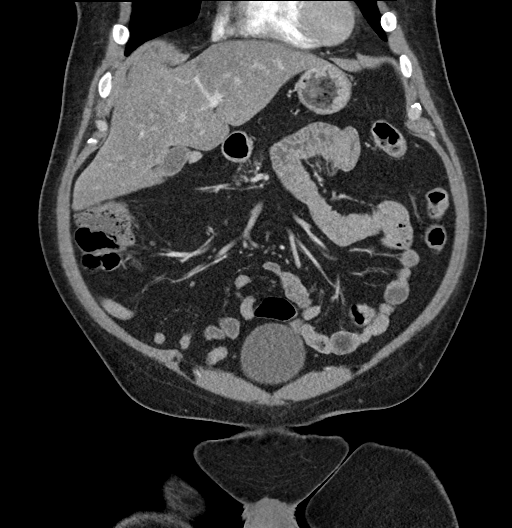
[im 49/110  soft-tissue]
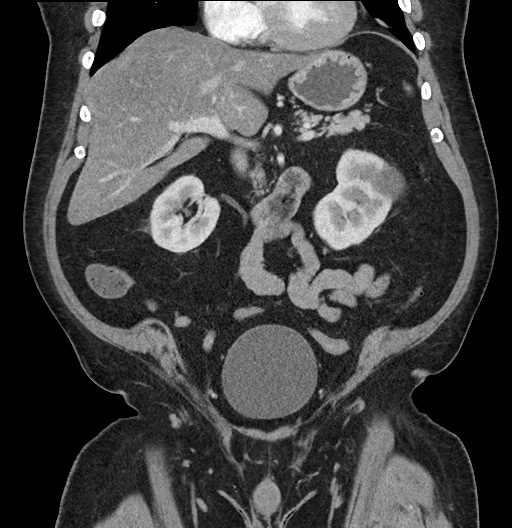
[im 61/110  soft-tissue]
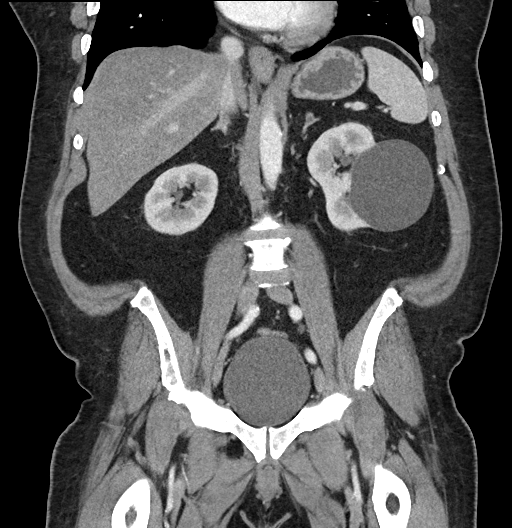

[17 of 46 positions shown; findings below may reference images not displayed]

RADIATION DOSE REDUCTION: This exam was performed according to the
departmental dose-optimization program which includes automated
exposure control, adjustment of the mA and/or kV according to
patient size and/or use of iterative reconstruction technique.

CONTRAST:  100mL OMNIPAQUE IOHEXOL 300 MG/ML  SOLN
FINDINGS: Lower chest: Normal

Hepatobiliary: Advanced steatosis of the liver. Calcified stones
dependent within the gallbladder. No CT evidence of gallbladder
inflammation or ductal stone.

Pancreas: Normal

Spleen: Normal

Adrenals/Urinary Tract: Adrenal glands are normal. Right kidney is
normal. Left kidney again shows a simple appearing cyst measuring
8.2 cm in diameter. No follow-up recommended. Bladder is normal.

Stomach/Bowel: Stomach and small intestine are normal. No sign of
bowel obstruction or inflammation. Normal appendix. Normal colon.

Vascular/Lymphatic: Aortic atherosclerosis. No aneurysm. IVC is
normal. No adenopathy.

Reproductive: Normal

Other: No free fluid or air.

Musculoskeletal: Lower lumbar degenerative changes.
IMPRESSION: Diffuse steatosis of the liver which can be symptomatic.

Chololithiasis without CT evidence of cholecystitis or
choledocholithiasis.

Aortic atherosclerosis.

## 2024-01-11 ENCOUNTER — Emergency Department

## 2024-01-11 ENCOUNTER — Other Ambulatory Visit: Payer: Self-pay

## 2024-01-11 ENCOUNTER — Observation Stay
Admission: EM | Admit: 2024-01-11 | Discharge: 2024-01-13 | Disposition: A | Discharge status: Home / Self Care | Attending: Student | Admitting: Student

## 2024-01-11 DIAGNOSIS — R0789 Other chest pain: Secondary | ICD-10-CM | POA: Insufficient documentation

## 2024-01-11 DIAGNOSIS — I10 Essential (primary) hypertension: Secondary | ICD-10-CM | POA: Diagnosis present

## 2024-01-11 DIAGNOSIS — Z7982 Long term (current) use of aspirin: Secondary | ICD-10-CM | POA: Diagnosis not present

## 2024-01-11 DIAGNOSIS — Z6839 Body mass index (BMI) 39.0-39.9, adult: Secondary | ICD-10-CM | POA: Diagnosis not present

## 2024-01-11 DIAGNOSIS — E559 Vitamin D deficiency, unspecified: Secondary | ICD-10-CM | POA: Diagnosis not present

## 2024-01-11 DIAGNOSIS — I152 Hypertension secondary to endocrine disorders: Secondary | ICD-10-CM | POA: Diagnosis present

## 2024-01-11 DIAGNOSIS — K3184 Gastroparesis: Principal | ICD-10-CM | POA: Insufficient documentation

## 2024-01-11 DIAGNOSIS — E785 Hyperlipidemia, unspecified: Secondary | ICD-10-CM | POA: Diagnosis not present

## 2024-01-11 DIAGNOSIS — R1084 Generalized abdominal pain: Secondary | ICD-10-CM | POA: Diagnosis not present

## 2024-01-11 DIAGNOSIS — B9681 Helicobacter pylori [H. pylori] as the cause of diseases classified elsewhere: Secondary | ICD-10-CM

## 2024-01-11 DIAGNOSIS — R109 Unspecified abdominal pain: Secondary | ICD-10-CM | POA: Diagnosis present

## 2024-01-11 DIAGNOSIS — Z794 Long term (current) use of insulin: Secondary | ICD-10-CM | POA: Insufficient documentation

## 2024-01-11 DIAGNOSIS — K8020 Calculus of gallbladder without cholecystitis without obstruction: Secondary | ICD-10-CM | POA: Diagnosis not present

## 2024-01-11 DIAGNOSIS — E1169 Type 2 diabetes mellitus with other specified complication: Secondary | ICD-10-CM | POA: Diagnosis present

## 2024-01-11 DIAGNOSIS — R142 Eructation: Secondary | ICD-10-CM | POA: Diagnosis not present

## 2024-01-11 DIAGNOSIS — G4730 Sleep apnea, unspecified: Secondary | ICD-10-CM | POA: Diagnosis not present

## 2024-01-11 DIAGNOSIS — R946 Abnormal results of thyroid function studies: Secondary | ICD-10-CM | POA: Diagnosis not present

## 2024-01-11 DIAGNOSIS — E1165 Type 2 diabetes mellitus with hyperglycemia: Secondary | ICD-10-CM | POA: Diagnosis present

## 2024-01-11 DIAGNOSIS — K209 Esophagitis, unspecified without bleeding: Secondary | ICD-10-CM

## 2024-01-11 DIAGNOSIS — R112 Nausea with vomiting, unspecified: Principal | ICD-10-CM | POA: Diagnosis present

## 2024-01-11 LAB — BASIC METABOLIC PANEL WITH GFR
Anion gap: 12 (ref 5–15)
BUN: 16 mg/dL (ref 8–23)
CO2: 24 mmol/L (ref 22–32)
Calcium: 9 mg/dL (ref 8.9–10.3)
Chloride: 98 mmol/L (ref 98–111)
Creatinine, Ser: 1.16 mg/dL (ref 0.61–1.24)
GFR, Estimated: >60 mL/min (ref >60)
Glucose, Bld: 241 mg/dL — ABNORMAL HIGH (ref 70–99)
Potassium: 3.9 mmol/L (ref 3.5–5.1)
Sodium: 134 mmol/L — ABNORMAL LOW (ref 135–145)

## 2024-01-11 LAB — LIPASE, BLOOD: Lipase: 26 U/L (ref 11–51)

## 2024-01-11 LAB — HEPATIC FUNCTION PANEL
ALT: 18 U/L (ref 0–44)
AST: 20 U/L (ref 15–41)
Albumin: 3.1 g/dL — ABNORMAL LOW (ref 3.5–5.0)
Alkaline Phosphatase: 78 U/L (ref 38–126)
Bilirubin, Direct: 0.3 mg/dL — ABNORMAL HIGH (ref 0.0–0.2)
Indirect Bilirubin: 2 mg/dL — ABNORMAL HIGH (ref 0.3–0.9)
Total Bilirubin: 2.3 mg/dL — ABNORMAL HIGH (ref 0.0–1.2)
Total Protein: 6.4 g/dL — ABNORMAL LOW (ref 6.5–8.1)

## 2024-01-11 LAB — CBC
HCT: 46.7 % (ref 39.0–52.0)
Hemoglobin: 16.3 g/dL (ref 13.0–17.0)
MCH: 33 pg (ref 26.0–34.0)
MCHC: 34.9 g/dL (ref 30.0–36.0)
MCV: 94.5 fL (ref 80.0–100.0)
Platelets: 338 10*3/uL (ref 150–400)
RBC: 4.94 MIL/uL (ref 4.22–5.81)
RDW: 12 % (ref 11.5–15.5)
WBC: 11.2 10*3/uL — ABNORMAL HIGH (ref 4.0–10.5)
nRBC: 0 % (ref 0.0–0.2)

## 2024-01-11 LAB — TROPONIN I (HIGH SENSITIVITY)
Troponin I (High Sensitivity): 50 ng/L — ABNORMAL HIGH (ref <18)
Troponin I (High Sensitivity): 51 ng/L — ABNORMAL HIGH (ref <18)

## 2024-01-11 LAB — TSH: TSH: 1.166 u[IU]/mL (ref 0.350–4.500)

## 2024-01-11 LAB — GLUCOSE, CAPILLARY: Glucose-Capillary: 239 mg/dL — ABNORMAL HIGH (ref 70–99)

## 2024-01-11 LAB — T4, FREE: Free T4: 1.46 ng/dL — ABNORMAL HIGH (ref 0.61–1.12)

## 2024-01-11 LAB — BRAIN NATRIURETIC PEPTIDE: B Natriuretic Peptide: 280.1 pg/mL — ABNORMAL HIGH (ref 0.0–100.0)

## 2024-01-11 MED ORDER — GABAPENTIN 300 MG PO CAPS: 300.0000 mg | ORAL_CAPSULE | Freq: Three times a day (TID) | ORAL | Status: DC

## 2024-01-11 MED ORDER — DROPERIDOL 2.5 MG/ML IJ SOLN
2.5000 mg | Freq: Once | INTRAMUSCULAR | Status: AC
  Administered 2024-01-11: 2.5 mg via INTRAVENOUS
  Filled 2024-01-11: qty 2

## 2024-01-11 MED ORDER — PANTOPRAZOLE SODIUM 40 MG IV SOLR
40.0000 mg | INTRAVENOUS | Status: DC
  Administered 2024-01-12 – 2024-01-13 (×2): 40 mg via INTRAVENOUS
  Filled 2024-01-11 (×2): qty 10

## 2024-01-11 MED ORDER — LOSARTAN POTASSIUM 50 MG PO TABS
100.0000 mg | ORAL_TABLET | Freq: Every day | ORAL | Status: DC
  Administered 2024-01-12 – 2024-01-13 (×2): 100 mg via ORAL
  Filled 2024-01-11 (×2): qty 2

## 2024-01-11 MED ORDER — ONDANSETRON HCL 4 MG/2ML IJ SOLN
4.0000 mg | Freq: Once | INTRAMUSCULAR | Status: AC
  Administered 2024-01-11: 4 mg via INTRAVENOUS
  Filled 2024-01-11: qty 2

## 2024-01-11 MED ORDER — METOCLOPRAMIDE HCL 5 MG/ML IJ SOLN
10.0000 mg | Freq: Once | INTRAMUSCULAR | Status: AC
  Administered 2024-01-11: 10 mg via INTRAVENOUS
  Filled 2024-01-11: qty 2

## 2024-01-11 MED ORDER — METOCLOPRAMIDE HCL 5 MG/ML IJ SOLN
10.0000 mg | Freq: Four times a day (QID) | INTRAMUSCULAR | Status: AC
  Administered 2024-01-12 (×2): 10 mg via INTRAVENOUS
  Filled 2024-01-11 (×2): qty 2

## 2024-01-11 MED ORDER — METOCLOPRAMIDE HCL 5 MG/ML IJ SOLN: 10.0000 mg | Freq: Once | INTRAMUSCULAR | Status: DC

## 2024-01-11 MED ORDER — SODIUM CHLORIDE 0.9 % IV SOLN: INTRAVENOUS | Status: DC

## 2024-01-11 MED ORDER — ENOXAPARIN SODIUM 60 MG/0.6ML IJ SOSY
0.5000 mg/kg | PREFILLED_SYRINGE | INTRAMUSCULAR | Status: DC
  Administered 2024-01-12: 55 mg via SUBCUTANEOUS
  Filled 2024-01-11: qty 0.6

## 2024-01-11 MED ORDER — ACETAMINOPHEN 325 MG PO TABS: 650.0000 mg | ORAL_TABLET | Freq: Four times a day (QID) | ORAL | Status: DC | PRN

## 2024-01-11 MED ORDER — ONDANSETRON HCL 4 MG/2ML IJ SOLN: 4.0000 mg | Freq: Four times a day (QID) | INTRAMUSCULAR | Status: DC | PRN

## 2024-01-11 MED ORDER — SODIUM CHLORIDE 0.9 % IV BOLUS
1000.0000 mL | Freq: Once | INTRAVENOUS | Status: AC
  Administered 2024-01-11: 1000 mL via INTRAVENOUS

## 2024-01-11 MED ORDER — MORPHINE SULFATE (PF) 4 MG/ML IV SOLN
4.0000 mg | Freq: Once | INTRAVENOUS | Status: AC
  Administered 2024-01-11: 4 mg via INTRAVENOUS
  Filled 2024-01-11: qty 1

## 2024-01-11 MED ORDER — FAMOTIDINE IN NACL 20-0.9 MG/50ML-% IV SOLN
20.0000 mg | Freq: Once | INTRAVENOUS | Status: AC
  Administered 2024-01-11: 20 mg via INTRAVENOUS
  Filled 2024-01-11: qty 50

## 2024-01-11 MED ORDER — ATORVASTATIN CALCIUM 20 MG PO TABS
40.0000 mg | ORAL_TABLET | Freq: Every day | ORAL | Status: DC
  Administered 2024-01-12 – 2024-01-13 (×2): 40 mg via ORAL
  Filled 2024-01-11 (×2): qty 2

## 2024-01-11 MED ORDER — ACETAMINOPHEN 650 MG RE SUPP: 650.0000 mg | Freq: Four times a day (QID) | RECTAL | Status: DC | PRN

## 2024-01-11 MED ORDER — ONDANSETRON HCL 4 MG PO TABS: 4.0000 mg | ORAL_TABLET | Freq: Four times a day (QID) | ORAL | Status: DC | PRN

## 2024-01-11 MED ORDER — IOHEXOL 300 MG/ML  SOLN
100.0000 mL | Freq: Once | INTRAMUSCULAR | Status: AC | PRN
  Administered 2024-01-11: 100 mL via INTRAVENOUS

## 2024-01-11 MED ORDER — AMLODIPINE BESYLATE 10 MG PO TABS
10.0000 mg | ORAL_TABLET | Freq: Every day | ORAL | Status: DC
  Administered 2024-01-12 – 2024-01-13 (×2): 10 mg via ORAL
  Filled 2024-01-11 (×2): qty 1

## 2024-01-11 MED ORDER — INSULIN ASPART 100 UNIT/ML IJ SOLN
0.0000 [IU] | INTRAMUSCULAR | Status: DC
  Administered 2024-01-12: 2 [IU] via SUBCUTANEOUS
  Administered 2024-01-12: 3 [IU] via SUBCUTANEOUS
  Administered 2024-01-12 (×2): 5 [IU] via SUBCUTANEOUS
  Filled 2024-01-11 (×5): qty 1

## 2024-01-11 NOTE — ED Provider Notes (Signed)
 [facility] Emergency Department Provider Note     Event Date/Time   First MD Initiated Contact with Patient 01/11/24 1525     (approximate)   History   Abdominal Pain, Chest Pain, and Emesis   HPI  Corey Petersen is a 63 y.o. male with a history of HTN, DM, obesity, HLD, OSA, GERD, gastroparesis, and chronic H. pylori gastritis, presents himself to the ED for intermittent abdominal pain, intractable nausea and vomiting.  Patient would endorse episode this week, noting his last bowel movement was about a week ago.  He endorses central chest discomfort excessive belching, and emesis.  Patient reports similar episodes in the past including GI workups and GI referrals.  He does not have any nausea medicine at home, but does endorse taking his other home meds as prescribed.  No fevers, chills, or sweats reported.  Patient gets his primary care through the [facility]  Patient would report that this has been his normal daily occurrence for at least the last 2 years without a clear etiology or cause of his symptoms.   Physical Exam   Triage Vital Signs: ED Triage Vitals [01/11/24 1408]  Encounter Vitals Group     BP (!) 147/91     Systolic BP Percentile      Diastolic BP Percentile      Pulse Rate (!) 106     Resp 20     Temp 98.6 F (37 C)     Temp Source Oral     SpO2 98 %     Weight      Height      Head Circumference      Peak Flow      Pain Score 0     Pain Loc      Pain Education      Exclude from Growth Chart     Most recent vital signs: Vitals:   01/11/24 1745 01/11/24 1948  BP:  (!) 162/98  Pulse: 99 (!) 101  Resp:  16  Temp:  99.2 F (37.3 C)  SpO2: 95% 98%    General Awake, no distress.  HEENT NCAT. PERRL. EOMI. No rhinorrhea. Mucous membranes are moist.  CV:  Good peripheral perfusion.  RRR RESP:  Normal effort.  CTA ABD:  No distention.  No rebound, guarding, or rigidity noted.  Normal bowel sounds appreciated.  Intermittent coughing, belching, vomiting   ED Results / Procedures / Treatments   Labs (all labs ordered are listed, but only abnormal results are displayed) Labs Reviewed  BASIC METABOLIC PANEL WITH GFR - Abnormal; Notable for the following components:      Result Value   Sodium 134 (*)    Glucose, Bld 241 (*)    All other components within normal limits  CBC - Abnormal; Notable for the following components:   WBC 11.2 (*)    All other components within normal limits  BRAIN NATRIURETIC PEPTIDE - Abnormal; Notable for the following components:   B Natriuretic Peptide 280.1 (*)    All other components within normal limits  HEPATIC FUNCTION PANEL - Abnormal; Notable for the following components:   Total Protein 6.4 (*)    Albumin 3.1 (*)    Total Bilirubin 2.3 (*)    Bilirubin, Direct 0.3 (*)    Indirect Bilirubin 2.0 (*)    All other components within normal limits  TROPONIN I (HIGH SENSITIVITY) - Abnormal; Notable for the following components:   Troponin I (  High Sensitivity) 50 (*)    All other components within normal limits  TROPONIN I (HIGH SENSITIVITY) - Abnormal; Notable for the following components:   Troponin I (High Sensitivity) 51 (*)    All other components within normal limits  LIPASE, BLOOD  TSH  T4, FREE  URINALYSIS, ROUTINE W REFLEX MICROSCOPIC   EKG  Vent. rate 115 BPM PR interval 132 ms QRS duration 74 ms QT/QTcB 314/434 ms P-R-T axes 0 -19 126 Sinus tachycardia with occasional and consecutive Premature ventricular complexes Left ventricular hypertrophy with repolarization abnormality ( R in aVL ) Abnormal ECG No STEMI  RADIOLOGY  I personally viewed and evaluated these images as part of my medical decision making, as well as reviewing the written report by the radiologist.  ED Provider Interpretation: Negative chest x-ray; some cholelithiasis without evidence of cholecystitis on abdominal pelvic CT and dedicated right upper quadrant  ultrasound  US  Abdomen Limited RUQ (LIVER/GB) Result Date: 01/11/2024 CLINICAL DATA:  Nausea and vomiting, cholelithiasis EXAM: ULTRASOUND ABDOMEN LIMITED RIGHT UPPER QUADRANT COMPARISON:  01/11/2024 FINDINGS: Gallbladder: Multiple shadowing gallstones layer dependently within the gallbladder. No gallbladder wall thickening or pericholecystic fluid. Negative sonographic Murphy sign. Common bile duct: Diameter: 3 mm Liver: No focal lesion identified. Within normal limits in parenchymal echogenicity. Portal vein is patent on color Doppler imaging with normal direction of blood flow towards the liver. Other: None. IMPRESSION: 1. Cholelithiasis without evidence of acute cholecystitis. Electronically Signed   By: Bobbye Burrow M.D.   On: 01/11/2024 18:48   CT ABDOMEN PELVIS W CONTRAST Result Date: 01/11/2024 CLINICAL DATA:  Abdominal pain, acute, nonlocalized EXAM: CT ABDOMEN AND PELVIS WITH CONTRAST TECHNIQUE: Multidetector CT imaging of the abdomen and pelvis was performed using the standard protocol following bolus administration of intravenous contrast. RADIATION DOSE REDUCTION: This exam was performed according to the departmental dose-optimization program which includes automated exposure control, adjustment of the mA and/or kV according to patient size and/or use of iterative reconstruction technique. CONTRAST:  OMNIPAQUE  IOHEXOL  300 MG/ML  SOLN COMPARISON:  07/08/2022 FINDINGS: Lower chest: No acute findings Hepatobiliary: Layering gallstones within the gallbladder. No biliary ductal dilatation or focal hepatic abnormality. Pancreas: No focal abnormality or ductal dilatation. Spleen: No focal abnormality.  Normal size. Adrenals/Urinary Tract: Adrenal glands normal. 9 cm cyst in the midpole of the left kidney is stable since prior study. No follow-up imaging recommended. No stones or hydronephrosis. Urinary bladder unremarkable. Stomach/Bowel: Stomach, large and small bowel grossly unremarkable. Normal  appendix. Vascular/Lymphatic: No evidence of aneurysm or adenopathy. Reproductive: No visible focal abnormality. Other: No free fluid or free air. Musculoskeletal: No acute bony abnormality. IMPRESSION: Cholelithiasis.  No CT evidence of acute cholecystitis. No acute findings. Electronically Signed   By: Janeece Mechanic M.D.   On: 01/11/2024 17:50   DG Chest 2 View Result Date: 01/11/2024 CLINICAL DATA:  Chest pain. EXAM: CHEST - 2 VIEW COMPARISON:  Chest radiograph dated 07/08/2022. FINDINGS: The heart size and mediastinal contours are within normal limits. Both lungs are clear. The visualized skeletal structures are unremarkable. IMPRESSION: No active cardiopulmonary disease. Electronically Signed   By: Angus Bark M.D.   On: 01/11/2024 14:44     PROCEDURES:  Critical Care performed: No  Procedures   MEDICATIONS ORDERED IN ED: Medications  sodium chloride  0.9 % bolus 1,000 mL (0 mLs Intravenous Stopped 01/11/24 1751)  famotidine  (PEPCID ) IVPB 20 mg premix (0 mg Intravenous Stopped 01/11/24 1728)  ondansetron  (ZOFRAN ) injection 4 mg (4 mg Intravenous Given 01/11/24 1648)  droperidol  (INAPSINE ) 2.5 MG/ML injection 2.5 mg (2.5 mg Intravenous Given 01/11/24 1750)  iohexol  (OMNIPAQUE ) 300 MG/ML solution 100 mL (100 mLs Intravenous Contrast Given 01/11/24 1728)  metoCLOPramide  (REGLAN ) injection 10 mg (10 mg Intravenous Given 01/11/24 1950)  morphine  (PF) 4 MG/ML injection 4 mg (4 mg Intravenous Given 01/11/24 2145)     IMPRESSION / MDM / ASSESSMENT AND PLAN / ED COURSE  I reviewed the triage vital signs and the nursing notes.                              Differential diagnosis includes, but is not limited to, acute appendicitis, renal colic, testicular torsion, urinary tract infection/pyelonephritis, diverticulitis, small bowel obstruction or ileus, colitis, abdominal aortic aneurysm, gastroenteritis, hernia, ACS, aortic dissection, pulmonary embolism, cardiac tamponade, pneumothorax, pneumonia,  pericarditis, myocarditis, esophagitis/gastritis, and musculoskeletal chest wall pain.     Patient's presentation is most consistent with acute presentation with potential threat to life or bodily function.  Patient's diagnosis is consistent with acute exacerbation of chronic intractable nausea and vomiting and likely gastroparesis.  Patient continues to endorse epigastric and central chest pressure and ongoing retching.  His cardiac workup is overall reassuring with a flat troponin x 2.  Mildly elevated BNP, and no other significant lab abnormalities.  He did show some elevation of his bilirubin, above baseline, which was evaluated further with the right upper quadrant ultrasound.  Ultrasound confirms gallstones without evidence of acute cholecystitis or ductal dilatation.  Similar findings on contrasted abdomen/pelvis CT, interpreted by me.  Chest x-ray negative for any acute intrathoracic process.  EKG without evidence of malignant arrhythmia.  No signs of dehydration despite his reports of near daily symptoms for the last 2 years.  He did tolerate p.o. without subsequent emesis after dose of Reglan .  Patient has had IV doses of Inapsine , Zofran , normal saline, famotidine , and ultimately morphine  for his reports of chest pain or discomfort.  I discussed with the patient the option of admission for ongoing management of intractable nausea and vomiting versus trial of at home suppositories for nausea and vomiting.  Patient advised that he felt he would have recurrence of the symptoms if he left the ED and was unclear of how he would be able to tolerate the intractable nausea, vomiting, and chest pain.    ----------------------------------------- 9:59 PM on 01/11/2024 ----------------------------------------- Consulted Dr. Brion Cancel with hospital service, we reviewed the case including labs, treatments and interventions.  She is agreeable to the plan to admit to MedSurg for ongoing evaluation management  of his acute on chronic intractable nausea, vomiting, gastroparesis, and atypical chest pain.     FINAL CLINICAL IMPRESSION(S) / ED DIAGNOSES   Final diagnoses:  Gastroparesis  Intractable nausea and vomiting  Belching symptom  Atypical chest pain     Rx / DC Orders   ED Discharge Orders     None        Note:  This document was prepared using Dragon voice recognition software and may include unintentional dictation errors.    May Sparks, PA-C 01/11/24 2201    Lubertha Rush, MD 01/12/24 [phone]

## 2024-01-11 NOTE — ED Triage Notes (Signed)
 Pt to ED via [facility] from home. Pt reports intermittent abd pain, CP, SOB, N/V and constipation x5 days. Pt reports last whole BM 5 days ago. Pt states passing small stool since.

## 2024-01-11 NOTE — ED Triage Notes (Signed)
 First Nurse Note:  Pt via [facility] from home. Pt c/o abd pain, radiates to his neck. Reports last BM was 2 weeks ago, reports OTC medication without relief. Pt is A&Ox4 and NAD  152/78 BP 102 HR  98% on RA  244 CBG

## 2024-01-11 NOTE — ED Notes (Signed)
 Pt sipping on ginger ale as requested for fluid challenge

## 2024-01-12 DIAGNOSIS — R112 Nausea with vomiting, unspecified: Secondary | ICD-10-CM | POA: Diagnosis not present

## 2024-01-12 LAB — URINALYSIS, ROUTINE W REFLEX MICROSCOPIC
Bilirubin Urine: NEGATIVE
Glucose, UA: 50 mg/dL — AB
Hgb urine dipstick: NEGATIVE
Ketones, ur: 5 mg/dL — AB
Leukocytes,Ua: NEGATIVE
Nitrite: NEGATIVE
Protein, ur: 30 mg/dL — AB
Specific Gravity, Urine: >1.046 — ABNORMAL HIGH (ref 1.005–1.030)
pH: 5 (ref 5.0–8.0)

## 2024-01-12 LAB — GLUCOSE, CAPILLARY
Glucose-Capillary: 111 mg/dL — ABNORMAL HIGH (ref 70–99)
Glucose-Capillary: 121 mg/dL — ABNORMAL HIGH (ref 70–99)
Glucose-Capillary: 182 mg/dL — ABNORMAL HIGH (ref 70–99)
Glucose-Capillary: 209 mg/dL — ABNORMAL HIGH (ref 70–99)
Glucose-Capillary: 229 mg/dL — ABNORMAL HIGH (ref 70–99)
Glucose-Capillary: 85 mg/dL (ref 70–99)

## 2024-01-12 LAB — CBC
HCT: 44.6 % (ref 39.0–52.0)
Hemoglobin: 15.1 g/dL (ref 13.0–17.0)
MCH: 32.6 pg (ref 26.0–34.0)
MCHC: 33.9 g/dL (ref 30.0–36.0)
MCV: 96.3 fL (ref 80.0–100.0)
Platelets: 232 10*3/uL (ref 150–400)
RBC: 4.63 MIL/uL (ref 4.22–5.81)
RDW: 12.2 % (ref 11.5–15.5)
WBC: 8.8 10*3/uL (ref 4.0–10.5)
nRBC: 0 % (ref 0.0–0.2)

## 2024-01-12 LAB — BASIC METABOLIC PANEL WITH GFR
Anion gap: 12 (ref 5–15)
BUN: 18 mg/dL (ref 8–23)
CO2: 26 mmol/L (ref 22–32)
Calcium: 8.2 mg/dL — ABNORMAL LOW (ref 8.9–10.3)
Chloride: 99 mmol/L (ref 98–111)
Creatinine, Ser: 1.04 mg/dL (ref 0.61–1.24)
GFR, Estimated: >60 mL/min (ref >60)
Glucose, Bld: 122 mg/dL — ABNORMAL HIGH (ref 70–99)
Potassium: 3.5 mmol/L (ref 3.5–5.1)
Sodium: 137 mmol/L (ref 135–145)

## 2024-01-12 LAB — HIV ANTIBODY (ROUTINE TESTING W REFLEX): HIV Screen 4th Generation wRfx: NONREACTIVE

## 2024-01-12 MED ORDER — DEXTROSE-SODIUM CHLORIDE 5-0.9 % IV SOLN: INTRAVENOUS | Status: AC

## 2024-01-12 NOTE — Plan of Care (Signed)

## 2024-01-12 NOTE — Progress Notes (Signed)
 Briefly, patient is a 63 year old man with DM2 and gastroparesis who was admitted earlier this morning with intractable nausea and vomiting.  No blood in vomitus.    This morning patient states he feels improved, has not had any vomiting since admission.  Patient notes he has had symptoms like this before but it has never been quite so bad.   Physical exam is notable for a patient appearing older than stated age lying in bed in NAD.  Abdomen was nontender.  Assessment and plan Conservative management to support gastroparesis appears to be effective, continue present therapeutic regimen.  Patient follows at the [facility], can consider referral to GI for possible gastric electric stimulator.  I have discussed this with patient and he will bring it up with his PCP after discharge.

## 2024-01-12 NOTE — Assessment & Plan Note (Addendum)
 Suspect diabetic gastroparesis History of esophagitis on EGD 2023 History of H. pylori Ice chips only for now IV Protonix  Reglan  every 6h x2 , Zofran  as needed with Phenergan for breakthrough IV hydration GI consult for consideration of EGD

## 2024-01-12 NOTE — Assessment & Plan Note (Signed)
 Continue amlodipine

## 2024-01-12 NOTE — Consult Note (Signed)
 [facility] GI Inpatient Consult Note   Dorris Gaul, M.D.  Reason for Consult: "Intractable nausea and vomiting".   Attending Requesting Consult: Brion Cancel, M.D.  History of Present Illness: Corey Petersen is a 63 y.o. male with a history of uncontrolled type 2 diabetes mellitus, H. pylori gastritis, obesity, hypertension and elevated liver associated enzymes who presents with 3 days of progressive nausea and vomiting.  Patient has a diagnosis of diabetic polyneuropathy with likely gastroparesis.  Patient denies any abdominal pain, although he does claim a small amount of bright red blood in his emesis.  Hemoglobin seems to be very stable at 16.3.  Patient denies any personal history of peptic ulcer disease.  He takes no prescription anticoagulation.  Other than constipation, patient denies any symptoms of hematochezia, involuntary weight loss.  Patient reports undergoing colonoscopy several years ago by Dr. Marnee Sink .  I reviewed this and noted that procedure was performed on 03/09/2022 2 small polyps were removed revealing tubular adenoma (2), and Hyperplastic polyp (1). EGD was performed the very same day revealing H. pylori gastritis.Today, the patient has undergone antiemetic therapy and reports feeling better with less nausea.  He still continues to have some nausea, however, and has some anorexia.  Past Medical History:  Past Medical History:  Diagnosis Date   Diabetes mellitus without complication (HCC)    DKA, type 2 (HCC) 07/09/2022   Hypertension     Problem List: Patient Active Problem List   Diagnosis Date Noted   Intractable nausea and vomiting 01/11/2024   Neuropathy, arm, left 10/19/2022   Chronic Helicobacter pylori gastritis 10/17/2022   Type 2 diabetes mellitus (HCC) 10/17/2022   Degeneration of lumbar intervertebral disc 10/17/2022   Lumbar spondylosis 10/17/2022   Multiple joint disease 10/17/2022   Obesity 10/17/2022   Osteoarthritis of knee  10/17/2022   Chronic low back pain 10/17/2022   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   Urinary hesitancy 10/17/2022   Gastroparesis 09/05/2022   Hypokalemia 09/05/2022   Gastritis, Helicobacter pylori 09/05/2022   Chest pain 09/05/2022   Diabetic polyneuropathy associated with type 2 diabetes mellitus (HCC) 09/05/2022   Scrotal mass 09/05/2022   Constipation 09/05/2022   Numbness and tingling in left hand 09/05/2022   Severe sepsis with acute organ dysfunction (HCC) 07/08/2022   Hyperglycemia due to type 2 diabetes mellitus (HCC) 07/08/2022   Elevated liver enzymes 07/08/2022   Belching 07/08/2022   Loss of weight    Dyspepsia    Diabetes mellitus (HCC) 02/13/2022   Hyperlipidemia associated with type 2 diabetes mellitus (HCC) 02/13/2022   Vitamin D deficiency 02/13/2022   Sleep apnea 02/13/2022   Insulin  dependent type 2 diabetes mellitus (HCC) 02/13/2022   Abnormal liver function 02/13/2022   Hypertension associated with diabetes (HCC) 02/13/2022    Past Surgical History: Past Surgical History:  Procedure Laterality Date   COLONOSCOPY N/A 03/09/2022   Procedure: COLONOSCOPY;  Surgeon: Marnee Sink, MD;  Location: [facility] ENDOSCOPY;  Service: Endoscopy;  Laterality: N/A;   ESOPHAGOGASTRODUODENOSCOPY N/A 03/09/2022   Procedure: ESOPHAGOGASTRODUODENOSCOPY (EGD);  Surgeon: Marnee Sink, MD;  Location: [facility] ENDOSCOPY;  Service: Endoscopy;  Laterality: N/A;   HERNIA REPAIR      Allergies: Allergies  Allergen Reactions   Ace Inhibitors Cough    Home Medications: Medications Prior to Admission  Medication Sig Dispense Refill Last Dose/Taking   amLODipine  (NORVASC ) 10 MG tablet Take 1 tablet (10 mg total) by mouth daily. 90 tablet 3 01/10/2024  aspirin  EC 81 MG tablet Take 1 tablet (81 mg total) by mouth daily. Swallow whole. 30 tablet 12 Past Week   atorvastatin  (LIPITOR) 40 MG tablet TAKE 1 TABLET BY MOUTH EVERY DAY 90 tablet 0 01/10/2024   calcium   carbonate (TUMS - DOSED IN MG ELEMENTAL CALCIUM ) 500 MG chewable tablet Chew 1 tablet (200 mg of elemental calcium  total) by mouth 3 (three) times daily with meals. (Patient taking differently: Chew 200 mg of elemental calcium  by mouth 3 (three) times daily with meals. As needed) 90 tablet 1 01/11/2024   gabapentin  (NEURONTIN ) 300 MG capsule Take 1-2 capsules (300-600 mg total) by mouth 3 (three) times daily. 90 capsule 3 01/10/2024   insulin  glargine (LANTUS  SOLOSTAR) 100 UNIT/ML Solostar Pen INJECT 75 UNITS INTO THE SKIN AT BEDTIME. 15 mL 0 01/10/2024 Bedtime   losartan  (COZAAR ) 100 MG tablet Take 1 tablet (100 mg total) by mouth daily. 90 tablet 2 01/10/2024   metFORMIN  (GLUCOPHAGE -XR) 500 MG 24 hr tablet Take 500 mg by mouth 2 (two) times daily with a meal.   01/10/2024   pantoprazole  (PROTONIX ) 20 MG tablet Take 1 tablet (20 mg total) by mouth daily. 60 tablet 3 01/10/2024   Blood Glucose Monitoring Suppl (ONETOUCH VERIO) w/Device KIT Use to check blood sugar before meals and fasting, as as needed 1 kit 0    CORLANOR 5 MG TABS tablet Take by mouth. (Patient not taking: Reported on 01/11/2024)   Not Taking   glipiZIDE  (GLUCOTROL ) 10 MG tablet Take 1 tablet (10 mg total) by mouth 2 (two) times daily before a meal. (Patient not taking: Reported on 01/11/2024) 180 tablet 1 Not Taking   glucose blood test strip Use as instructed 100 each 12    Insulin  Pen Needle 29G X 12.7MM MISC 1 Needle by Does not apply route at bedtime. 100 each 3    metFORMIN  (GLUCOPHAGE ) 1000 MG tablet Take 1,000 mg by mouth.      tadalafil  (CIALIS ) 20 MG tablet TAKE 1 TABLET BY MOUTH EVERY DAY AS NEEDED FOR ERECTILE DYSFUNCTION 30 tablet 5    Home medication reconciliation was completed with the patient.   Scheduled Inpatient Medications:    amLODipine   10 mg Oral Daily   atorvastatin   40 mg Oral Daily   enoxaparin  (LOVENOX ) injection  0.5 mg/kg Subcutaneous Q24H   insulin  aspart  0-15 Units Subcutaneous Q4H   losartan   100 mg Oral  Daily   pantoprazole  (PROTONIX ) IV  40 mg Intravenous Q24H    Continuous Inpatient Infusions:    dextrose  5 % and 0.9 % NaCl 40 mL/hr at 01/12/24 0554    PRN Inpatient Medications:  acetaminophen  **OR** acetaminophen , ondansetron  **OR** ondansetron  (ZOFRAN ) IV  Family History: family history includes Colon cancer in his father; Diabetes in his mother; Hypertension in his father and mother; Seizures in his mother.   GI Family History: Negative  Social History:   reports that he has never smoked. He has never used smokeless tobacco. He reports that he does not drink alcohol and does not use drugs. The patient denies ETOH, tobacco, or drug use.    Review of Systems: Review of Systems - Negative except history of present illness  Physical Examination: BP (!) 164/91 (BP Location: Left Arm)   Pulse 78   Temp 98.2 F (36.8 C)   Resp 16   Ht 5\' 6"  (1.676 m)   Wt 109.8 kg   SpO2 98%   BMI 39.06 kg/m  Physical Exam Constitutional:  General: He is not in acute distress.    Appearance: He is well-developed. He is obese. He is ill-appearing. He is not toxic-appearing.  HENT:     Head: Normocephalic and atraumatic.     Mouth/Throat:     Pharynx: Oropharynx is clear.  Eyes:     General: No scleral icterus.    Pupils: Pupils are equal, round, and reactive to light.  Cardiovascular:     Rate and Rhythm: Normal rate and regular rhythm.     Heart sounds: Normal heart sounds. No murmur heard.    No gallop.  Pulmonary:     Effort: Pulmonary effort is normal.     Breath sounds: Normal breath sounds.  Abdominal:     General: Bowel sounds are normal. There is no distension.     Palpations: Abdomen is soft. There is no shifting dullness or hepatomegaly.     Tenderness: There is no abdominal tenderness.     Hernia: No hernia is present.  Skin:    General: Skin is warm and dry.     Coloration: Skin is not cyanotic, jaundiced or mottled.  Neurological:     General: No focal  deficit present.     Mental Status: He is alert.  Psychiatric:        Mood and Affect: Mood normal.        Behavior: Behavior normal.     Data: Lab Results  Component Value Date   WBC 11.2 (H) 01/11/2024   HGB 16.3 01/11/2024   HCT 46.7 01/11/2024   MCV 94.5 01/11/2024   PLT 338 01/11/2024   Recent Labs  Lab 01/11/24 1409  HGB 16.3   Lab Results  Component Value Date   NA 134 (L) 01/11/2024   K 3.9 01/11/2024   CL 98 01/11/2024   CO2 24 01/11/2024   BUN 16 01/11/2024   CREATININE 1.16 01/11/2024   Lab Results  Component Value Date   ALT 18 01/11/2024   AST 20 01/11/2024   ALKPHOS 78 01/11/2024   BILITOT 2.3 (H) 01/11/2024   No results for input(s): "APTT", "INR", "PTT" in the last 168 hours.    Latest Ref Rng & Units 01/11/2024    2:09 PM 09/05/2022   10:09 AM 07/09/2022    4:48 AM  CBC  WBC 4.0 - 10.5 K/uL 11.2  7.4  14.3   Hemoglobin 13.0 - 17.0 g/dL 09.8  11.9  14.7   Hematocrit 39.0 - 52.0 % 46.7  49.9  39.7   Platelets 150 - 400 K/uL 338  272  217     STUDIES: US  Abdomen Limited RUQ (LIVER/GB) Result Date: 01/11/2024 CLINICAL DATA:  Nausea and vomiting, cholelithiasis EXAM: ULTRASOUND ABDOMEN LIMITED RIGHT UPPER QUADRANT COMPARISON:  01/11/2024 FINDINGS: Gallbladder: Multiple shadowing gallstones layer dependently within the gallbladder. No gallbladder wall thickening or pericholecystic fluid. Negative sonographic Murphy sign. Common bile duct: Diameter: 3 mm Liver: No focal lesion identified. Within normal limits in parenchymal echogenicity. Portal vein is patent on color Doppler imaging with normal direction of blood flow towards the liver. Other: None. IMPRESSION: 1. Cholelithiasis without evidence of acute cholecystitis. Electronically Signed   By: Bobbye Burrow M.D.   On: 01/11/2024 18:48   CT ABDOMEN PELVIS W CONTRAST Result Date: 01/11/2024 CLINICAL DATA:  Abdominal pain, acute, nonlocalized EXAM: CT ABDOMEN AND PELVIS WITH CONTRAST TECHNIQUE:  Multidetector CT imaging of the abdomen and pelvis was performed using the standard protocol following bolus administration of intravenous contrast. RADIATION DOSE REDUCTION: This  exam was performed according to the departmental dose-optimization program which includes automated exposure control, adjustment of the mA and/or kV according to patient size and/or use of iterative reconstruction technique. CONTRAST:  OMNIPAQUE  IOHEXOL  300 MG/ML  SOLN COMPARISON:  07/08/2022 FINDINGS: Lower chest: No acute findings Hepatobiliary: Layering gallstones within the gallbladder. No biliary ductal dilatation or focal hepatic abnormality. Pancreas: No focal abnormality or ductal dilatation. Spleen: No focal abnormality.  Normal size. Adrenals/Urinary Tract: Adrenal glands normal. 9 cm cyst in the midpole of the left kidney is stable since prior study. No follow-up imaging recommended. No stones or hydronephrosis. Urinary bladder unremarkable. Stomach/Bowel: Stomach, large and small bowel grossly unremarkable. Normal appendix. Vascular/Lymphatic: No evidence of aneurysm or adenopathy. Reproductive: No visible focal abnormality. Other: No free fluid or free air. Musculoskeletal: No acute bony abnormality. IMPRESSION: Cholelithiasis.  No CT evidence of acute cholecystitis. No acute findings. Electronically Signed   By: Janeece Mechanic M.D.   On: 01/11/2024 17:50   DG Chest 2 View Result Date: 01/11/2024 CLINICAL DATA:  Chest pain. EXAM: CHEST - 2 VIEW COMPARISON:  Chest radiograph dated 07/08/2022. FINDINGS: The heart size and mediastinal contours are within normal limits. Both lungs are clear. The visualized skeletal structures are unremarkable. IMPRESSION: No active cardiopulmonary disease. Electronically Signed   By: Angus Bark M.D.   On: 01/11/2024 14:44   @IMAGES @  Assessment:  Nausea and vomiting. Presumptive history of diabetic gastroparesis. Uncontrolled Type II DM. Obesity. Hx of H Pylori gastritis.  Review of records reveals patient told Dr. Curtis Dow office in 2023 that [facility] infection, orders written to check for eradication with urease breath test but unclear if patient ever underwent the test. 6.   Hemetemesis.  Recommendations:  IV acid suppressioin. Clear liquids ok today. Serial CBC. EGD tomorrow. The patient understands the nature of the planned procedure, indications, risks, alternatives and potential complications including but not limited to bleeding, infection, perforation, damage to internal organs and possible oversedation/side effects from anesthesia. The patient agrees and gives consent to proceed.  Please refer to procedure notes for findings, recommendations and patient disposition/instructions.   Thank you for the consult. Please call with questions or concerns.  Mardy Shall, "Sylvester Evert MD [facility] [address] [place], [place] [address] [phone]  01/12/2024 8:47 AM

## 2024-01-12 NOTE — Assessment & Plan Note (Signed)
 Continue atorvastatin 

## 2024-01-12 NOTE — Progress Notes (Signed)
 Anticoagulation monitoring(Lovenox ):  63 yo male ordered Lovenox  40 mg Q24h    Filed Weights   01/12/24 0000  Weight: 109.8 kg (242 lb)   BMI 39.1     Lab Results  Component Value Date   CREATININE 1.16 01/11/2024   CREATININE 1.13 07/01/2023   CREATININE 0.72 10/23/2022   Estimated Creatinine Clearance: 76.8 mL/min (by C-G formula based on SCr of 1.16 mg/dL). Hemoglobin & Hematocrit     Component Value Date/Time   HGB 16.3 01/11/2024 1409   HGB 17.0 09/05/2022 1009   HCT 46.7 01/11/2024 1409   HCT 49.9 09/05/2022 1009     Per Protocol for Patient with estCrcl > 30 ml/min and BMI > 30, will transition to Lovenox  55 mg Q24h.

## 2024-01-12 NOTE — Assessment & Plan Note (Signed)
 Sliding scale insulin coverage

## 2024-01-12 NOTE — Assessment & Plan Note (Signed)
 Has used Reglan  and baclofen  in the past with improvement Continue IV Reglan  as above.  Can add baclofen  when tolerating p.o.

## 2024-01-12 NOTE — TOC Initial Note (Signed)
 Transition of Care [facility]) - Initial/Assessment Note    Patient Details  Name: Corey Petersen MRN: [id number] Date of Birth: 1960-11-26  [facility] CM/SW Contact:    Alexandra Ice, RN Phone Number: 01/12/2024, 10:07 AM  Clinical Narrative:                 Patient lives with spouse. Goes to [facility] for PCP. He is planned for EGD tomorrow 01/13/2024, with GI. No discharge needs identified by TOC at this time.         Patient Goals and CMS Choice            Expected Discharge Plan and Services                                              Prior Living Arrangements/Services                       Activities of Daily Living   ADL Screening (condition at time of admission) Independently performs ADLs?: Yes (appropriate for developmental age) Is the patient deaf or have difficulty hearing?: No Does the patient have difficulty seeing, even when wearing glasses/contacts?: No Does the patient have difficulty concentrating, remembering, or making decisions?: No  Permission Sought/Granted                  Emotional Assessment              Admission diagnosis:  Gastroparesis [K31.84] Belching symptom [R14.2] Atypical chest pain [R07.89] Intractable nausea and vomiting [R11.2] Patient Active Problem List   Diagnosis Date Noted   Intractable nausea and vomiting 01/11/2024   Neuropathy, arm, left 10/19/2022   Chronic Helicobacter pylori gastritis 10/17/2022   Type 2 diabetes mellitus (HCC) 10/17/2022   Degeneration of lumbar intervertebral disc 10/17/2022   Lumbar spondylosis 10/17/2022   Multiple joint disease 10/17/2022   Obesity 10/17/2022   Osteoarthritis of knee 10/17/2022   Chronic low back pain 10/17/2022   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   Urinary hesitancy 10/17/2022   Gastroparesis 09/05/2022   Hypokalemia 09/05/2022   Gastritis, Helicobacter pylori 09/05/2022   Chest pain  09/05/2022   Diabetic polyneuropathy associated with type 2 diabetes mellitus (HCC) 09/05/2022   Scrotal mass 09/05/2022   Constipation 09/05/2022   Numbness and tingling in left hand 09/05/2022   Severe sepsis with acute organ dysfunction (HCC) 07/08/2022   Hyperglycemia due to type 2 diabetes mellitus (HCC) 07/08/2022   Elevated liver enzymes 07/08/2022   Belching 07/08/2022   Loss of weight    Dyspepsia    Diabetes mellitus (HCC) 02/13/2022   Hyperlipidemia associated with type 2 diabetes mellitus (HCC) 02/13/2022   Vitamin D deficiency 02/13/2022   Sleep apnea 02/13/2022   Insulin  dependent type 2 diabetes mellitus (HCC) 02/13/2022   Abnormal liver function 02/13/2022   Hypertension associated with diabetes (HCC) 02/13/2022   PCP:  Mimi Alt, MD Pharmacy:   [id number] [place], [place] - [address] [place] [address] Phone: [phone] Fax: [phone]  [facility] [address], [place] - [address] [place] [address] Phone: [phone] Fax: [phone]     Social Drivers of Health (SDOH) Social History: SDOH Screenings   Food Insecurity: No Food Insecurity (01/11/2024)  Housing: Low Risk  (01/11/2024)  Transportation Needs: No Transportation Needs (01/11/2024)  Utilities: Not At Risk (01/11/2024)  Alcohol Screen: Low Risk  (06/19/2023)  Depression (PHQ2-9): Medium Risk (07/01/2023)  Financial Resource Strain: Low Risk  (06/19/2023)  Physical Activity: Inactive (06/19/2023)  Social Connections: Moderately Integrated (06/19/2023)  Stress: No Stress Concern Present (06/19/2023)  Tobacco Use: Low Risk  (01/11/2024)  Health Literacy: Adequate Health Literacy (06/19/2023)   SDOH Interventions:     Readmission Risk Interventions     No data to display

## 2024-01-12 NOTE — H&P (Signed)
 History and Physical    Patient: Corey Petersen ZOX:[id number] DOB: Mar 06, 1961 DOA: 01/11/2024 DOS: the patient was seen and examined on 01/12/2024 PCP: Mimi Alt, MD  Patient coming from: Home  Chief Complaint:  Chief Complaint  Patient presents with   Abdominal Pain   Chest Pain   Emesis   HPI: Corey Petersen is a 63 y.o. male with medical history significant of insulin -dependent diabetes mellitus, hypertension, esophagitis and prior H. pylori, gastroparesis, depression and anxiety, OSA,   being admitted with intractable nausea with occasional vomiting, epigastric pain not improving with home meds.  He denies coffee-ground or blood in vomitus.  Has no melena or blood in stool.  Denies fever or chills or dysuria.  Patient was last hospitalized for the same in December 2023 when his symptoms were attributed to gastroparesis, improving with Reglan  and baclofen .  Last endoscopy on record was from August 2023 that showed esophagitis. ED course: Mildly tachycardic in the ED with BP in the 140s to 180s, low-grade temp as high as 99.4. Labs notable for WBC 11,000, glucose 241, lipase normal, LFTs with elevated bilirubin of 2.3 and normal transaminases. Troponin 51 and BNP 280 EKG with sinus tachycardia, chest x-ray nonacute, CT abdomen and right upper quadrant ultrasound showing cholelithiasis without acute cholecystitis. Patient treated with several antiemetics, famotidine  and IV fluid bolus but had persistent symptoms. Observation requested.  Review of Systems: As mentioned in the history of present illness. All other systems reviewed and are negative. Past Medical History:  Diagnosis Date   Diabetes mellitus without complication (HCC)    DKA, type 2 (HCC) 07/09/2022   Hypertension    Past Surgical History:  Procedure Laterality Date   COLONOSCOPY N/A 03/09/2022   Procedure: COLONOSCOPY;  Surgeon: Marnee Sink, MD;  Location: [facility] ENDOSCOPY;  Service: Endoscopy;  Laterality:  N/A;   ESOPHAGOGASTRODUODENOSCOPY N/A 03/09/2022   Procedure: ESOPHAGOGASTRODUODENOSCOPY (EGD);  Surgeon: Marnee Sink, MD;  Location: [facility] ENDOSCOPY;  Service: Endoscopy;  Laterality: N/A;   HERNIA REPAIR     Social History:  reports that he has never smoked. He has never used smokeless tobacco. He reports that he does not drink alcohol and does not use drugs.  Allergies  Allergen Reactions   Ace Inhibitors Cough    Family History  Problem Relation Age of Onset   Diabetes Mother    Hypertension Mother    Seizures Mother    Hypertension Father    Colon cancer Father     Prior to Admission medications   Medication Sig Start Date End Date Taking? Authorizing Provider  amLODipine  (NORVASC ) 10 MG tablet Take 1 tablet (10 mg total) by mouth daily. 02/20/23  Yes Simmons-Robinson, Makiera, MD  aspirin  EC 81 MG tablet Take 1 tablet (81 mg total) by mouth daily. Swallow whole. 09/05/22  Yes Drubel, Heidi Llamas, PA-C  atorvastatin  (LIPITOR) 40 MG tablet TAKE 1 TABLET BY MOUTH EVERY DAY 10/24/23  Yes Simmons-Robinson, Judyann Number, MD  calcium  carbonate (TUMS - DOSED IN MG ELEMENTAL CALCIUM ) 500 MG chewable tablet Chew 1 tablet (200 mg of elemental calcium  total) by mouth 3 (three) times daily with meals. Patient taking differently: Chew 200 mg of elemental calcium  by mouth 3 (three) times daily with meals. As needed 07/10/22  Yes Luna Salinas, MD  gabapentin  (NEURONTIN ) 300 MG capsule Take 1-2 capsules (300-600 mg total) by mouth 3 (three) times daily. 02/20/23  Yes Penumalli, Vikram R, MD  insulin  glargine (LANTUS  SOLOSTAR) 100 UNIT/ML Solostar Pen INJECT 75 UNITS INTO THE SKIN AT BEDTIME.  10/24/23  Yes Simmons-Robinson, Makiera, MD  losartan  (COZAAR ) 100 MG tablet Take 1 tablet (100 mg total) by mouth daily. 07/01/23  Yes Simmons-Robinson, Judyann Number, MD  metFORMIN  (GLUCOPHAGE -XR) 500 MG 24 hr tablet Take 500 mg by mouth 2 (two) times daily with a meal.   Yes [provider]  pantoprazole  (PROTONIX ) 20  MG tablet Take 1 tablet (20 mg total) by mouth daily. 06/12/23  Yes Simmons-Robinson, Makiera, MD  Blood Glucose Monitoring Suppl (ONETOUCH VERIO) w/Device KIT Use to check blood sugar before meals and fasting, as as needed 10/31/22   Drubel, Heidi Llamas, PA-C  CORLANOR 5 MG TABS tablet Take by mouth. Patient not taking: Reported on 01/11/2024 10/23/22   [provider]  glipiZIDE  (GLUCOTROL ) 10 MG tablet Take 1 tablet (10 mg total) by mouth 2 (two) times daily before a meal. Patient not taking: Reported on 01/11/2024 09/05/22   Trenton Frock, PA-C  glucose blood test strip Use as instructed 10/31/22   Drubel, Heidi Llamas, PA-C  Insulin  Pen Needle 29G X 12.7MM MISC 1 Needle by Does not apply route at bedtime. 09/19/22   Simmons-Robinson, Judyann Number, MD  metFORMIN  (GLUCOPHAGE ) 1000 MG tablet Take 1,000 mg by mouth. 01/01/24   [provider]  tadalafil  (CIALIS ) 20 MG tablet TAKE 1 TABLET BY MOUTH EVERY DAY AS NEEDED FOR ERECTILE DYSFUNCTION 05/13/23   Dustin Gimenez, MD    Physical Exam: Vitals:   01/11/24 2134 01/11/24 2245 01/11/24 2327 01/12/24 0000  BP:  (!) 174/108 131/88   Pulse:  92 85   Resp:  16    Temp: 99.4 F (37.4 C) 98.7 F (37.1 C)    TempSrc: Oral Oral    SpO2:  96%    Weight:    109.8 kg  Height:    5\' 6"  (1.676 m)   Physical Exam Vitals and nursing note reviewed.  Constitutional:      General: He is not in acute distress. HENT:     Head: Normocephalic and atraumatic.  Cardiovascular:     Rate and Rhythm: Normal rate and regular rhythm.     Heart sounds: Normal heart sounds.  Pulmonary:     Effort: Pulmonary effort is normal.     Breath sounds: Normal breath sounds.  Abdominal:     Palpations: Abdomen is soft.     Tenderness: There is no abdominal tenderness.  Neurological:     Mental Status: Mental status is at baseline.     Data Reviewed: Relevant notes from primary care and specialist visits, past discharge summaries as available in EHR, including Care  Everywhere. Prior diagnostic testing as pertinent to current admission diagnoses Updated medications and problem lists for reconciliation ED course, including vitals, labs, imaging, treatment and response to treatment Triage notes, nursing and pharmacy notes and ED provider's notes Notable results as noted in HPI   Assessment and Plan: * Intractable nausea and vomiting Suspect diabetic gastroparesis History of esophagitis on EGD 2023 History of H. pylori Ice chips only for now IV Protonix  Reglan  every 6h x2 , Zofran  as needed with Phenergan for breakthrough IV hydration GI consult for consideration of EGD  Belching Has used Reglan  and baclofen  in the past with improvement Continue IV Reglan  as above.  Can add baclofen  when tolerating p.o.   Hyperglycemia due to type 2 diabetes mellitus (HCC) Sliding scale insulin  coverage  Hypertension associated with diabetes (HCC) Continue amlodipine   Hyperlipidemia associated with type 2 diabetes mellitus (HCC) Continue atorvastatin   Sleep apnea CPAP as desired  Advance Care Planning:   Code Status: Full Code   Consults: GI  Family Communication: none  Severity of Illness: The appropriate patient status for this patient is OBSERVATION. Observation status is judged to be reasonable and necessary in order to provide the required intensity of service to ensure the patient's safety. The patient's presenting symptoms, physical exam findings, and initial radiographic and laboratory data in the context of their medical condition is felt to place them at decreased risk for further clinical deterioration. Furthermore, it is anticipated that the patient will be medically stable for discharge from the hospital within 2 midnights of admission.   Author: Lanetta Pion, MD 01/12/2024 2:59 AM  For on call review [email or web address].

## 2024-01-12 NOTE — Assessment & Plan Note (Signed)
 CPAP as desired

## 2024-01-13 ENCOUNTER — Observation Stay: Admitting: General Practice

## 2024-01-13 ENCOUNTER — Other Ambulatory Visit: Payer: Self-pay | Admitting: Student

## 2024-01-13 ENCOUNTER — Encounter
Admission: EM | Disposition: A | Discharge status: Home / Self Care | Payer: Self-pay | Source: Home / Self Care | Attending: Emergency Medicine

## 2024-01-13 ENCOUNTER — Encounter: Payer: Self-pay | Admitting: Internal Medicine

## 2024-01-13 DIAGNOSIS — I110 Hypertensive heart disease with heart failure: Secondary | ICD-10-CM | POA: Diagnosis not present

## 2024-01-13 DIAGNOSIS — K209 Esophagitis, unspecified without bleeding: Secondary | ICD-10-CM

## 2024-01-13 DIAGNOSIS — K2211 Ulcer of esophagus with bleeding: Secondary | ICD-10-CM | POA: Diagnosis not present

## 2024-01-13 DIAGNOSIS — B9681 Helicobacter pylori [H. pylori] as the cause of diseases classified elsewhere: Secondary | ICD-10-CM

## 2024-01-13 DIAGNOSIS — I509 Heart failure, unspecified: Secondary | ICD-10-CM | POA: Diagnosis not present

## 2024-01-13 DIAGNOSIS — R112 Nausea with vomiting, unspecified: Secondary | ICD-10-CM | POA: Diagnosis not present

## 2024-01-13 HISTORY — PX: ESOPHAGOGASTRODUODENOSCOPY: SHX5428

## 2024-01-13 LAB — GLUCOSE, CAPILLARY
Glucose-Capillary: 101 mg/dL — ABNORMAL HIGH (ref 70–99)
Glucose-Capillary: 104 mg/dL — ABNORMAL HIGH (ref 70–99)
Glucose-Capillary: 193 mg/dL — ABNORMAL HIGH (ref 70–99)
Glucose-Capillary: 92 mg/dL (ref 70–99)
Glucose-Capillary: 96 mg/dL (ref 70–99)

## 2024-01-13 LAB — CBC
HCT: 44.9 % (ref 39.0–52.0)
Hemoglobin: 15.3 g/dL (ref 13.0–17.0)
MCH: 32.6 pg (ref 26.0–34.0)
MCHC: 34.1 g/dL (ref 30.0–36.0)
MCV: 95.5 fL (ref 80.0–100.0)
Platelets: 286 10*3/uL (ref 150–400)
RBC: 4.7 MIL/uL (ref 4.22–5.81)
RDW: 11.9 % (ref 11.5–15.5)
WBC: 10 10*3/uL (ref 4.0–10.5)
nRBC: 0 % (ref 0.0–0.2)

## 2024-01-13 LAB — PHOSPHORUS: Phosphorus: 2.7 mg/dL (ref 2.5–4.6)

## 2024-01-13 LAB — BASIC METABOLIC PANEL WITH GFR
Anion gap: 7 (ref 5–15)
BUN: 11 mg/dL (ref 8–23)
CO2: 30 mmol/L (ref 22–32)
Calcium: 8 mg/dL — ABNORMAL LOW (ref 8.9–10.3)
Chloride: 100 mmol/L (ref 98–111)
Creatinine, Ser: 1.01 mg/dL (ref 0.61–1.24)
GFR, Estimated: >60 mL/min (ref >60)
Glucose, Bld: 160 mg/dL — ABNORMAL HIGH (ref 70–99)
Potassium: 3.6 mmol/L (ref 3.5–5.1)
Sodium: 137 mmol/L (ref 135–145)

## 2024-01-13 LAB — HEPATIC FUNCTION PANEL
ALT: 18 U/L (ref 0–44)
AST: 25 U/L (ref 15–41)
Albumin: 3 g/dL — ABNORMAL LOW (ref 3.5–5.0)
Alkaline Phosphatase: 74 U/L (ref 38–126)
Bilirubin, Direct: 0.4 mg/dL — ABNORMAL HIGH (ref 0.0–0.2)
Indirect Bilirubin: 2.7 mg/dL — ABNORMAL HIGH (ref 0.3–0.9)
Total Bilirubin: 3.1 mg/dL — ABNORMAL HIGH (ref 0.0–1.2)
Total Protein: 6.2 g/dL — ABNORMAL LOW (ref 6.5–8.1)

## 2024-01-13 LAB — MAGNESIUM: Magnesium: 1.9 mg/dL (ref 1.7–2.4)

## 2024-01-13 LAB — HEMOGLOBIN A1C
Hgb A1c MFr Bld: 7.4 % — ABNORMAL HIGH (ref 4.8–5.6)
Mean Plasma Glucose: 166 mg/dL

## 2024-01-13 LAB — VITAMIN D 25 HYDROXY (VIT D DEFICIENCY, FRACTURES): Vit D, 25-Hydroxy: 19.34 ng/mL — ABNORMAL LOW (ref 30–100)

## 2024-01-13 SURGERY — EGD (ESOPHAGOGASTRODUODENOSCOPY)
Anesthesia: General

## 2024-01-13 MED ORDER — LIDOCAINE HCL (CARDIAC) PF 100 MG/5ML IV SOSY
PREFILLED_SYRINGE | INTRAVENOUS | Status: DC | PRN
  Administered 2024-01-13: 100 mg via INTRAVENOUS

## 2024-01-13 MED ORDER — SODIUM CHLORIDE 0.9 % IV SOLN: INTRAVENOUS | Status: DC

## 2024-01-13 MED ORDER — PANTOPRAZOLE SODIUM 40 MG PO TBEC: 80.0000 mg | DELAYED_RELEASE_TABLET | Freq: Two times a day (BID) | ORAL | Status: DC

## 2024-01-13 MED ORDER — PANTOPRAZOLE SODIUM 40 MG PO TBEC: DELAYED_RELEASE_TABLET | ORAL | 0 refills | Status: DC

## 2024-01-13 MED ORDER — PROPOFOL 500 MG/50ML IV EMUL
INTRAVENOUS | Status: DC | PRN
  Administered 2024-01-13: 80 mg via INTRAVENOUS
  Administered 2024-01-13: 125 ug/kg/min via INTRAVENOUS

## 2024-01-13 MED ORDER — PROPOFOL 10 MG/ML IV BOLUS
INTRAVENOUS | Status: AC
  Filled 2024-01-13: qty 40

## 2024-01-13 MED ORDER — LIDOCAINE HCL (PF) 2 % IJ SOLN
INTRAMUSCULAR | Status: AC
  Filled 2024-01-13: qty 5

## 2024-01-13 MED ORDER — PANTOPRAZOLE SODIUM 40 MG IV SOLR
40.0000 mg | Freq: Once | INTRAVENOUS | Status: AC
  Administered 2024-01-13: 40 mg via INTRAVENOUS
  Filled 2024-01-13: qty 10

## 2024-01-13 NOTE — Plan of Care (Signed)
  Problem: Clinical Measurements: Goal: Ability to maintain clinical measurements within normal limits will improve Outcome: Progressing Goal: Will remain free from infection Outcome: Progressing Goal: Diagnostic test results will improve Outcome: Progressing Goal: Respiratory complications will improve Outcome: Progressing Goal: Cardiovascular complication will be avoided Outcome: Progressing   Problem: Activity: Goal: Risk for activity intolerance will decrease Outcome: Progressing   Problem: Nutrition: Goal: Adequate nutrition will be maintained Outcome: Progressing   Problem: Coping: Goal: Level of anxiety will decrease Outcome: Progressing   Problem: Elimination: Goal: Will not experience complications related to bowel motility Outcome: Progressing Goal: Will not experience complications related to urinary retention Outcome: Progressing   Problem: Pain Managment: Goal: General experience of comfort will improve and/or be controlled Outcome: Progressing   Problem: Safety: Goal: Ability to remain free from injury will improve Outcome: Progressing   Problem: Skin Integrity: Goal: Risk for impaired skin integrity will decrease Outcome: Progressing   Problem: Nutritional: Goal: Maintenance of adequate nutrition will improve Outcome: Progressing Goal: Progress toward achieving an optimal weight will improve Outcome: Progressing   Problem: Skin Integrity: Goal: Risk for impaired skin integrity will decrease Outcome: Progressing   Problem: Tissue Perfusion: Goal: Adequacy of tissue perfusion will improve Outcome: Progressing

## 2024-01-13 NOTE — Discharge Summary (Signed)
 [facility] Discharge Summary   Patient: Corey Petersen JJO:[id number]  PCP: Mimi Alt, MD  Date of admission: 01/11/2024   Date of discharge:  01/13/2024     Discharge Diagnoses:  Principal Problem:   Intractable nausea and vomiting Active Problems:   Belching   Hyperglycemia due to type 2 diabetes mellitus (HCC)   Hypertension associated with diabetes (HCC)   Hyperlipidemia associated with type 2 diabetes mellitus (HCC)   Sleep apnea   Acute esophagitis   Admitted From: Home Disposition:  Home   Recommendations for Outpatient Follow-up:  Follow-up with PCP in 1 week,  Follow-up with GI to repeat EGD in 6 weeks, continue high-dose PPI in the meantime. Follow up LABS/TEST: repeat TSH level and free T4 level after 4 weeks and vitamin D level after 3 to 6 months   Follow-up Information     Simmons-Robinson, Makiera, MD Follow up in 1 week(s).   Specialty: Family Medicine Contact information: [address] [place] [address] [phone]         Cassie Click, MD Follow up in 1 month(s).   Specialty: Gastroenterology Contact information: [address] [place] [address] [phone]                Diet recommendation: Carb modified diet  Activity: The patient is advised to gradually reintroduce usual activities, as tolerated  Discharge Condition: stable  Code Status: Full code   History of present illness: As per the H and P dictated on admission Hospital Course:  Isom Kochan is a 63 y.o. male with medical history significant of insulin -dependent diabetes mellitus, hypertension, esophagitis and prior H. pylori, gastroparesis, depression and anxiety, OSA,   being admitted with intractable nausea with occasional vomiting, epigastric pain not improving with home meds.  He denies coffee-ground or blood in vomitus.  Has no melena or blood in stool.  Denies fever or chills or dysuria.  Patient was last  hospitalized for the same in December 2023 when his symptoms were attributed to gastroparesis, improving with Reglan  and baclofen .  Last endoscopy on record was from August 2023 that showed esophagitis. ED course: Mildly tachycardic in the ED with BP in the 140s to 180s, low-grade temp as high as 99.4. Labs notable for WBC 11,000, glucose 241, lipase normal, LFTs with elevated bilirubin of 2.3 and normal transaminases. Troponin 51 and BNP 280 EKG with sinus tachycardia, chest x-ray nonacute, CT abdomen and right upper quadrant ultrasound showing cholelithiasis without acute cholecystitis. Patient treated with several antiemetics, famotidine  and IV fluid bolus but had persistent symptoms. Observation requested.    Assessment and Plan:  # Intractable nausea and vomiting, Resolved  History of esophagitis on EGD 2023 History of H. pylori S/p IV Protonix , Reglan  every 6h x2 , Zofran  as needed with Phenergan for breakthrough, s/p IV hydration GI consulted, s/p TDV:VOHYWV esophagitis halfway up the esophagus without any abnormalities in the stomach or small bowel. The patient should be treated with a double dose PPI with a repeat EGD in 6 weeks.   # Belching due to severe esophagitis.  Resolved with symptomatic treatment.  Patient is tolerating diet well.  # Hyperglycemia due to type 2 diabetes mellitus S/p Sliding scale insulin  coverage.  Resumed home dose insulin  and metformin .  Advised to continue diabetic diet and monitor CBG at home. # Hypertension: Continue amlodipine  # Hyperlipidemia:Continue atorvastatin  # Sleep apnea: CPAP as desired # Vitamin D deficiency: started vitamin D 50,000 units p.o. weekly, follow with PCP to  repeat vitamin D level after 3 to 6 months. # Elevated free T4 level.  Follow with PCP to repeat TSH and free T4 level after 4 weeks.   Body mass index is 39.06 kg/m.  Nutrition Interventions:  - Patient was instructed, not to drive, operate heavy machinery, perform  activities at heights, swimming or participation in water activities or provide baby sitting services while on Pain, Sleep and Anxiety Medications; until his outpatient Physician has advised to do so again.  - Also recommended to not to take more than prescribed Pain, Sleep and Anxiety Medications.   Patient was ambulatory without any assistance. On the day of the discharge the patient's vitals were stable, and no other acute medical condition were reported by patient. the patient was felt safe to be discharge at Home.  Consultants: GI Procedures: EGD: Severe esophagitis as above  Discharge Exam: General: Appear in no distress, no Rash; Oral Mucosa Clear, moist. Cardiovascular: S1 and S2 Present, no Murmur, Respiratory: normal respiratory effort, Bilateral Air entry present and no Crackles, no wheezes Abdomen: Bowel Sound present, Soft and no tenderness, no hernia Extremities: no Pedal edema, no calf tenderness Neurology: alert and oriented to time, place, and person affect appropriate.  Filed Weights   01/12/24 0000  Weight: 109.8 kg   Vitals:   01/13/24 1144 01/13/24 1602  BP: (!) 150/93 (!) 142/63  Pulse: 77 86  Resp: 15 18  Temp:  98.2 F (36.8 C)  SpO2: 100% 96%    DISCHARGE MEDICATION: Allergies as of 01/13/2024       Reactions   Ace Inhibitors Cough        Medication List     STOP taking these medications    Corlanor 5 MG Tabs tablet Generic drug: ivabradine    glipiZIDE  10 MG tablet Commonly known as: GLUCOTROL        TAKE these medications    amLODipine  10 MG tablet Commonly known as: NORVASC  Take 1 tablet (10 mg total) by mouth daily.   aspirin  EC 81 MG tablet Take 1 tablet (81 mg total) by mouth daily. Swallow whole.   atorvastatin  40 MG tablet Commonly known as: LIPITOR TAKE 1 TABLET BY MOUTH EVERY DAY   calcium  carbonate 500 MG chewable tablet Commonly known as: TUMS - dosed in mg elemental calcium  Chew 1 tablet (200 mg of elemental  calcium  total) by mouth 3 (three) times daily with meals. What changed: additional instructions   gabapentin  300 MG capsule Commonly known as: NEURONTIN  Take 1-2 capsules (300-600 mg total) by mouth 3 (three) times daily.   glucose blood test strip Use as instructed   Insulin  Pen Needle 29G X 12.7MM Misc 1 Needle by Does not apply route at bedtime.   Lantus  SoloStar 100 UNIT/ML Solostar Pen Generic drug: insulin  glargine INJECT 75 UNITS INTO THE SKIN AT BEDTIME.   losartan  100 MG tablet Commonly known as: COZAAR  Take 1 tablet (100 mg total) by mouth daily.   metFORMIN  500 MG 24 hr tablet Commonly known as: GLUCOPHAGE -XR Take 500 mg by mouth 2 (two) times daily with a meal.   metFORMIN  1000 MG tablet Commonly known as: GLUCOPHAGE  Take 1,000 mg by mouth.   OneTouch Verio w/Device Kit Use to check blood sugar before meals and fasting, as as needed   pantoprazole  40 MG tablet Commonly known as: PROTONIX  Take 2 tablets (80 mg total) by mouth 2 (two) times daily for 60 days, THEN 1 tablet (40 mg total) 2 (two) times daily. Start taking on:  January 13, 2024 What changed:  medication strength See the new instructions.   tadalafil  20 MG tablet Commonly known as: CIALIS  TAKE 1 TABLET BY MOUTH EVERY DAY AS NEEDED FOR ERECTILE DYSFUNCTION       Allergies  Allergen Reactions   Ace Inhibitors Cough   Discharge Instructions     Call MD for:   Complete by: As directed    Intractable nausea or vomiting   Call MD for:  difficulty breathing, headache or visual disturbances   Complete by: As directed    Call MD for:  extreme fatigue   Complete by: As directed    Call MD for:  persistant dizziness or light-headedness   Complete by: As directed    Call MD for:  persistant nausea and vomiting   Complete by: As directed    Call MD for:  severe uncontrolled pain   Complete by: As directed    Diet - low sodium heart healthy   Complete by: As directed    Discharge instructions    Complete by: As directed    Follow-up with PCP in 1 week, repeat TSH level and free T4 level after 4 weeks Follow-up with GI to repeat EGD in 6 weeks, continue high-dose PPI in the meantime.   Increase activity slowly   Complete by: As directed        The results of significant diagnostics from this hospitalization (including imaging, microbiology, ancillary and laboratory) are listed below for reference.    Significant Diagnostic Studies: US  Abdomen Limited RUQ (LIVER/GB) Result Date: 01/11/2024 CLINICAL DATA:  Nausea and vomiting, cholelithiasis EXAM: ULTRASOUND ABDOMEN LIMITED RIGHT UPPER QUADRANT COMPARISON:  01/11/2024 FINDINGS: Gallbladder: Multiple shadowing gallstones layer dependently within the gallbladder. No gallbladder wall thickening or pericholecystic fluid. Negative sonographic Murphy sign. Common bile duct: Diameter: 3 mm Liver: No focal lesion identified. Within normal limits in parenchymal echogenicity. Portal vein is patent on color Doppler imaging with normal direction of blood flow towards the liver. Other: None. IMPRESSION: 1. Cholelithiasis without evidence of acute cholecystitis. Electronically Signed   By: Bobbye Burrow M.D.   On: 01/11/2024 18:48   CT ABDOMEN PELVIS W CONTRAST Result Date: 01/11/2024 CLINICAL DATA:  Abdominal pain, acute, nonlocalized EXAM: CT ABDOMEN AND PELVIS WITH CONTRAST TECHNIQUE: Multidetector CT imaging of the abdomen and pelvis was performed using the standard protocol following bolus administration of intravenous contrast. RADIATION DOSE REDUCTION: This exam was performed according to the departmental dose-optimization program which includes automated exposure control, adjustment of the mA and/or kV according to patient size and/or use of iterative reconstruction technique. CONTRAST:  OMNIPAQUE  IOHEXOL  300 MG/ML  SOLN COMPARISON:  07/08/2022 FINDINGS: Lower chest: No acute findings Hepatobiliary: Layering gallstones within the gallbladder.  No biliary ductal dilatation or focal hepatic abnormality. Pancreas: No focal abnormality or ductal dilatation. Spleen: No focal abnormality.  Normal size. Adrenals/Urinary Tract: Adrenal glands normal. 9 cm cyst in the midpole of the left kidney is stable since prior study. No follow-up imaging recommended. No stones or hydronephrosis. Urinary bladder unremarkable. Stomach/Bowel: Stomach, large and small bowel grossly unremarkable. Normal appendix. Vascular/Lymphatic: No evidence of aneurysm or adenopathy. Reproductive: No visible focal abnormality. Other: No free fluid or free air. Musculoskeletal: No acute bony abnormality. IMPRESSION: Cholelithiasis.  No CT evidence of acute cholecystitis. No acute findings. Electronically Signed   By: Janeece Mechanic M.D.   On: 01/11/2024 17:50   DG Chest 2 View Result Date: 01/11/2024 CLINICAL DATA:  Chest pain. EXAM: CHEST - 2 VIEW  COMPARISON:  Chest radiograph dated 07/08/2022. FINDINGS: The heart size and mediastinal contours are within normal limits. Both lungs are clear. The visualized skeletal structures are unremarkable. IMPRESSION: No active cardiopulmonary disease. Electronically Signed   By: Angus Bark M.D.   On: 01/11/2024 14:44    Microbiology: No results found for this or any previous visit (from the past 240 hours).   Labs: CBC: Recent Labs  Lab 01/11/24 1409 01/12/24 1007 01/13/24 1451  WBC 11.2* 8.8 10.0  HGB 16.3 15.1 15.3  HCT 46.7 44.6 44.9  MCV 94.5 96.3 95.5  PLT 338 232 286   Basic Metabolic Panel: Recent Labs  Lab 01/11/24 1409 01/12/24 1007 01/13/24 1451  NA 134* 137 137  K 3.9 3.5 3.6  CL 98 99 100  CO2 24 26 30   GLUCOSE 241* 122* 160*  BUN 16 18 11   CREATININE 1.16 1.04 1.01  CALCIUM  9.0 8.2* 8.0*  MG  --   --  1.9  PHOS  --   --  2.7   Liver Function Tests: Recent Labs  Lab 01/11/24 1649 01/13/24 1451  AST 20 25  ALT 18 18  ALKPHOS 78 74  BILITOT 2.3* 3.1*  PROT 6.4* 6.2*  ALBUMIN 3.1* 3.0*    Recent Labs  Lab 01/11/24 1409  LIPASE 26   No results for input(s): "AMMONIA" in the last 168 hours. Cardiac Enzymes: No results for input(s): "CKTOTAL", "CKMB", "CKMBINDEX", "TROPONINI" in the last 168 hours. BNP (last 3 results) Recent Labs    01/11/24 1409  BNP 280.1*   CBG: Recent Labs  Lab 01/13/24 0002 01/13/24 0344 01/13/24 0821 01/13/24 1243 01/13/24 1603  GLUCAP 96 92 101* 104* 193*    Time spent: 35 minutes  Signed:  Althia Atlas  [facility] 01/13/2024 4:21 PM

## 2024-01-13 NOTE — Anesthesia Preprocedure Evaluation (Signed)
 Anesthesia Evaluation  Patient identified by MRN, date of birth, ID band Patient awake    Reviewed: Allergy & Precautions, NPO status , Patient's Chart, lab work & pertinent test results  Airway Mallampati: II  TM Distance: >3 FB Neck ROM: Full    Dental  (+) Teeth Intact   Pulmonary neg pulmonary ROS, sleep apnea    Pulmonary exam normal  + decreased breath sounds      Cardiovascular Exercise Tolerance: Good hypertension, Pt. on medications +CHF  Normal cardiovascular exam Rhythm:Regular     Neuro/Psych negative neurological ROS  negative psych ROS   GI/Hepatic negative GI ROS, Neg liver ROS,,,  Endo/Other  negative endocrine ROSdiabetes, Well Controlled, Type 1  Class 3 obesity  Renal/GU      Musculoskeletal   Abdominal   Peds  Hematology negative hematology ROS (+)   Anesthesia Other Findings Past Medical History: No date: Diabetes mellitus without complication (HCC) No date: Hypertension  Past Surgical History: No date: HERNIA REPAIR  BMI    Body Mass Index: 33.67 kg/m      Reproductive/Obstetrics negative OB ROS                             Anesthesia Physical Anesthesia Plan  ASA: 3  Anesthesia Plan: General   Post-op Pain Management: Minimal or no pain anticipated   Induction: Intravenous  PONV Risk Score and Plan: 3 and Propofol  infusion, TIVA and Ondansetron   Airway Management Planned: Nasal Cannula  Additional Equipment: None  Intra-op Plan:   Post-operative Plan:   Informed Consent: I have reviewed the patients History and Physical, chart, labs and discussed the procedure including the risks, benefits and alternatives for the proposed anesthesia with the patient or authorized representative who has indicated his/her understanding and acceptance.     Dental advisory given  Plan Discussed with: CRNA and Surgeon  Anesthesia Plan Comments: (Discussed  risks of anesthesia with patient, including possibility of difficulty with spontaneous ventilation under anesthesia necessitating airway intervention, PONV, and rare risks such as cardiac or respiratory or neurological events, and allergic reactions. Discussed the role of CRNA in patient's perioperative care. Patient understands.)       Anesthesia Quick Evaluation

## 2024-01-13 NOTE — Anesthesia Postprocedure Evaluation (Signed)
 Anesthesia Post Note  Patient: [name]  Procedure(s) Performed: EGD (ESOPHAGOGASTRODUODENOSCOPY)  Patient location during evaluation: Endoscopy Anesthesia Type: General Level of consciousness: awake and alert Pain management: pain level controlled Vital Signs Assessment: post-procedure vital signs reviewed and stable Respiratory status: spontaneous breathing, nonlabored ventilation, respiratory function stable and patient connected to nasal cannula oxygen Cardiovascular status: blood pressure returned to baseline and stable Postop Assessment: no apparent nausea or vomiting Anesthetic complications: no  No notable events documented.   Last Vitals:  Vitals:   01/13/24 1109 01/13/24 1123  BP: 106/68 120/71  Pulse: 74 65  Resp: (!) 24 17  Temp: (!) 35.6 C   SpO2: 100% 99%    Last Pain:  Vitals:   01/13/24 1123  TempSrc:   PainSc: 0-No pain                 Enrique Harvest

## 2024-01-13 NOTE — Progress Notes (Signed)
 The patient had an EGD today that showed severe esophagitis halfway up the esophagus without any abnormalities in the stomach or small bowel. The patient should be treated with a double dose PPI with a repeat EGD in 6 weeks. Nothing further to do from a GI point of view at this time.  I think further do from a GI point of view.  I will sign off.  Please call if any further GI concerns or questions.  We would like to thank you for the opportunity to participate in the care of [facility].

## 2024-01-13 NOTE — Op Note (Signed)
 [facility] Gastroenterology Patient Name: Corey Petersen Procedure Date: 01/13/2024 10:44 AM MRN: [id number] Account #: [id number] Date of Birth: [birth year] Admit Type: Inpatient Age: 63 Room: [facility] ENDO ROOM 4 Gender: Male Note Status: Finalized Instrument Name: Upper Endoscope [id number] Procedure:             Upper GI endoscopy Indications:           Hematemesis Providers:             Marnee Sink MD, MD Referring MD:          Dori Garbe (Referring MD) Medicines:             Propofol  per Anesthesia Complications:         No immediate complications. Procedure:             Pre-Anesthesia Assessment:                        - Prior to the procedure, a History and Physical was                         performed, and patient medications and allergies were                         reviewed. The patient's tolerance of previous                         anesthesia was also reviewed. The risks and benefits                         of the procedure and the sedation options and risks                         were discussed with the patient. All questions were                         answered, and informed consent was obtained. Prior                         Anticoagulants: The patient has taken no anticoagulant                         or antiplatelet agents. ASA Grade Assessment: II - A                         patient with mild systemic disease. After reviewing                         the risks and benefits, the patient was deemed in                         satisfactory condition to undergo the procedure.                        After obtaining informed consent, the endoscope was                         passed under direct vision. Throughout the procedure,  the patient's blood pressure, pulse, and oxygen                         saturations were monitored continuously. The Endoscope                         was introduced through the mouth, and advanced to the                          second part of duodenum. The upper GI endoscopy was                         accomplished without difficulty. The patient tolerated                         the procedure well. Findings:      LA Grade D (one or more mucosal breaks involving at least 75% of       esophageal circumference) esophagitis with no bleeding was found 30 to       40 cm from the incisors. Biopsies were taken with a cold forceps for       histology.      The stomach was normal.      The examined duodenum was normal. Impression:            - LA Grade D reflux esophagitis with no bleeding.                         Biopsied.                        - Normal stomach.                        - Normal examined duodenum. Recommendation:        - Return patient to hospital ward for ongoing care.                        - Resume previous diet.                        - Continue present medications.                        - Use a proton pump inhibitor PO BID for 6 weeks.                        - Repeat upper endoscopy in 6 weeks to check healing. Procedure Code(s):     --- Professional ---                        [phone], Esophagogastroduodenoscopy, flexible,                         transoral; with biopsy, single or multiple Diagnosis Code(s):     --- Professional ---                        K92.0, Hematemesis  K21.00, Gastro-esophageal reflux disease with                         esophagitis, without bleeding CPT copyright 2022 American Medical Association. All rights reserved. The codes documented in this report are preliminary and upon coder review may  be revised to meet current compliance requirements. Marnee Sink MD, MD 01/13/2024 11:07:00 AM This report has been signed electronically. Number of Addenda: 0 Note Initiated On: 01/13/2024 10:44 AM Estimated Blood Loss:  Estimated blood loss: none.      [facility]

## 2024-01-13 NOTE — Care Management Obs Status (Signed)
 MEDICARE OBSERVATION STATUS NOTIFICATION   Patient Details  Name: Corey Petersen MRN: [id number] Date of Birth: 22-May-1961   Medicare Observation Status Notification Given:  Yes    Anise Kerns 01/13/2024, 1:54 PM

## 2024-01-13 NOTE — Transfer of Care (Signed)
 Immediate Anesthesia Transfer of Care Note  Patient: Corey Petersen  Procedure(s) Performed: EGD (ESOPHAGOGASTRODUODENOSCOPY)  Patient Location: PACU  Anesthesia Type:MAC  Level of Consciousness: drowsy  Airway & Oxygen Therapy: Patient Spontanous Breathing  Post-op Assessment: Report given to RN and Post -op Vital signs reviewed and stable  Post vital signs: stable  Last Vitals:  Vitals Value Taken Time  BP 106/68 01/13/24 1109  Temp 35.6 C 01/13/24 1109  Pulse 74 01/13/24 1109  Resp 24 01/13/24 1109  SpO2 100 % 01/13/24 1109  Vitals shown include unfiled device data.  Last Pain:  Vitals:   01/13/24 1109  TempSrc: Temporal  PainSc: Asleep         Complications: No notable events documented.

## 2024-01-14 ENCOUNTER — Other Ambulatory Visit: Payer: Self-pay | Admitting: Student

## 2024-01-14 ENCOUNTER — Ambulatory Visit: Payer: Self-pay | Admitting: Gastroenterology

## 2024-01-14 ENCOUNTER — Telehealth: Payer: Self-pay

## 2024-01-14 ENCOUNTER — Encounter: Payer: Self-pay | Admitting: Gastroenterology

## 2024-01-14 LAB — SURGICAL PATHOLOGY

## 2024-01-14 MED ORDER — VITAMIN D (ERGOCALCIFEROL) 1.25 MG (50000 UNIT) PO CAPS: 50000.0000 [IU] | ORAL_CAPSULE | ORAL | 0 refills | Status: AC

## 2024-01-14 NOTE — Transitions of Care (Post Inpatient/ED Visit) (Signed)
 01/14/2024  Name: Corey Petersen MRN: [id number] DOB: 27-Sep-1960  Today's TOC FU Call Status: Today's TOC FU Call Status:: Successful TOC FU Call Completed TOC FU Call Complete Date: 01/14/24 Patient's Name and Date of Birth confirmed.  Transition Care Management Follow-up Telephone Call Date of Discharge: 01/13/24 Discharge Facility: [facility]) Type of Discharge: Inpatient Admission Primary Inpatient Discharge Diagnosis:: chest pain How have you been since you were released from the hospital?: Better Any questions or concerns?: No  Items Reviewed: Did you receive and understand the discharge instructions provided?: Yes Medications obtained,verified, and reconciled?: Yes (Medications Reviewed) Any new allergies since your discharge?: No Dietary orders reviewed?: Yes Do you have support at home?: Yes People in Home [RPT]: spouse  Medications Reviewed Today: Medications Reviewed Today     Reviewed by Darrall Ellison, LPN (Licensed Practical Nurse) on 01/14/24 at 1054  Med List Status: <None>   Medication Order Taking? Sig Documenting Provider Last Dose Status Informant  amLODipine  (NORVASC ) 10 MG tablet [id number] No Take 1 tablet (10 mg total) by mouth daily. Simmons-Robinson, Judyann Number, MD 01/10/2024 Active Self  aspirin  EC 81 MG tablet [id number] No Take 1 tablet (81 mg total) by mouth daily. Swallow whole. Trenton Frock, PA-C Past Week Active Self  atorvastatin  (LIPITOR) 40 MG tablet [id number] No TAKE 1 TABLET BY MOUTH EVERY DAY Simmons-Robinson, Makiera, MD 01/10/2024 Active Self  Blood Glucose Monitoring Suppl (ONETOUCH VERIO) w/Device KIT [id number] No Use to check blood sugar before meals and fasting, as as needed Drubel, Heidi Llamas, PA-C Taking Active   calcium  carbonate (TUMS - DOSED IN MG ELEMENTAL CALCIUM ) 500 MG chewable tablet [id number] No Chew 1 tablet (200 mg of elemental calcium  total) by mouth 3 (three) times daily with meals.  Patient taking  differently: Chew 200 mg of elemental calcium  by mouth 3 (three) times daily with meals. As needed   Amin, Sumayya, MD 01/11/2024 Active Self  gabapentin  (NEURONTIN ) 300 MG capsule [id number] No Take 1-2 capsules (300-600 mg total) by mouth 3 (three) times daily. Omega Bible, MD 01/10/2024 Active Self  glucose blood test strip [id number] No Use as instructed Trenton Frock, PA-C Taking Active   insulin  glargine (LANTUS  SOLOSTAR) 100 UNIT/ML Solostar Pen [id number] No INJECT 75 UNITS INTO THE SKIN AT BEDTIME. Mimi Alt, MD 01/10/2024 Bedtime Active Self  Insulin  Pen Needle 29G X 12.7MM MISC [id number] No 1 Needle by Does not apply route at bedtime. Simmons-Robinson, Makiera, MD Taking Active   losartan  (COZAAR ) 100 MG tablet [id number] No Take 1 tablet (100 mg total) by mouth daily. Simmons-Robinson, Judyann Number, MD 01/10/2024 Active Self  metFORMIN  (GLUCOPHAGE ) 1000 MG tablet [id number]  Take 1,000 mg by mouth. [provider]  Active   metFORMIN  (GLUCOPHAGE -XR) 500 MG 24 hr tablet [id number] No Take 500 mg by mouth 2 (two) times daily with a meal. [provider] 01/10/2024 Active Self  pantoprazole  (PROTONIX ) 40 MG tablet [id number]  Take 2 tablets (80 mg total) by mouth 2 (two) times daily for 60 days, THEN 1 tablet (40 mg total) 2 (two) times daily. Althia Atlas, MD  Active   tadalafil  (CIALIS ) 20 MG tablet [id number] No TAKE 1 TABLET BY MOUTH EVERY DAY AS NEEDED FOR ERECTILE DYSFUNCTION Dustin Gimenez, MD Taking Active             Home Care and Equipment/Supplies: Were Home Health Services Ordered?: NA Any new equipment or medical supplies ordered?: NA  Functional Questionnaire: Do you need assistance with bathing/showering or dressing?: No Do  you need assistance with meal preparation?: No Do you need assistance with eating?: No Do you have difficulty maintaining continence: No Do you need assistance with getting out of bed/getting out of a chair/moving?: No Do  you have difficulty managing or taking your medications?: No  Follow up appointments reviewed: PCP Follow-up appointment confirmed?: No (declined appt) MD Provider Line Number:[phone] Given: No Specialist Hospital Follow-up appointment confirmed?: No Reason Specialist Follow-Up Not Confirmed: Patient has Specialist Provider Number and will Call for Appointment Do you need transportation to your follow-up appointment?: No Do you understand care options if your condition(s) worsen?: Yes-patient verbalized understanding    SIGNATURE Darrall Ellison, LPN [facility] Nurse Health Advisor Direct Dial [phone]

## 2024-01-15 ENCOUNTER — Telehealth: Payer: Self-pay | Admitting: Gastroenterology

## 2024-01-15 NOTE — Telephone Encounter (Signed)
 Pt requesting call back to discuss prior procedure and medication

## 2024-01-16 ENCOUNTER — Other Ambulatory Visit: Payer: Self-pay

## 2024-01-16 DIAGNOSIS — K209 Esophagitis, unspecified without bleeding: Secondary | ICD-10-CM

## 2024-01-16 NOTE — Telephone Encounter (Signed)
 Pt scheduled for repeat EGD

## 2024-01-22 ENCOUNTER — Other Ambulatory Visit: Payer: Self-pay | Admitting: Family Medicine

## 2024-01-23 NOTE — Telephone Encounter (Signed)
 Requested Prescriptions  Pending Prescriptions Disp Refills   atorvastatin  (LIPITOR) 40 MG tablet [Pharmacy Med Name: ATORVASTATIN  40 MG TABLET] 90 tablet 1    Sig: TAKE 1 TABLET BY MOUTH EVERY DAY     Cardiovascular:  Antilipid - Statins Failed - 01/23/2024  5:51 PM      Failed - Valid encounter within last 12 months    Recent Outpatient Visits   None            Failed - Lipid Panel in normal range within the last 12 months    Cholesterol, Total  Date Value Ref Range Status  07/01/2023 158 100 - 199 mg/dL Final   LDL Chol Calc (NIH)  Date Value Ref Range Status  07/01/2023 72 0 - 99 mg/dL Final   HDL  Date Value Ref Range Status  07/01/2023 75 >39 mg/dL Final   Triglycerides  Date Value Ref Range Status  07/01/2023 50 0 - 149 mg/dL Final         Passed - Patient is not pregnant

## 2024-01-30 ENCOUNTER — Other Ambulatory Visit: Payer: Self-pay | Admitting: Family Medicine

## 2024-02-24 ENCOUNTER — Other Ambulatory Visit: Payer: Self-pay

## 2024-02-24 ENCOUNTER — Encounter: Payer: Self-pay | Admitting: Gastroenterology

## 2024-02-24 ENCOUNTER — Encounter
Admission: RE | Disposition: A | Discharge status: Home / Self Care | Payer: Self-pay | Source: Home / Self Care | Attending: Gastroenterology

## 2024-02-24 ENCOUNTER — Ambulatory Visit
Admission: RE | Admit: 2024-02-24 | Discharge: 2024-02-24 | Disposition: A | Discharge status: Home / Self Care | Attending: Gastroenterology | Admitting: Gastroenterology

## 2024-02-24 ENCOUNTER — Ambulatory Visit: Admitting: Anesthesiology

## 2024-02-24 DIAGNOSIS — Z794 Long term (current) use of insulin: Secondary | ICD-10-CM | POA: Insufficient documentation

## 2024-02-24 DIAGNOSIS — I509 Heart failure, unspecified: Secondary | ICD-10-CM | POA: Insufficient documentation

## 2024-02-24 DIAGNOSIS — I110 Hypertensive heart disease with heart failure: Secondary | ICD-10-CM | POA: Diagnosis not present

## 2024-02-24 DIAGNOSIS — Z7984 Long term (current) use of oral hypoglycemic drugs: Secondary | ICD-10-CM | POA: Insufficient documentation

## 2024-02-24 DIAGNOSIS — K209 Esophagitis, unspecified without bleeding: Secondary | ICD-10-CM | POA: Diagnosis not present

## 2024-02-24 DIAGNOSIS — E119 Type 2 diabetes mellitus without complications: Secondary | ICD-10-CM | POA: Insufficient documentation

## 2024-02-24 HISTORY — PX: ESOPHAGOGASTRODUODENOSCOPY: SHX5428

## 2024-02-24 LAB — GLUCOSE, CAPILLARY: Glucose-Capillary: 258 mg/dL — ABNORMAL HIGH (ref 70–99)

## 2024-02-24 SURGERY — EGD (ESOPHAGOGASTRODUODENOSCOPY)
Anesthesia: General

## 2024-02-24 MED ORDER — METOPROLOL TARTRATE 5 MG/5ML IV SOLN
INTRAVENOUS | Status: AC
  Filled 2024-02-24: qty 5

## 2024-02-24 MED ORDER — LIDOCAINE HCL (CARDIAC) PF 100 MG/5ML IV SOSY
PREFILLED_SYRINGE | INTRAVENOUS | Status: DC | PRN
  Administered 2024-02-24 (×2): 100 mg via INTRAVENOUS

## 2024-02-24 MED ORDER — SODIUM CHLORIDE 0.9 % IV SOLN: INTRAVENOUS | Status: DC

## 2024-02-24 MED ORDER — PROPOFOL 500 MG/50ML IV EMUL
INTRAVENOUS | Status: DC | PRN
Start: 2024-02-24 — End: 2024-02-24
  Administered 2024-02-24: 50 mg via INTRAVENOUS
  Administered 2024-02-24: 50 ug/kg/min via INTRAVENOUS

## 2024-02-24 MED ORDER — INSULIN ASPART 100 UNIT/ML IJ SOLN
INTRAMUSCULAR | Status: AC
  Filled 2024-02-24: qty 1

## 2024-02-24 MED ORDER — METOPROLOL TARTRATE 5 MG/5ML IV SOLN
5.0000 mg | Freq: Once | INTRAVENOUS | Status: AC
  Administered 2024-02-24: 5 mg via INTRAVENOUS

## 2024-02-24 MED ORDER — INSULIN ASPART 100 UNIT/ML IJ SOLN
8.0000 [IU] | Freq: Once | INTRAMUSCULAR | Status: AC
  Administered 2024-02-24: 8 [IU] via SUBCUTANEOUS

## 2024-02-24 NOTE — H&P (Signed)
 Rogelia Copping, MD [facility] [address] [place], [place] [address] Phone:[phone] Fax : [phone]  Primary Care Physician:  Sharma Coyer, MD Primary Gastroenterologist:  Dr. Copping  Pre-Procedure History & Physical: HPI:  Corey Petersen is a 63 y.o. male is here for an endoscopy.   Past Medical History:  Diagnosis Date   Diabetes mellitus without complication (HCC)    DKA, type 2 (HCC) 07/09/2022   Hypertension     Past Surgical History:  Procedure Laterality Date   COLONOSCOPY N/A 03/09/2022   Procedure: COLONOSCOPY;  Surgeon: Copping Rogelia, MD;  Location: [facility] ENDOSCOPY;  Service: Endoscopy;  Laterality: N/A;   ESOPHAGOGASTRODUODENOSCOPY N/A 03/09/2022   Procedure: ESOPHAGOGASTRODUODENOSCOPY (EGD);  Surgeon: Copping Rogelia, MD;  Location: [facility] ENDOSCOPY;  Service: Endoscopy;  Laterality: N/A;   ESOPHAGOGASTRODUODENOSCOPY N/A 01/13/2024   Procedure: EGD (ESOPHAGOGASTRODUODENOSCOPY);  Surgeon: Copping Rogelia, MD;  Location: [facility] ENDOSCOPY;  Service: Endoscopy;  Laterality: N/A;   HERNIA REPAIR      Prior to Admission medications   Medication Sig Start Date End Date Taking? Authorizing Provider  amLODipine  (NORVASC ) 10 MG tablet TAKE 1 TABLET BY MOUTH EVERY DAY 01/30/24  Yes Simmons-Robinson, Makiera, MD  aspirin  EC 81 MG tablet Take 1 tablet (81 mg total) by mouth daily. Swallow whole. 09/05/22   Drubel, Manuelita, PA-C  atorvastatin  (LIPITOR) 40 MG tablet TAKE 1 TABLET BY MOUTH EVERY DAY 01/23/24   Simmons-Robinson, Coyer, MD  Blood Glucose Monitoring Suppl (ONETOUCH VERIO) w/Device KIT Use to check blood sugar before meals and fasting, as as needed 10/31/22   Drubel, Manuelita, PA-C  calcium  carbonate (TUMS - DOSED IN MG ELEMENTAL CALCIUM ) 500 MG chewable tablet Chew 1 tablet (200 mg of elemental calcium  total) by mouth 3 (three) times daily with meals. Patient taking differently: Chew 200 mg of elemental calcium  by mouth 3 (three) times daily with meals. As needed  07/10/22   Amin, Sumayya, MD  gabapentin  (NEURONTIN ) 300 MG capsule Take 1-2 capsules (300-600 mg total) by mouth 3 (three) times daily. 02/20/23   Penumalli, Vikram R, MD  glucose blood test strip Use as instructed 10/31/22   Drubel, Manuelita, PA-C  insulin  glargine (LANTUS  SOLOSTAR) 100 UNIT/ML Solostar Pen INJECT 75 UNITS INTO THE SKIN AT BEDTIME. 10/24/23   Simmons-Robinson, Makiera, MD  Insulin  Pen Needle 29G X 12.7MM MISC 1 Needle by Does not apply route at bedtime. 09/19/22   Simmons-Robinson, Coyer, MD  losartan  (COZAAR ) 100 MG tablet Take 1 tablet (100 mg total) by mouth daily. 07/01/23   Simmons-Robinson, Coyer, MD  metFORMIN  (GLUCOPHAGE ) 1000 MG tablet Take 1,000 mg by mouth. 01/01/24   [provider]  metFORMIN  (GLUCOPHAGE -XR) 500 MG 24 hr tablet Take 500 mg by mouth 2 (two) times daily with a meal.    [provider]  pantoprazole  (PROTONIX ) 40 MG tablet Take 2 tablets (80 mg total) by mouth 2 (two) times daily for 60 days, THEN 1 tablet (40 mg total) 2 (two) times daily. 01/13/24 05/12/24  Von Bellis, MD  tadalafil  (CIALIS ) 20 MG tablet TAKE 1 TABLET BY MOUTH EVERY DAY AS NEEDED FOR ERECTILE DYSFUNCTION 05/13/23   Penne Knee, MD  Vitamin D , Ergocalciferol , (DRISDOL ) 1.25 MG (50000 UNIT) CAPS capsule Take 1 capsule (50,000 Units total) by mouth every 7 (seven) days. 01/14/24 04/13/24  Von Bellis, MD    Allergies as of 01/16/2024 - Review Complete 01/14/2024  Allergen Reaction Noted   Ace inhibitors Cough 03/15/2016    Family History  Problem Relation Age of Onset  Diabetes Mother    Hypertension Mother    Seizures Mother    Hypertension Father    Colon cancer Father     Social History   Socioeconomic History   Marital status: Married    Spouse name: Priscella   Number of children: 2   Years of education: Not on file   Highest education level: Not on file  Occupational History   Occupation: disability  Tobacco Use   Smoking status: Never    Smokeless tobacco: Never  Vaping Use   Vaping status: Never Used  Substance and Sexual Activity   Alcohol use: Never   Drug use: Never   Sexual activity: Not Currently  Other Topics Concern   Not on file  Social History Narrative   Not on file   Social Drivers of Health   Financial Resource Strain: Low Risk  (06/19/2023)   Overall Financial Resource Strain (CARDIA)    Difficulty of Paying Living Expenses: Not very hard  Food Insecurity: No Food Insecurity (01/11/2024)   Hunger Vital Sign    Worried About Running Out of Food in the Last Year: Never true    Ran Out of Food in the Last Year: Never true  Transportation Needs: No Transportation Needs (01/11/2024)   PRAPARE - Administrator, Civil Service (Medical): No    Lack of Transportation (Non-Medical): No  Physical Activity: Inactive (06/19/2023)   Exercise Vital Sign    Days of Exercise per Week: 0 days    Minutes of Exercise per Session: 0 min  Stress: No Stress Concern Present (06/19/2023)   [employer] of Occupational Health - Occupational Stress Questionnaire    Feeling of Stress : Not at all  Social Connections: Moderately Integrated (06/19/2023)   Social Connection and Isolation Panel    Frequency of Communication with Friends and Family: Never    Frequency of Social Gatherings with Friends and Family: More than three times a week    Attends Religious Services: More than 4 times per year    Active Member of [employer] or Organizations: No    Attends Banker Meetings: Never    Marital Status: Married  Catering manager Violence: Not At Risk (01/11/2024)   Humiliation, Afraid, Rape, and Kick questionnaire    Fear of Current or Ex-Partner: No    Emotionally Abused: No    Physically Abused: No    Sexually Abused: No    Review of Systems: See HPI, otherwise negative ROS  Physical Exam: BP (!) 194/99   Pulse 74   Temp (!) 96.4 F (35.8 C) (Temporal)   Resp 18   Ht 5' 6 (1.676 m)   Wt  115.2 kg   SpO2 100%   BMI 41.00 kg/m  General:   Alert,  pleasant and cooperative in NAD Head:  Normocephalic and atraumatic. Neck:  Supple; no masses or thyromegaly. Lungs:  Clear throughout to auscultation.    Heart:  Regular rate and rhythm. Abdomen:  Soft, nontender and nondistended. Normal bowel sounds, without guarding, and without rebound.   Neurologic:  Alert and  oriented x4;  grossly normal neurologically.  Impression/Plan: Corey Petersen is here for an endoscopy to be performed for follow-up for esophagitis  Risks, benefits, limitations, and alternatives regarding  endoscopy have been reviewed with the patient.  Questions have been answered.  All parties agreeable.   Rogelia Copping, MD  02/24/2024, 10:34 AM

## 2024-02-24 NOTE — Anesthesia Preprocedure Evaluation (Addendum)
 Anesthesia Evaluation  Patient identified by MRN, date of birth, ID band Patient awake    Reviewed: Allergy & Precautions, NPO status , Patient's Chart, lab work & pertinent test results  History of Anesthesia Complications Negative for: history of anesthetic complications  Airway Mallampati: I   Neck ROM: Full    Dental  (+) Chipped   Pulmonary sleep apnea    Pulmonary exam normal breath sounds clear to auscultation       Cardiovascular hypertension, +CHF  Normal cardiovascular exam Rhythm:Regular Rate:Normal  ECG 01/11/24: ST (HR 115) with PVCs; LVH   Neuro/Psych negative neurological ROS     GI/Hepatic negative GI ROS,,,  Endo/Other  diabetes, Poorly Controlled, Type 2  Class 3 obesityObesity   Renal/GU negative Renal ROS     Musculoskeletal   Abdominal   Peds  Hematology negative hematology ROS (+)   Anesthesia Other Findings   Reproductive/Obstetrics                              Anesthesia Physical Anesthesia Plan  ASA: 3  Anesthesia Plan: General   Post-op Pain Management:    Induction: Intravenous  PONV Risk Score and Plan: 2 and Propofol  infusion, TIVA and Treatment may vary due to age or medical condition  Airway Management Planned: Natural Airway  Additional Equipment:   Intra-op Plan:   Post-operative Plan:   Informed Consent: I have reviewed the patients History and Physical, chart, labs and discussed the procedure including the risks, benefits and alternatives for the proposed anesthesia with the patient or authorized representative who has indicated his/her understanding and acceptance.       Plan Discussed with: CRNA  Anesthesia Plan Comments: (LMA/GETA backup discussed.  Patient consented for risks of anesthesia including but not limited to:  - adverse reactions to medications - damage to eyes, teeth, lips or other oral mucosa - nerve damage due to  positioning  - sore throat or hoarseness - damage to heart, brain, nerves, lungs, other parts of body or loss of life  Informed patient about role of CRNA in peri- and intra-operative care.  Patient voiced understanding.)         Anesthesia Quick Evaluation

## 2024-02-24 NOTE — Transfer of Care (Signed)
 Immediate Anesthesia Transfer of Care Note  Patient: Corey Petersen  Procedure(s) Performed: EGD (ESOPHAGOGASTRODUODENOSCOPY)  Patient Location: PACU  Anesthesia Type:General  Level of Consciousness: drowsy and patient cooperative  Airway & Oxygen Therapy: Patient Spontanous Breathing  Post-op Assessment: Report given to RN and Post -op Vital signs reviewed and stable  Post vital signs: stable  Last Vitals:  Vitals Value Taken Time  BP    Temp    Pulse 63 02/24/24 10:38  Resp 21 02/24/24 10:38  SpO2 98 % 02/24/24 10:38  Vitals shown include unfiled device data.  Last Pain:  Vitals:   02/24/24 0948  TempSrc: Temporal  PainSc: 0-No pain         Complications: No notable events documented.

## 2024-02-24 NOTE — Op Note (Signed)
 [facility] Patient Name: Corey Petersen Procedure Date: 02/24/2024 10:17 AM MRN: [id number] Account #: [id number] Date of Birth: Jun 01, 1961 Admit Type: Outpatient Age: 64 Room: [facility] ENDO ROOM 4 Gender: Male Note Status: Finalized Instrument Name: Upper Endoscope [id number] Procedure:             Upper GI endoscopy Indications:           Follow-up of esophagitis Providers:             Rogelia Copping MD, MD Referring MD:          simmons-robinson, makiera Medicines:             Propofol  per Anesthesia Complications:         No immediate complications. Procedure:             Pre-Anesthesia Assessment:                        - Prior to the procedure, a History and Physical was                         performed, and patient medications and allergies were                         reviewed. The patient's tolerance of previous                         anesthesia was also reviewed. The risks and benefits                         of the procedure and the sedation options and risks                         were discussed with the patient. All questions were                         answered, and informed consent was obtained. Prior                         Anticoagulants: The patient has taken no anticoagulant                         or antiplatelet agents. ASA Grade Assessment: II - A                         patient with mild systemic disease. After reviewing                         the risks and benefits, the patient was deemed in                         satisfactory condition to undergo the procedure.                        After obtaining informed consent, the endoscope was                         passed under direct vision. Throughout the procedure,  the patient's blood pressure, pulse, and oxygen                         saturations were monitored continuously. The Endoscope                         was introduced through the mouth, and  advanced to the                         second part of duodenum. The upper GI endoscopy was                         accomplished without difficulty. The patient tolerated                         the procedure well. Findings:      The Z-line was irregular and was found at the gastroesophageal junction.      The stomach was normal.      The examined duodenum was normal. Impression:            - Z-line irregular, at the gastroesophageal junction.                        - Normal stomach.                        - Normal examined duodenum.                        - No specimens collected. Recommendation:        - Discharge patient to home.                        - Resume previous diet.                        - Continue present medications. Procedure Code(s):     --- Professional ---                        [id number], Esophagogastroduodenoscopy, flexible,                         transoral; diagnostic, including collection of                         specimen(s) by brushing or washing, when performed                         (separate procedure) Diagnosis Code(s):     --- Professional ---                        K20.90, Esophagitis, unspecified without bleeding CPT copyright 2022 American Medical Association. All rights reserved. The codes documented in this report are preliminary and upon coder review may  be revised to meet current compliance requirements. Rogelia Copping MD, MD 02/24/2024 10:37:50 AM This report has been signed electronically. Number of Addenda: 0 Note Initiated On: 02/24/2024 10:17 AM Estimated Blood Loss:  Estimated blood loss: none.      [facility]

## 2024-02-25 ENCOUNTER — Encounter: Payer: Self-pay | Admitting: Gastroenterology

## 2024-02-25 NOTE — Anesthesia Postprocedure Evaluation (Signed)
 Anesthesia Post Note  Patient: Corey Petersen  Procedure(s) Performed: EGD (ESOPHAGOGASTRODUODENOSCOPY)  Patient location during evaluation: PACU Anesthesia Type: General Level of consciousness: awake and alert, oriented and patient cooperative Pain management: pain level controlled Vital Signs Assessment: post-procedure vital signs reviewed and stable Respiratory status: spontaneous breathing, nonlabored ventilation and respiratory function stable Cardiovascular status: blood pressure returned to baseline and stable Postop Assessment: adequate PO intake Anesthetic complications: no   There were no known notable events for this encounter.   Last Vitals:  Vitals:   02/24/24 1048 02/24/24 1058  BP: (!) 142/84 (!) 156/92  Pulse: 65 61  Resp: 20 13  Temp:    SpO2: 97% 98%    Last Pain:  Vitals:   02/24/24 1058  TempSrc:   PainSc: 0-No pain                 Alfonso Ruths

## 2024-03-24 ENCOUNTER — Other Ambulatory Visit: Payer: Self-pay | Admitting: Family Medicine

## 2024-03-24 DIAGNOSIS — E1159 Type 2 diabetes mellitus with other circulatory complications: Secondary | ICD-10-CM

## 2024-03-25 ENCOUNTER — Observation Stay: Admission: EM | Admit: 2024-03-25 | Discharge: 2024-03-28 | Disposition: A | Discharge status: Home / Self Care

## 2024-03-25 ENCOUNTER — Other Ambulatory Visit: Payer: Self-pay

## 2024-03-25 DIAGNOSIS — I517 Cardiomegaly: Secondary | ICD-10-CM

## 2024-03-25 DIAGNOSIS — I4719 Other supraventricular tachycardia: Secondary | ICD-10-CM | POA: Diagnosis not present

## 2024-03-25 DIAGNOSIS — R109 Unspecified abdominal pain: Secondary | ICD-10-CM | POA: Diagnosis not present

## 2024-03-25 DIAGNOSIS — I160 Hypertensive urgency: Secondary | ICD-10-CM | POA: Diagnosis not present

## 2024-03-25 DIAGNOSIS — E669 Obesity, unspecified: Secondary | ICD-10-CM | POA: Insufficient documentation

## 2024-03-25 DIAGNOSIS — Z1152 Encounter for screening for COVID-19: Secondary | ICD-10-CM | POA: Insufficient documentation

## 2024-03-25 DIAGNOSIS — R1084 Generalized abdominal pain: Secondary | ICD-10-CM | POA: Insufficient documentation

## 2024-03-25 DIAGNOSIS — R0602 Shortness of breath: Secondary | ICD-10-CM | POA: Diagnosis not present

## 2024-03-25 DIAGNOSIS — R059 Cough, unspecified: Secondary | ICD-10-CM | POA: Diagnosis not present

## 2024-03-25 DIAGNOSIS — K219 Gastro-esophageal reflux disease without esophagitis: Principal | ICD-10-CM | POA: Insufficient documentation

## 2024-03-25 DIAGNOSIS — N281 Cyst of kidney, acquired: Secondary | ICD-10-CM | POA: Diagnosis not present

## 2024-03-25 DIAGNOSIS — Z6841 Body Mass Index (BMI) 40.0 and over, adult: Secondary | ICD-10-CM | POA: Insufficient documentation

## 2024-03-25 DIAGNOSIS — R112 Nausea with vomiting, unspecified: Secondary | ICD-10-CM | POA: Diagnosis not present

## 2024-03-25 DIAGNOSIS — R7989 Other specified abnormal findings of blood chemistry: Secondary | ICD-10-CM | POA: Diagnosis not present

## 2024-03-25 DIAGNOSIS — B9681 Helicobacter pylori [H. pylori] as the cause of diseases classified elsewhere: Secondary | ICD-10-CM

## 2024-03-25 DIAGNOSIS — E785 Hyperlipidemia, unspecified: Secondary | ICD-10-CM

## 2024-03-25 DIAGNOSIS — R079 Chest pain, unspecified: Secondary | ICD-10-CM | POA: Diagnosis not present

## 2024-03-25 DIAGNOSIS — E1142 Type 2 diabetes mellitus with diabetic polyneuropathy: Secondary | ICD-10-CM

## 2024-03-25 DIAGNOSIS — I422 Other hypertrophic cardiomyopathy: Secondary | ICD-10-CM | POA: Diagnosis not present

## 2024-03-25 DIAGNOSIS — K8020 Calculus of gallbladder without cholecystitis without obstruction: Secondary | ICD-10-CM

## 2024-03-25 DIAGNOSIS — R0789 Other chest pain: Secondary | ICD-10-CM | POA: Diagnosis not present

## 2024-03-25 DIAGNOSIS — I4729 Other ventricular tachycardia: Secondary | ICD-10-CM

## 2024-03-25 DIAGNOSIS — I10 Essential (primary) hypertension: Secondary | ICD-10-CM | POA: Diagnosis not present

## 2024-03-25 DIAGNOSIS — E119 Type 2 diabetes mellitus without complications: Secondary | ICD-10-CM

## 2024-03-25 DIAGNOSIS — R000 Tachycardia, unspecified: Secondary | ICD-10-CM | POA: Diagnosis not present

## 2024-03-25 LAB — CBC
HCT: 45.9 % (ref 39.0–52.0)
Hemoglobin: 15.9 g/dL (ref 13.0–17.0)
MCH: 32 pg (ref 26.0–34.0)
MCHC: 34.6 g/dL (ref 30.0–36.0)
MCV: 92.4 fL (ref 80.0–100.0)
Platelets: 364 K/uL (ref 150–400)
RBC: 4.97 MIL/uL (ref 4.22–5.81)
RDW: 11.9 % (ref 11.5–15.5)
WBC: 14 K/uL — ABNORMAL HIGH (ref 4.0–10.5)
nRBC: 0 % (ref 0.0–0.2)

## 2024-03-25 LAB — COMPREHENSIVE METABOLIC PANEL WITH GFR
ALT: 16 U/L (ref 0–44)
AST: 25 U/L (ref 15–41)
Albumin: 3.3 g/dL — ABNORMAL LOW (ref 3.5–5.0)
Alkaline Phosphatase: 96 U/L (ref 38–126)
Anion gap: 19 — ABNORMAL HIGH (ref 5–15)
BUN: 26 mg/dL — ABNORMAL HIGH (ref 8–23)
CO2: 24 mmol/L (ref 22–32)
Calcium: 8.8 mg/dL — ABNORMAL LOW (ref 8.9–10.3)
Chloride: 92 mmol/L — ABNORMAL LOW (ref 98–111)
Creatinine, Ser: 1.33 mg/dL — ABNORMAL HIGH (ref 0.61–1.24)
GFR, Estimated: >60 mL/min (ref >60)
Glucose, Bld: 329 mg/dL — ABNORMAL HIGH (ref 70–99)
Potassium: 3.6 mmol/L (ref 3.5–5.1)
Sodium: 135 mmol/L (ref 135–145)
Total Bilirubin: 2.8 mg/dL — ABNORMAL HIGH (ref 0.0–1.2)
Total Protein: 7.2 g/dL (ref 6.5–8.1)

## 2024-03-25 LAB — LIPASE, BLOOD: Lipase: 27 U/L (ref 11–51)

## 2024-03-25 LAB — CBG MONITORING, ED: Glucose-Capillary: 331 mg/dL — ABNORMAL HIGH (ref 70–99)

## 2024-03-25 MED ORDER — ONDANSETRON HCL 4 MG/2ML IJ SOLN
4.0000 mg | Freq: Once | INTRAMUSCULAR | Status: AC
  Administered 2024-03-25: 4 mg via INTRAVENOUS
  Filled 2024-03-25: qty 2

## 2024-03-25 MED ORDER — SODIUM CHLORIDE 0.9 % IV BOLUS (SEPSIS)
1000.0000 mL | Freq: Once | INTRAVENOUS | Status: AC
  Administered 2024-03-25: 1000 mL via INTRAVENOUS

## 2024-03-25 MED ORDER — MORPHINE SULFATE (PF) 4 MG/ML IV SOLN
4.0000 mg | Freq: Once | INTRAVENOUS | Status: AC
  Administered 2024-03-26: 4 mg via INTRAVENOUS
  Filled 2024-03-25: qty 1

## 2024-03-25 NOTE — ED Triage Notes (Signed)
 Pt arrives via EMS from home for c/o N/V since Sunday accomp by muscle cramping

## 2024-03-25 NOTE — ED Triage Notes (Signed)
 Pt BIB EMS with c/o n/v that started Sunday. Pt has hx of esophagitis and diabetes. Pt report belching and ABD pain.

## 2024-03-25 NOTE — ED Provider Notes (Signed)
 [facility] Provider Note    Event Date/Time   First MD Initiated Contact with Patient 03/25/24 2339     (approximate)   History   Nausea and Emesis   HPI  Corey Petersen is a 63 y.o. male with history of hypertension, diabetes who presents to the emergency department with complaints of chest discomfort, productive cough, abdominal cramping, vomiting, no diarrhea.  Denies urinary symptoms.  Has had previous history of hernia repair.  Reports he feels like he is cramping all over.  No sick contacts or recent travel.   History provided by patient.    Past Medical History:  Diagnosis Date   Diabetes mellitus without complication (HCC)    DKA, type 2 (HCC) 07/09/2022   Hypertension     Past Surgical History:  Procedure Laterality Date   COLONOSCOPY N/A 03/09/2022   Procedure: COLONOSCOPY;  Surgeon: Jinny Carmine, MD;  Location: [facility] ENDOSCOPY;  Service: Endoscopy;  Laterality: N/A;   ESOPHAGOGASTRODUODENOSCOPY N/A 03/09/2022   Procedure: ESOPHAGOGASTRODUODENOSCOPY (EGD);  Surgeon: Jinny Carmine, MD;  Location: [facility] ENDOSCOPY;  Service: Endoscopy;  Laterality: N/A;   ESOPHAGOGASTRODUODENOSCOPY N/A 01/13/2024   Procedure: EGD (ESOPHAGOGASTRODUODENOSCOPY);  Surgeon: Jinny Carmine, MD;  Location: [facility] ENDOSCOPY;  Service: Endoscopy;  Laterality: N/A;   ESOPHAGOGASTRODUODENOSCOPY N/A 02/24/2024   Procedure: EGD (ESOPHAGOGASTRODUODENOSCOPY);  Surgeon: Jinny Carmine, MD;  Location: [facility] ENDOSCOPY;  Service: Endoscopy;  Laterality: N/A;   HERNIA REPAIR      MEDICATIONS:  Prior to Admission medications   Medication Sig Start Date End Date Taking? Authorizing Provider  amLODipine  (NORVASC ) 10 MG tablet TAKE 1 TABLET BY MOUTH EVERY DAY 01/30/24   Simmons-Robinson, Rockie, MD  aspirin  EC 81 MG tablet Take 1 tablet (81 mg total) by mouth daily. Swallow whole. 09/05/22   Cyndi Shaver, PA-C  atorvastatin  (LIPITOR) 40 MG tablet TAKE 1 TABLET BY MOUTH EVERY DAY 01/23/24    Simmons-Robinson, Rockie, MD  Blood Glucose Monitoring Suppl (ONETOUCH VERIO) w/Device KIT Use to check blood sugar before meals and fasting, as as needed 10/31/22   Drubel, Shaver, PA-C  calcium  carbonate (TUMS - DOSED IN MG ELEMENTAL CALCIUM ) 500 MG chewable tablet Chew 1 tablet (200 mg of elemental calcium  total) by mouth 3 (three) times daily with meals. Patient taking differently: Chew 200 mg of elemental calcium  by mouth 3 (three) times daily with meals. As needed 07/10/22   Amin, Sumayya, MD  gabapentin  (NEURONTIN ) 300 MG capsule Take 1-2 capsules (300-600 mg total) by mouth 3 (three) times daily. 02/20/23   Penumalli, Eduard SAUNDERS, MD  glucose blood test strip Use as instructed 10/31/22   Drubel, Shaver, PA-C  insulin  glargine (LANTUS  SOLOSTAR) 100 UNIT/ML Solostar Pen INJECT 75 UNITS INTO THE SKIN AT BEDTIME. 10/24/23   Simmons-Robinson, Makiera, MD  Insulin  Pen Needle 29G X 12.7MM MISC 1 Needle by Does not apply route at bedtime. 09/19/22   Simmons-Robinson, Rockie, MD  losartan  (COZAAR ) 100 MG tablet Take 1 tablet (100 mg total) by mouth daily. 07/01/23   Simmons-Robinson, Rockie, MD  metFORMIN  (GLUCOPHAGE ) 1000 MG tablet Take 1,000 mg by mouth. 01/01/24   [provider]  metFORMIN  (GLUCOPHAGE -XR) 500 MG 24 hr tablet Take 500 mg by mouth 2 (two) times daily with a meal.    [provider]  pantoprazole  (PROTONIX ) 40 MG tablet Take 2 tablets (80 mg total) by mouth 2 (two) times daily for 60 days, THEN 1 tablet (40 mg total) 2 (two) times daily. 01/13/24 05/12/24  Von Bellis, MD  tadalafil  (  CIALIS ) 20 MG tablet TAKE 1 TABLET BY MOUTH EVERY DAY AS NEEDED FOR ERECTILE DYSFUNCTION 05/13/23   Penne Knee, MD  Vitamin D , Ergocalciferol , (DRISDOL ) 1.25 MG (50000 UNIT) CAPS capsule Take 1 capsule (50,000 Units total) by mouth every 7 (seven) days. 01/14/24 04/13/24  Von Bellis, MD    Physical Exam   Triage Vital Signs: ED Triage Vitals  Encounter Vitals Group     BP 03/25/24  2021 (!) 132/92     Girls Systolic BP Percentile --      Girls Diastolic BP Percentile --      Boys Systolic BP Percentile --      Boys Diastolic BP Percentile --      Pulse Rate 03/25/24 2021 (!) 120     Resp 03/25/24 2021 20     Temp 03/25/24 2023 (!) 97.3 F (36.3 C)     Temp Source 03/25/24 2023 Axillary     SpO2 03/25/24 2013 97 %     Weight 03/25/24 2022 254 lb (115.2 kg)     Height --      Head Circumference --      Peak Flow --      Pain Score 03/25/24 2021 10     Pain Loc --      Pain Education --      Exclude from Growth Chart --     Most recent vital signs: Vitals:   03/26/24 0014 03/26/24 0030  BP:  (!) 203/96  Pulse:  93  Resp:  18  Temp: 98 F (36.7 C)   SpO2:  94%    CONSTITUTIONAL: Alert, responds appropriately to questions.  Appears uncomfortable HEAD: Normocephalic, atraumatic EYES: Conjunctivae clear, pupils appear equal, sclera nonicteric ENT: normal nose; moist mucous membranes NECK: Supple, normal ROM CARD: RRR; S1 and S2 appreciated RESP: Normal chest excursion without splinting or tachypnea; breath sounds clear and equal bilaterally; no wheezes, no rhonchi, no rales, no hypoxia or respiratory distress, speaking full sentences ABD/GI: Non-distended; soft, diffusely tender without guarding or rebound BACK: The back appears normal EXT: Normal ROM in all joints; no deformity noted, no edema, no calf tenderness or calf swelling SKIN: Normal color for age and race; warm; no rash on exposed skin NEURO: Moves all extremities equally, normal speech PSYCH: The patient's mood and manner are appropriate.   ED Results / Procedures / Treatments   LABS: (all labs ordered are listed, but only abnormal results are displayed) Labs Reviewed  COMPREHENSIVE METABOLIC PANEL WITH GFR - Abnormal; Notable for the following components:      Result Value   Chloride 92 (*)    Glucose, Bld 329 (*)    BUN 26 (*)    Creatinine, Ser 1.33 (*)    Calcium  8.8 (*)     Albumin 3.3 (*)    Total Bilirubin 2.8 (*)    Anion gap 19 (*)    All other components within normal limits  CBC - Abnormal; Notable for the following components:   WBC 14.0 (*)    All other components within normal limits  BLOOD GAS, VENOUS - Abnormal; Notable for the following components:   pH, Ven 7.44 (*)    Bicarbonate 35.3 (*)    Acid-Base Excess 9.4 (*)    All other components within normal limits  BETA-HYDROXYBUTYRIC ACID - Abnormal; Notable for the following components:   Beta-Hydroxybutyric Acid 0.69 (*)    All other components within normal limits  CBG MONITORING, ED - Abnormal; Notable for the  following components:   Glucose-Capillary 331 (*)    All other components within normal limits  CBG MONITORING, ED - Abnormal; Notable for the following components:   Glucose-Capillary 281 (*)    All other components within normal limits  TROPONIN I (HIGH SENSITIVITY) - Abnormal; Notable for the following components:   Troponin I (High Sensitivity) 48 (*)    All other components within normal limits  RESP PANEL BY RT-PCR (RSV, FLU A&B, COVID)  RVPGX2  LIPASE, BLOOD  MAGNESIUM  CK  URINALYSIS, ROUTINE W REFLEX MICROSCOPIC  TROPONIN I (HIGH SENSITIVITY)     EKG:  EKG Interpretation Date/Time:  Wednesday March 25 2024 20:28:52 EDT Ventricular Rate:  120 PR Interval:  138 QRS Duration:  74 QT Interval:  314 QTC Calculation: 443 R Axis:   2  Text Interpretation: Sinus tachycardia with Premature atrial complexes with Abberant conduction Left ventricular hypertrophy with repolarization abnormality ( R in aVL ) Abnormal ECG When compared with ECG of 11-Jan-2024 14:15, Premature ventricular complexes are no longer Present Abberant conduction is now Present Confirmed by Neomi Neptune [id number]) on 03/25/2024 11:53:07 PM           EKG Interpretation Date/Time:  Thursday March 26 2024 00:58:33 EDT Ventricular Rate:  94 PR Interval:  150 QRS Duration:  87 QT  Interval:  377 QTC Calculation: 472 R Axis:   10  Text Interpretation: Sinus rhythm Atrial premature complex Sinus pause Abnormal R-wave progression, early transition Abnormal T, consider ischemia, lateral leads Confirmed by Neomi Neptune [phone]) on 03/26/2024 1:41:04 AM        RADIOLOGY: My personal review and interpretation of imaging: Gallstones without cholecystitis.  Chest x-ray clear.  I have personally reviewed all radiology reports.   CT ABDOMEN PELVIS W CONTRAST Result Date: 03/26/2024 CLINICAL DATA:  Abdominal pain, nausea, vomiting EXAM: CT ABDOMEN AND PELVIS WITH CONTRAST TECHNIQUE: Multidetector CT imaging of the abdomen and pelvis was performed using the standard protocol following bolus administration of intravenous contrast. RADIATION DOSE REDUCTION: This exam was performed according to the departmental dose-optimization program which includes automated exposure control, adjustment of the mA and/or kV according to patient size and/or use of iterative reconstruction technique. CONTRAST:  OMNIPAQUE  IOHEXOL  350 MG/ML SOLN COMPARISON:  02/10/2024 FINDINGS: Lower chest: No acute abnormality Hepatobiliary: Layering gallstones within the gallbladder, similar to prior study. No CT evidence of acute cholecystitis. No focal hepatic abnormality or biliary ductal dilatation. Pancreas: No focal abnormality or ductal dilatation. Spleen: No focal abnormality.  Normal size. Adrenals/Urinary Tract: 8.5 cm cyst in the left kidney is unchanged and appears benign. No follow-up imaging recommended. No stones or hydronephrosis. Adrenal glands and urinary bladder unremarkable. Stomach/Bowel: Stomach, large and small bowel grossly unremarkable. Vascular/Lymphatic: No evidence of aneurysm or adenopathy. Aortic atherosclerosis. Reproductive: No visible focal abnormality. Other: No free fluid or free air. Musculoskeletal: No acute bony abnormality. IMPRESSION: No acute findings in the abdomen or pelvis.  Cholelithiasis.  No CT evidence of acute cholecystitis. Aortic atherosclerosis. Electronically Signed   By: Franky Crease M.D.   On: 03/26/2024 00:33   DG Chest Portable 1 View Result Date: 03/26/2024 CLINICAL DATA:  Chest pain, shortness of breath, cough EXAM: PORTABLE CHEST 1 VIEW COMPARISON:  01/11/2024 FINDINGS: Cardiomegaly. No confluent airspace opacities or effusions. No edema. No acute bony abnormality. IMPRESSION: Cardiomegaly.  No active disease. Electronically Signed   By: Franky Crease M.D.   On: 03/26/2024 00:31     PROCEDURES:  Critical Care performed: Yes, see critical  care procedure note(s)   CRITICAL CARE Performed by: Josette Sink   Total critical care time: 30 minutes  Critical care time was exclusive of separately billable procedures and treating other patients.  Critical care was necessary to treat or prevent imminent or life-threatening deterioration.  Critical care was time spent personally by me on the following activities: development of treatment plan with patient and/or surrogate as well as nursing, discussions with consultants, evaluation of patient's response to treatment, examination of patient, obtaining history from patient or surrogate, ordering and performing treatments and interventions, ordering and review of laboratory studies, ordering and review of radiographic studies, pulse oximetry and re-evaluation of patient's condition.   SABRA1-3 Lead EKG Interpretation  Performed by: Savilla Turbyfill, Josette SAILOR, DO Authorized by: Brando Taves, Josette SAILOR, DO     Interpretation: normal     ECG rate:  94   ECG rate assessment: normal     Rhythm: sinus rhythm     Ectopy: none     Conduction: normal       IMPRESSION / MDM / ASSESSMENT AND PLAN / ED COURSE  I reviewed the triage vital signs and the nursing notes.    Patient here with chest pain, cough, congestion, abdominal pain, vomiting.  The patient is on the cardiac monitor to evaluate for evidence of arrhythmia and/or  significant heart rate changes.   DIFFERENTIAL DIAGNOSIS (includes but not limited to):   Viral URI, pneumonia, ACS, viral gastroenteritis, colitis, diverticulitis, appendicitis, bowel obstruction, UTI, electrolyte derangement, dehydration   Patient's presentation is most consistent with acute presentation with potential threat to life or bodily function.   PLAN: Workup initiated from triage.  Patient has mild AKI.  Does have elevated anion gap but normal bicarb.  Blood glucose of 329.  Has history of DKA but given normal bicarb I feel this is less likely.  Elevated anion gap may be secondary to mild AKI, dehydration.  Urine pending to evaluate for ketonuria.  Will also obtain VBG.  Will obtain troponin x 2 given complaints of chest pain.  EKG shows no new ischemic change.  Will obtain chest x-ray.  COVID, flu and RSV swab pending.  Will obtain CT of the abdomen pelvis given complaints of generalized abdominal pain.  Will give IV fluids, pain and nausea medicine and reassess.   MEDICATIONS GIVEN IN ED: Medications  hydrALAZINE  (APRESOLINE ) injection 10 mg (has no administration in time range)  sodium chloride  0.9 % bolus 1,000 mL (0 mLs Intravenous Stopped 03/26/24 0103)  ondansetron  (ZOFRAN ) injection 4 mg (4 mg Intravenous Given 03/25/24 2358)  morphine  (PF) 4 MG/ML injection 4 mg (4 mg Intravenous Given 03/26/24 0008)  iohexol  (OMNIPAQUE ) 350 MG/ML injection 100 mL (100 mLs Intravenous Contrast Given 03/26/24 0021)  sodium chloride  0.9 % bolus 1,000 mL (1,000 mLs Intravenous New Bag/Given 03/26/24 0103)  pantoprazole  (PROTONIX ) injection 40 mg (40 mg Intravenous Given 03/26/24 0101)  amLODipine  (NORVASC ) tablet 10 mg (10 mg Oral Given 03/26/24 0111)  losartan  (COZAAR ) tablet 100 mg (100 mg Oral Given 03/26/24 0110)  alum & mag hydroxide-simeth (MAALOX/MYLANTA) 200-200-20 MG/5ML suspension 30 mL (30 mLs Oral Given 03/26/24 0114)  potassium chloride  SA (KLOR-CON  M) CR tablet 40 mEq (40 mEq Oral  Given 03/26/24 0113)     ED COURSE:  1:00 AM  Pt had a 9 beat run of ventricular tachycardia.  Reports he felt palpitations during this time.  Still hemodynamically stable.  Will repeat EKG.  Second troponin pending.  Normal potassium and magnesium.  Will  continue to closely monitor.  I do not feel he needs to be started on antiarrhythmics yet.  He has hypertensive here and takes amlodipine  and losartan .  States he has been taking them but is not sure if they have been staying down due to his vomiting.  Given nausea has been better controlled, will give home doses of medications here to see if this can control his blood pressure well.  Still having some intermittent abdominal discomfort, belching.  Will give Mylanta, Protonix .  CT of the abdomen pelvis shows cholelithiasis without cholecystitis.  No other acute abnormality.  1:47 AM  Pt continues to be hypertensive.  VBG shows normal bicarb, normal pH.  Patient not in DKA.  Only minimally elevated beta hydroxybutyric acid level.  Patient is working on giving us  a urine sample now.  Have recommended admission for blood pressure control, further monitoring for any arrhythmias, symptom management.  He is comfortable with this plan.  Will discuss with the hospitalist.   CONSULTS:  Consulted and discussed patient's case with hospitalist, Dr. Lawence.  I have recommended admission and consulting physician agrees and will place admission orders.  Patient (and family if present) agree with this plan.   I reviewed all nursing notes, vitals, pertinent previous records.  All labs, EKGs, imaging ordered have been independently reviewed and interpreted by myself.    OUTSIDE RECORDS REVIEWED: Reviewed patient's recent EGD for esophagitis.       FINAL CLINICAL IMPRESSION(S) / ED DIAGNOSES   Final diagnoses:  Nausea and vomiting, unspecified vomiting type  Chest pain, unspecified type  Generalized abdominal cramping  Nonsustained ventricular tachycardia  (HCC)  Hypertension, unspecified type     Rx / DC Orders   ED Discharge Orders     None        Note:  This document was prepared using Dragon voice recognition software and may include unintentional dictation errors.   Cristofher Livecchi, Josette SAILOR, DO 03/26/24 [phone]

## 2024-03-26 ENCOUNTER — Observation Stay (HOSPITAL_BASED_OUTPATIENT_CLINIC_OR_DEPARTMENT_OTHER)
Admit: 2024-03-26 | Discharge: 2024-03-26 | Disposition: A | Discharge status: Home / Self Care | Attending: Family Medicine | Admitting: Family Medicine

## 2024-03-26 ENCOUNTER — Other Ambulatory Visit (HOSPITAL_COMMUNITY): Payer: Self-pay

## 2024-03-26 ENCOUNTER — Observation Stay

## 2024-03-26 ENCOUNTER — Telehealth (HOSPITAL_COMMUNITY): Payer: Self-pay | Admitting: Pharmacy Technician

## 2024-03-26 ENCOUNTER — Emergency Department

## 2024-03-26 DIAGNOSIS — R059 Cough, unspecified: Secondary | ICD-10-CM | POA: Diagnosis not present

## 2024-03-26 DIAGNOSIS — R079 Chest pain, unspecified: Secondary | ICD-10-CM

## 2024-03-26 DIAGNOSIS — E785 Hyperlipidemia, unspecified: Secondary | ICD-10-CM

## 2024-03-26 DIAGNOSIS — K8020 Calculus of gallbladder without cholecystitis without obstruction: Secondary | ICD-10-CM

## 2024-03-26 DIAGNOSIS — I517 Cardiomegaly: Secondary | ICD-10-CM | POA: Diagnosis not present

## 2024-03-26 DIAGNOSIS — N281 Cyst of kidney, acquired: Secondary | ICD-10-CM | POA: Diagnosis not present

## 2024-03-26 DIAGNOSIS — R0602 Shortness of breath: Secondary | ICD-10-CM | POA: Diagnosis not present

## 2024-03-26 DIAGNOSIS — R1084 Generalized abdominal pain: Secondary | ICD-10-CM

## 2024-03-26 DIAGNOSIS — R109 Unspecified abdominal pain: Secondary | ICD-10-CM | POA: Diagnosis not present

## 2024-03-26 DIAGNOSIS — K219 Gastro-esophageal reflux disease without esophagitis: Principal | ICD-10-CM

## 2024-03-26 DIAGNOSIS — E1142 Type 2 diabetes mellitus with diabetic polyneuropathy: Secondary | ICD-10-CM

## 2024-03-26 DIAGNOSIS — I4729 Other ventricular tachycardia: Secondary | ICD-10-CM | POA: Diagnosis not present

## 2024-03-26 DIAGNOSIS — I10 Essential (primary) hypertension: Secondary | ICD-10-CM

## 2024-03-26 DIAGNOSIS — R112 Nausea with vomiting, unspecified: Secondary | ICD-10-CM

## 2024-03-26 DIAGNOSIS — R7989 Other specified abnormal findings of blood chemistry: Secondary | ICD-10-CM

## 2024-03-26 LAB — CK: Total CK: 230 U/L (ref 49–397)

## 2024-03-26 LAB — GLUCOSE, CAPILLARY
Glucose-Capillary: 241 mg/dL — ABNORMAL HIGH (ref 70–99)
Glucose-Capillary: 178 mg/dL — ABNORMAL HIGH (ref 70–99)
Glucose-Capillary: 159 mg/dL — ABNORMAL HIGH (ref 70–99)
Glucose-Capillary: 104 mg/dL — ABNORMAL HIGH (ref 70–99)
Glucose-Capillary: 183 mg/dL — ABNORMAL HIGH (ref 70–99)

## 2024-03-26 LAB — BLOOD GAS, VENOUS
Acid-Base Excess: 9.4 mmol/L — ABNORMAL HIGH (ref 0.0–2.0)
Bicarbonate: 35.3 mmol/L — ABNORMAL HIGH (ref 20.0–28.0)
O2 Saturation: 76.6 %
Patient temperature: 37
pCO2, Ven: 52 mmHg (ref 44–60)
pH, Ven: 7.44 — ABNORMAL HIGH (ref 7.25–7.43)
pO2, Ven: 44 mmHg (ref 32–45)

## 2024-03-26 LAB — BASIC METABOLIC PANEL WITH GFR
Anion gap: 9 (ref 5–15)
BUN: 23 mg/dL (ref 8–23)
CO2: 29 mmol/L (ref 22–32)
Calcium: 8 mg/dL — ABNORMAL LOW (ref 8.9–10.3)
Chloride: 100 mmol/L (ref 98–111)
Creatinine, Ser: 1.02 mg/dL (ref 0.61–1.24)
GFR, Estimated: >60 mL/min (ref >60)
Glucose, Bld: 272 mg/dL — ABNORMAL HIGH (ref 70–99)
Potassium: 3.7 mmol/L (ref 3.5–5.1)
Sodium: 138 mmol/L (ref 135–145)

## 2024-03-26 LAB — ECHOCARDIOGRAM COMPLETE
AR max vel: 2.29 cm2
AV Area VTI: 2.1 cm2
AV Area mean vel: 2.14 cm2
AV Mean grad: 3 mmHg
AV Peak grad: 6.2 mmHg
Ao pk vel: 1.24 m/s
Area-P 1/2: 4.31 cm2
Calc EF: 73.5 %
Height: 66 in
MV VTI: 2.34 cm2
S' Lateral: 2.9 cm
Single Plane A2C EF: 77.7 %
Single Plane A4C EF: 74.8 %
Weight: 4064 [oz_av]

## 2024-03-26 LAB — RESP PANEL BY RT-PCR (RSV, FLU A&B, COVID)  RVPGX2
Influenza A by PCR: NEGATIVE
Influenza B by PCR: NEGATIVE
Resp Syncytial Virus by PCR: NEGATIVE
SARS Coronavirus 2 by RT PCR: NEGATIVE

## 2024-03-26 LAB — URINALYSIS, ROUTINE W REFLEX MICROSCOPIC
Bacteria, UA: NONE SEEN
Bilirubin Urine: NEGATIVE
Glucose, UA: >=500 mg/dL — AB
Ketones, ur: 5 mg/dL — AB
Leukocytes,Ua: NEGATIVE
Nitrite: NEGATIVE
Protein, ur: NEGATIVE mg/dL
Specific Gravity, Urine: >1.046 — ABNORMAL HIGH (ref 1.005–1.030)
Squamous Epithelial / HPF: 0 /HPF (ref 0–5)
pH: 6 (ref 5.0–8.0)

## 2024-03-26 LAB — MAGNESIUM: Magnesium: 2 mg/dL (ref 1.7–2.4)

## 2024-03-26 LAB — TROPONIN I (HIGH SENSITIVITY)
Troponin I (High Sensitivity): 48 ng/L — ABNORMAL HIGH (ref <18)
Troponin I (High Sensitivity): 72 ng/L — ABNORMAL HIGH (ref <18)
Troponin I (High Sensitivity): 72 ng/L — ABNORMAL HIGH (ref <18)
Troponin I (High Sensitivity): 73 ng/L — ABNORMAL HIGH (ref <18)

## 2024-03-26 LAB — APTT: aPTT: 27 s (ref 24–36)

## 2024-03-26 LAB — CBC
HCT: 43.5 % (ref 39.0–52.0)
Hemoglobin: 14.8 g/dL (ref 13.0–17.0)
MCH: 32 pg (ref 26.0–34.0)
MCHC: 34 g/dL (ref 30.0–36.0)
MCV: 94 fL (ref 80.0–100.0)
Platelets: 289 K/uL (ref 150–400)
RBC: 4.63 MIL/uL (ref 4.22–5.81)
RDW: 11.9 % (ref 11.5–15.5)
WBC: 15 K/uL — ABNORMAL HIGH (ref 4.0–10.5)
nRBC: 0 % (ref 0.0–0.2)

## 2024-03-26 LAB — HEMOGLOBIN A1C
Hgb A1c MFr Bld: 7.2 % — ABNORMAL HIGH (ref 4.8–5.6)
Mean Plasma Glucose: 159.94 mg/dL

## 2024-03-26 LAB — CBG MONITORING, ED: Glucose-Capillary: 281 mg/dL — ABNORMAL HIGH (ref 70–99)

## 2024-03-26 LAB — PROTIME-INR
INR: 1.2 (ref 0.8–1.2)
Prothrombin Time: 15.8 s — ABNORMAL HIGH (ref 11.4–15.2)

## 2024-03-26 LAB — BETA-HYDROXYBUTYRIC ACID: Beta-Hydroxybutyric Acid: 0.69 mmol/L — ABNORMAL HIGH (ref 0.05–0.27)

## 2024-03-26 MED ORDER — PANTOPRAZOLE SODIUM 40 MG IV SOLR
40.0000 mg | Freq: Once | INTRAVENOUS | Status: AC
  Administered 2024-03-26: 40 mg via INTRAVENOUS
  Filled 2024-03-26: qty 10

## 2024-03-26 MED ORDER — INSULIN GLARGINE 100 UNIT/ML ~~LOC~~ SOLN
25.0000 [IU] | Freq: Every day | SUBCUTANEOUS | Status: DC
  Filled 2024-03-26: qty 0.25

## 2024-03-26 MED ORDER — VITAMIN D (ERGOCALCIFEROL) 1.25 MG (50000 UNIT) PO CAPS: 50000.0000 [IU] | ORAL_CAPSULE | ORAL | Status: DC

## 2024-03-26 MED ORDER — SODIUM CHLORIDE 0.9 % IV BOLUS (SEPSIS)
1000.0000 mL | Freq: Once | INTRAVENOUS | Status: AC
  Administered 2024-03-26: 1000 mL via INTRAVENOUS

## 2024-03-26 MED ORDER — SENNOSIDES-DOCUSATE SODIUM 8.6-50 MG PO TABS: 1.0000 | ORAL_TABLET | Freq: Every day | ORAL | Status: DC | PRN

## 2024-03-26 MED ORDER — TECHNETIUM TC 99M MEBROFENIN IV KIT
7.5000 | PACK | Freq: Once | INTRAVENOUS | Status: AC | PRN
  Administered 2024-03-26: 7.5 via INTRAVENOUS

## 2024-03-26 MED ORDER — INSULIN GLARGINE 100 UNIT/ML ~~LOC~~ SOLN
20.0000 [IU] | Freq: Every day | SUBCUTANEOUS | Status: DC
  Administered 2024-03-26 – 2024-03-28 (×3): 20 [IU] via SUBCUTANEOUS
  Filled 2024-03-26 (×3): qty 0.2

## 2024-03-26 MED ORDER — HEPARIN BOLUS VIA INFUSION
1200.0000 [IU] | Freq: Once | INTRAVENOUS | Status: DC
  Filled 2024-03-26: qty 1200

## 2024-03-26 MED ORDER — ALUM & MAG HYDROXIDE-SIMETH 200-200-20 MG/5ML PO SUSP
30.0000 mL | Freq: Once | ORAL | Status: AC
  Administered 2024-03-26: 30 mL via ORAL
  Filled 2024-03-26: qty 30

## 2024-03-26 MED ORDER — HEPARIN BOLUS VIA INFUSION
4000.0000 [IU] | Freq: Once | INTRAVENOUS | Status: AC
  Administered 2024-03-26: 4000 [IU] via INTRAVENOUS
  Filled 2024-03-26: qty 4000

## 2024-03-26 MED ORDER — LOSARTAN POTASSIUM 50 MG PO TABS
100.0000 mg | ORAL_TABLET | Freq: Every day | ORAL | Status: DC
  Administered 2024-03-27 – 2024-03-28 (×2): 100 mg via ORAL
  Filled 2024-03-26 (×2): qty 2

## 2024-03-26 MED ORDER — TRAMADOL HCL 50 MG PO TABS: 25.0000 mg | ORAL_TABLET | Freq: Three times a day (TID) | ORAL | Status: DC | PRN

## 2024-03-26 MED ORDER — POTASSIUM CHLORIDE IN NACL 20-0.9 MEQ/L-% IV SOLN
INTRAVENOUS | Status: DC
  Filled 2024-03-26 (×3): qty 1000

## 2024-03-26 MED ORDER — ATORVASTATIN CALCIUM 20 MG PO TABS
40.0000 mg | ORAL_TABLET | Freq: Every day | ORAL | Status: DC
  Administered 2024-03-26 – 2024-03-28 (×3): 40 mg via ORAL
  Filled 2024-03-26 (×3): qty 2

## 2024-03-26 MED ORDER — ENOXAPARIN SODIUM 40 MG/0.4ML IJ SOSY
40.0000 mg | PREFILLED_SYRINGE | INTRAMUSCULAR | Status: DC
  Administered 2024-03-26 – 2024-03-27 (×2): 40 mg via SUBCUTANEOUS
  Filled 2024-03-26 (×2): qty 0.4

## 2024-03-26 MED ORDER — CARVEDILOL 6.25 MG PO TABS
12.5000 mg | ORAL_TABLET | Freq: Two times a day (BID) | ORAL | Status: DC
  Administered 2024-03-26 – 2024-03-28 (×4): 12.5 mg via ORAL
  Filled 2024-03-26 (×4): qty 2

## 2024-03-26 MED ORDER — PANTOPRAZOLE SODIUM 40 MG PO TBEC: 40.0000 mg | DELAYED_RELEASE_TABLET | Freq: Two times a day (BID) | ORAL | Status: DC

## 2024-03-26 MED ORDER — ONDANSETRON HCL 4 MG/2ML IJ SOLN
4.0000 mg | Freq: Four times a day (QID) | INTRAMUSCULAR | Status: DC | PRN
Start: 2024-03-26 — End: 2024-03-27
  Administered 2024-03-26: 4 mg via INTRAVENOUS
  Filled 2024-03-26: qty 2

## 2024-03-26 MED ORDER — LACTATED RINGERS IV SOLN: INTRAVENOUS | Status: DC

## 2024-03-26 MED ORDER — HYDROCHLOROTHIAZIDE 12.5 MG PO TABS
12.5000 mg | ORAL_TABLET | Freq: Every day | ORAL | Status: DC
  Administered 2024-03-26 – 2024-03-28 (×3): 12.5 mg via ORAL
  Filled 2024-03-26 (×3): qty 1

## 2024-03-26 MED ORDER — HEPARIN (PORCINE) 25000 UT/250ML-% IV SOLN
1200.0000 [IU]/h | INTRAVENOUS | Status: DC
  Administered 2024-03-26: 1200 [IU]/h via INTRAVENOUS
  Filled 2024-03-26: qty 250

## 2024-03-26 MED ORDER — PERFLUTREN LIPID MICROSPHERE
1.0000 mL | INTRAVENOUS | Status: AC | PRN
  Administered 2024-03-26: 2 mL via INTRAVENOUS

## 2024-03-26 MED ORDER — AMLODIPINE BESYLATE 10 MG PO TABS
10.0000 mg | ORAL_TABLET | Freq: Every day | ORAL | Status: DC
  Administered 2024-03-27 – 2024-03-28 (×2): 10 mg via ORAL
  Filled 2024-03-26 (×2): qty 1

## 2024-03-26 MED ORDER — IOHEXOL 350 MG/ML SOLN
100.0000 mL | Freq: Once | INTRAVENOUS | Status: AC | PRN
  Administered 2024-03-26: 100 mL via INTRAVENOUS

## 2024-03-26 MED ORDER — ASPIRIN 81 MG PO CHEW
81.0000 mg | CHEWABLE_TABLET | Freq: Every day | ORAL | Status: DC
  Administered 2024-03-26 – 2024-03-28 (×3): 81 mg via ORAL
  Filled 2024-03-26 (×3): qty 1

## 2024-03-26 MED ORDER — HYDRALAZINE HCL 20 MG/ML IJ SOLN
10.0000 mg | Freq: Once | INTRAMUSCULAR | Status: AC
  Administered 2024-03-26: 10 mg via INTRAVENOUS
  Filled 2024-03-26: qty 1

## 2024-03-26 MED ORDER — PANTOPRAZOLE SODIUM 40 MG IV SOLR
40.0000 mg | Freq: Two times a day (BID) | INTRAVENOUS | Status: DC
  Administered 2024-03-26 – 2024-03-28 (×5): 40 mg via INTRAVENOUS
  Filled 2024-03-26 (×5): qty 10

## 2024-03-26 MED ORDER — GABAPENTIN 300 MG PO CAPS
300.0000 mg | ORAL_CAPSULE | Freq: Every day | ORAL | Status: DC
  Administered 2024-03-26 – 2024-03-27 (×2): 300 mg via ORAL
  Filled 2024-03-26 (×2): qty 1

## 2024-03-26 MED ORDER — TRAMADOL HCL 50 MG PO TABS: 50.0000 mg | ORAL_TABLET | Freq: Three times a day (TID) | ORAL | Status: DC | PRN

## 2024-03-26 MED ORDER — LOSARTAN POTASSIUM 50 MG PO TABS
100.0000 mg | ORAL_TABLET | Freq: Once | ORAL | Status: AC
  Administered 2024-03-26: 100 mg via ORAL
  Filled 2024-03-26: qty 2

## 2024-03-26 MED ORDER — INSULIN ASPART 100 UNIT/ML IJ SOLN: 0.0000 [IU] | Freq: Every day | INTRAMUSCULAR | Status: DC

## 2024-03-26 MED ORDER — AMLODIPINE BESYLATE 5 MG PO TABS
10.0000 mg | ORAL_TABLET | Freq: Once | ORAL | Status: AC
  Administered 2024-03-26: 10 mg via ORAL
  Filled 2024-03-26: qty 2

## 2024-03-26 MED ORDER — POTASSIUM CHLORIDE CRYS ER 20 MEQ PO TBCR
40.0000 meq | EXTENDED_RELEASE_TABLET | Freq: Once | ORAL | Status: AC
  Administered 2024-03-26: 40 meq via ORAL
  Filled 2024-03-26: qty 2

## 2024-03-26 MED ORDER — ENOXAPARIN SODIUM 60 MG/0.6ML IJ SOSY: 60.0000 mg | PREFILLED_SYRINGE | INTRAMUSCULAR | Status: DC

## 2024-03-26 MED ORDER — ONDANSETRON HCL 4 MG PO TABS
4.0000 mg | ORAL_TABLET | Freq: Four times a day (QID) | ORAL | Status: DC | PRN
Start: 2024-03-26 — End: 2024-03-28

## 2024-03-26 MED ORDER — METOCLOPRAMIDE HCL 5 MG/ML IJ SOLN
5.0000 mg | Freq: Four times a day (QID) | INTRAMUSCULAR | Status: AC
  Administered 2024-03-26 – 2024-03-27 (×2): 5 mg via INTRAVENOUS
  Filled 2024-03-26 (×2): qty 2

## 2024-03-26 MED ORDER — INSULIN ASPART 100 UNIT/ML IJ SOLN
0.0000 [IU] | Freq: Three times a day (TID) | INTRAMUSCULAR | Status: DC
  Administered 2024-03-26: 7 [IU] via SUBCUTANEOUS
  Administered 2024-03-26 – 2024-03-27 (×2): 4 [IU] via SUBCUTANEOUS
  Administered 2024-03-28 (×2): 3 [IU] via SUBCUTANEOUS
  Filled 2024-03-26 (×5): qty 1

## 2024-03-26 NOTE — Assessment & Plan Note (Signed)
 Continue statin therapy

## 2024-03-26 NOTE — Progress Notes (Signed)
 ANTICOAGULATION CONSULT NOTE  Pharmacy Consult for heparin  infusion Indication: ACS/STEMI  Allergies  Allergen Reactions   Ace Inhibitors Cough    Patient Measurements: Height: 5' 6 (167.6 cm) Weight: 115.2 kg (254 lb) IBW/kg (Calculated) : 63.8 Heparin  Dosing Weight: 90.4 kg  Vital Signs: Temp: 98.3 F (36.8 C) (08/21 0348) Temp Source: Oral (08/21 0348) BP: 157/77 (08/21 0500) Pulse Rate: 94 (08/21 0500)  Labs: Recent Labs    03/25/24 2033 03/26/24 0419  HGB 15.9 14.8  HCT 45.9 43.5  PLT 364 289  CREATININE 1.33* 1.02  CKTOTAL 230  --   TROPONINIHS 48* 72*    Estimated Creatinine Clearance: 88.5 mL/min (by C-G formula based on SCr of 1.02 mg/dL).   Medical History: Past Medical History:  Diagnosis Date   Diabetes mellitus without complication (HCC)    DKA, type 2 (HCC) 07/09/2022   Hypertension     Assessment: Pt is a 63 yo male presenting to ED c/o chest discomfort, productive cough, abdominal cramping, vomiting, no diarrhea,  found with elevated troponin I level trending up.   Goal of Therapy:  Heparin  level 0.3-0.7 units/ml Monitor platelets by anticoagulation protocol: Yes   Plan:  Bolus 4000 units x 1 Start heparin  infusion at 1200 units/hr Will check HL in 6 hr after start of infusion CBC daily while on heparin   Rankin CANDIE Dills, PharmD, [facility] 03/26/2024 6:27 AM

## 2024-03-26 NOTE — Telephone Encounter (Signed)
 Patient Product/process development scientist completed.    The patient is insured through [facility]. Patient has Medicare and is not eligible for a copay card, but may be able to apply for patient assistance or Medicare RX Payment Plan (Patient Must reach out to their plan, if eligible for payment plan), if available.    Ran test claim for Bidil 20-37.5 mg and Product Not on formulary  Ran test claim for isosorbide-hydralazine  (Bidil) 20-37.5 mg and Product Not on formulary  This test claim was processed through [employer]- copay amounts may vary at other pharmacies due to [employer], or as the patient moves through the different stages of their insurance plan.     Reyes Sharps, CPHT Pharmacy Technician III Certified Patient Advocate [facility] Patient Advocate Team Direct Number: [phone]  Fax: [phone]

## 2024-03-26 NOTE — Consult Note (Signed)
 Cardiology Consultation   Patient ID: Corey Petersen MRN: [id number]; DOB: 04-14-1961  Admit date: 03/25/2024 Date of Consult: 03/26/2024  PCP:  Sharma Coyer, MD   [facility] Providers Cardiologist:  None       Patient Profile: Corey Petersen is a 63 y.o. male with a hx of HTN, HLD, diabetes who is being seen 03/26/2024 for the evaluation of NSTEMI at the request of Dr. Jerelene.  History of Present Illness: Corey Petersen was seen in 2024 for chest pain.  Echo showed EF of 60 to 65%, grade 1 diastolic dysfunction, severe LVH with mid to apical cavity obliteration in systole, no LVOT gradient.  Cardiac CTA showed coronary calcium  score of 0.  The patient normally follows at the [place].  He was admitted 01/2024 with nausea, vomiting, gastroparesis, found to have minimally elevated troponin and acute esophagitis.  The patient presented to the ER 03/25/24 with recurrent nausea and vomiting with associated chest pain. He reported cramping and needing to burp. He denied diarrhea or melena. He was told he has ulcers in his esophagus. He reports compliance with medications, but is unsure they stay down due to vomiting.  In the ER BP 132/92 (up to 203/96), HR 120bpm, RR 20, afebrile.Labs showed Scr 1.33, BUN 26, albumin 3.3, WBC 14. HS trop 48>72. CXR showed cardiomegaly. CT abd/pelvis with. EKG showed ST, PACs, TWI aVL, LVH. EKG strip showed NSVT, 9 beats. The patient was given IVF, PPI, morphine , hydralazine /amlodipine , potassium, and started on IV heparin .    Past Medical History:  Diagnosis Date   Diabetes mellitus without complication (HCC)    DKA, type 2 (HCC) 07/09/2022   Hypertension     Past Surgical History:  Procedure Laterality Date   COLONOSCOPY N/A 03/09/2022   Procedure: COLONOSCOPY;  Surgeon: Jinny Carmine, MD;  Location: [facility] ENDOSCOPY;  Service: Endoscopy;  Laterality: N/A;   ESOPHAGOGASTRODUODENOSCOPY N/A 03/09/2022   Procedure: ESOPHAGOGASTRODUODENOSCOPY  (EGD);  Surgeon: Jinny Carmine, MD;  Location: [facility] ENDOSCOPY;  Service: Endoscopy;  Laterality: N/A;   ESOPHAGOGASTRODUODENOSCOPY N/A 01/13/2024   Procedure: EGD (ESOPHAGOGASTRODUODENOSCOPY);  Surgeon: Jinny Carmine, MD;  Location: [facility] ENDOSCOPY;  Service: Endoscopy;  Laterality: N/A;   ESOPHAGOGASTRODUODENOSCOPY N/A 02/24/2024   Procedure: EGD (ESOPHAGOGASTRODUODENOSCOPY);  Surgeon: Jinny Carmine, MD;  Location: [facility] ENDOSCOPY;  Service: Endoscopy;  Laterality: N/A;   HERNIA REPAIR       Home Medications:  Prior to Admission medications   Medication Sig Start Date End Date Taking? Authorizing Provider  amLODipine  (NORVASC ) 10 MG tablet TAKE 1 TABLET BY MOUTH EVERY DAY 01/30/24  Yes Simmons-Robinson, Makiera, MD  aspirin  EC 81 MG tablet Take 1 tablet (81 mg total) by mouth daily. Swallow whole. 09/05/22  Yes Drubel, Manuelita, PA-C  atorvastatin  (LIPITOR) 40 MG tablet TAKE 1 TABLET BY MOUTH EVERY DAY 01/23/24  Yes Simmons-Robinson, Coyer, MD  calcium  carbonate (TUMS - DOSED IN MG ELEMENTAL CALCIUM ) 500 MG chewable tablet Chew 1 tablet (200 mg of elemental calcium  total) by mouth 3 (three) times daily with meals. Patient taking differently: Chew 200 mg of elemental calcium  by mouth 3 (three) times daily with meals. As needed 07/10/22  Yes Caleen Qualia, MD  gabapentin  (NEURONTIN ) 300 MG capsule Take 1-2 capsules (300-600 mg total) by mouth 3 (three) times daily. Patient taking differently: Take 300-600 mg by mouth at bedtime. 02/20/23  Yes Penumalli, Vikram R, MD  hydrochlorothiazide  (HYDRODIURIL ) 25 MG tablet Take 12.5 mg by mouth daily. 01/27/24  Yes [provider]  insulin  glargine (LANTUS  SOLOSTAR) 100  UNIT/ML Solostar Pen INJECT 75 UNITS INTO THE SKIN AT BEDTIME. 10/24/23  Yes Simmons-Robinson, Makiera, MD  losartan  (COZAAR ) 100 MG tablet Take 1 tablet (100 mg total) by mouth daily. 07/01/23  Yes Simmons-Robinson, Rockie, MD  metFORMIN  (GLUCOPHAGE -XR) 500 MG 24 hr tablet Take 500 mg by mouth  2 (two) times daily with a meal.   Yes [provider]  pantoprazole  (PROTONIX ) 40 MG tablet Take 2 tablets (80 mg total) by mouth 2 (two) times daily for 60 days, THEN 1 tablet (40 mg total) 2 (two) times daily. 01/13/24 05/12/24 Yes Von Bellis, MD  senna-docusate (SENOKOT-S) 8.6-50 MG tablet Take 1 tablet by mouth daily as needed for mild constipation. 01/01/24  Yes [provider]  tadalafil  (CIALIS ) 20 MG tablet TAKE 1 TABLET BY MOUTH EVERY DAY AS NEEDED FOR ERECTILE DYSFUNCTION 05/13/23  Yes Penne Knee, MD  Vitamin D , Ergocalciferol , (DRISDOL ) 1.25 MG (50000 UNIT) CAPS capsule Take 1 capsule (50,000 Units total) by mouth every 7 (seven) days. 01/14/24 04/13/24 Yes Von Bellis, MD  Blood Glucose Monitoring Suppl [facility] VERIO) w/Device KIT Use to check blood sugar before meals and fasting, as as needed 10/31/22   Cyndi Shaver, PA-C  glucose blood test strip Use as instructed 10/31/22   Drubel, Shaver, PA-C  Insulin  Pen Needle 29G X 12.7MM MISC 1 Needle by Does not apply route at bedtime. 09/19/22   Simmons-Robinson, Rockie, MD    Scheduled Meds:  [START ON 03/27/2024] amLODipine   10 mg Oral Daily   atorvastatin   40 mg Oral Daily   gabapentin   300-600 mg Oral QHS   hydrochlorothiazide   12.5 mg Oral Daily   insulin  aspart  0-20 Units Subcutaneous TID WC   insulin  aspart  0-5 Units Subcutaneous QHS   [START ON 03/27/2024] losartan   100 mg Oral Daily   pantoprazole  (PROTONIX ) IV  40 mg Intravenous Q12H   [START ON 03/30/2024] Vitamin D  (Ergocalciferol )  50,000 Units Oral Q7 days   Continuous Infusions:  0.9 % NaCl with KCl 20 mEq / L 100 mL/hr at 03/26/24 0257   heparin  1,200 Units/hr (03/26/24 0730)   PRN Meds: ondansetron  **OR** ondansetron  (ZOFRAN ) IV, senna-docusate, traMADol   Allergies:    Allergies  Allergen Reactions   Ace Inhibitors Cough    Social History:   Social History   Socioeconomic History   Marital status: Married    Spouse name: Priscella    Number of children: 2   Years of education: Not on file   Highest education level: Not on file  Occupational History   Occupation: disability  Tobacco Use   Smoking status: Never   Smokeless tobacco: Never  Vaping Use   Vaping status: Never Used  Substance and Sexual Activity   Alcohol use: Never   Drug use: Never   Sexual activity: Not Currently  Other Topics Concern   Not on file  Social History Narrative   Not on file   Social Drivers of Health   Financial Resource Strain: Low Risk  (06/19/2023)   Overall Financial Resource Strain (CARDIA)    Difficulty of Paying Living Expenses: Not very hard  Food Insecurity: No Food Insecurity (01/11/2024)   Hunger Vital Sign    Worried About Running Out of Food in the Last Year: Never true    Ran Out of Food in the Last Year: Never true  Transportation Needs: No Transportation Needs (01/11/2024)   PRAPARE - Administrator, Civil Service (Medical): No    Lack of Transportation (  Non-Medical): No  Physical Activity: Inactive (06/19/2023)   Exercise Vital Sign    Days of Exercise per Week: 0 days    Minutes of Exercise per Session: 0 min  Stress: No Stress Concern Present (06/19/2023)   [employer] of Occupational Health - Occupational Stress Questionnaire    Feeling of Stress : Not at all  Social Connections: Moderately Integrated (06/19/2023)   Social Connection and Isolation Panel    Frequency of Communication with Friends and Family: Never    Frequency of Social Gatherings with Friends and Family: More than three times a week    Attends Religious Services: More than 4 times per year    Active Member of [employer] or Organizations: No    Attends Banker Meetings: Never    Marital Status: Married  Catering manager Violence: Not At Risk (01/11/2024)   Humiliation, Afraid, Rape, and Kick questionnaire    Fear of Current or Ex-Partner: No    Emotionally Abused: No    Physically Abused: No    Sexually Abused:  No    Family History:    Family History  Problem Relation Age of Onset   Diabetes Mother    Hypertension Mother    Seizures Mother    Hypertension Father    Colon cancer Father      ROS:  Please see the history of present illness.   All other ROS reviewed and negative.     Physical Exam/Data: Vitals:   03/26/24 0430 03/26/24 0500 03/26/24 0731 03/26/24 0731  BP: (!) 176/71 (!) 157/77    Pulse: 96 94    Resp: 18 17    Temp:    98.5 F (36.9 C)  TempSrc:    Oral  SpO2: 97% 94% 95%   Weight:      Height:        Intake/Output Summary (Last 24 hours) at 03/26/2024 0740 Last data filed at 03/26/2024 0257 Gross per 24 hour  Intake 2000 ml  Output --  Net 2000 ml      03/25/2024    8:22 PM 02/24/2024    9:48 AM 01/12/2024   12:00 AM  Last 3 Weights  Weight (lbs) 254 lb 254 lb 242 lb  Weight (kg) 115.214 kg 115.214 kg 109.77 kg     Body mass index is 41 kg/m.  General:  Well nourished, well developed, in no acute distress HEENT: normal Neck: no JVD Vascular: No carotid bruits; Distal pulses 2+ bilaterally Cardiac:  normal S1, S2; RRR; no murmur  Lungs:  clear to auscultation bilaterally, no wheezing, rhonchi or rales  Abd: soft, nontender, no hepatomegaly  Ext: no edema Musculoskeletal:  No deformities, BUE and BLE strength normal and equal Skin: warm and dry  Neuro:  CNs 2-12 intact, no focal abnormalities noted Psych:  Normal affect   EKG:  The EKG was personally reviewed and demonstrates:  ST 120bpm, PAC, LVH with repol Telemetry:  Telemetry was personally reviewed and demonstrates:  SR 80-90s  Relevant CV Studies:  Cardiac CTA 2024   IMPRESSION: 1. Normal coronary calcium  score of 0.  Patient is low risk.   2. Normal coronary origin with left dominance.   3. No evidence of CAD.   4. CAD-RADS 0. Consider non-atherosclerotic causes of chest pain.  Echo 2024   1. Left ventricular ejection fraction, by estimation, is 60 to 65%. The  left ventricle  has normal function. The left ventricle has no regional  wall motion abnormalities. There is severe  concentric left ventricular  hypertrophy with mid to apical cavity  obliteration in systole. No LVOT gradient measured. Left ventricular  diastolic parameters are consistent with Grade I diastolic dysfunction  (impaired relaxation).   2. Right ventricular systolic function is normal. The right ventricular  size is normal. Mildly increased right ventricular wall thickness.  Tricuspid regurgitation signal is inadequate for assessing PA pressure.   3. Left atrial size was mildly dilated.   4. The mitral valve is normal in structure. No evidence of mitral valve  regurgitation. No evidence of mitral stenosis.   5. The aortic valve has an indeterminant number of cusps. Aortic valve  regurgitation is not visualized. No aortic stenosis is present.   6. There is borderline dilatation of the ascending aorta, measuring 37  mm.   7. The inferior vena cava is normal in size with greater than 50%  respiratory variability, suggesting right atrial pressure of 3 mmHg.   8. Consider w/u for LVH if clinically indicated.    Laboratory Data: High Sensitivity Troponin:   Recent Labs  Lab 03/25/24 2033 03/26/24 0419  TROPONINIHS 48* 72*     Chemistry Recent Labs  Lab 03/25/24 2033 03/26/24 0419  NA 135 138  K 3.6 3.7  CL 92* 100  CO2 24 29  GLUCOSE 329* 272*  BUN 26* 23  CREATININE 1.33* 1.02  CALCIUM  8.8* 8.0*  MG 2.0  --   GFRNONAA >60 >60  ANIONGAP 19* 9    Recent Labs  Lab 03/25/24 2033  PROT 7.2  ALBUMIN 3.3*  AST 25  ALT 16  ALKPHOS 96  BILITOT 2.8*   Lipids No results for input(s): CHOL, TRIG, HDL, LABVLDL, LDLCALC, CHOLHDL in the last 168 hours.  Hematology Recent Labs  Lab 03/25/24 2033 03/26/24 0419  WBC 14.0* 15.0*  RBC 4.97 4.63  HGB 15.9 14.8  HCT 45.9 43.5  MCV 92.4 94.0  MCH 32.0 32.0  MCHC 34.6 34.0  RDW 11.9 11.9  PLT 364 289   Thyroid  No  results for input(s): TSH, FREET4 in the last 168 hours.  BNPNo results for input(s): BNP, PROBNP in the last 168 hours.  DDimer No results for input(s): DDIMER in the last 168 hours.  Radiology/Studies:  CT ABDOMEN PELVIS W CONTRAST Result Date: 03/26/2024 CLINICAL DATA:  Abdominal pain, nausea, vomiting EXAM: CT ABDOMEN AND PELVIS WITH CONTRAST TECHNIQUE: Multidetector CT imaging of the abdomen and pelvis was performed using the standard protocol following bolus administration of intravenous contrast. RADIATION DOSE REDUCTION: This exam was performed according to the departmental dose-optimization program which includes automated exposure control, adjustment of the mA and/or kV according to patient size and/or use of iterative reconstruction technique. CONTRAST:  OMNIPAQUE  IOHEXOL  350 MG/ML SOLN COMPARISON:  02/10/2024 FINDINGS: Lower chest: No acute abnormality Hepatobiliary: Layering gallstones within the gallbladder, similar to prior study. No CT evidence of acute cholecystitis. No focal hepatic abnormality or biliary ductal dilatation. Pancreas: No focal abnormality or ductal dilatation. Spleen: No focal abnormality.  Normal size. Adrenals/Urinary Tract: 8.5 cm cyst in the left kidney is unchanged and appears benign. No follow-up imaging recommended. No stones or hydronephrosis. Adrenal glands and urinary bladder unremarkable. Stomach/Bowel: Stomach, large and small bowel grossly unremarkable. Vascular/Lymphatic: No evidence of aneurysm or adenopathy. Aortic atherosclerosis. Reproductive: No visible focal abnormality. Other: No free fluid or free air. Musculoskeletal: No acute bony abnormality. IMPRESSION: No acute findings in the abdomen or pelvis. Cholelithiasis.  No CT evidence of acute cholecystitis. Aortic atherosclerosis. Electronically Signed  By: Franky Crease M.D.   On: 03/26/2024 00:33   DG Chest Portable 1 View Result Date: 03/26/2024 CLINICAL DATA:  Chest pain, shortness of  breath, cough EXAM: PORTABLE CHEST 1 VIEW COMPARISON:  01/11/2024 FINDINGS: Cardiomegaly. No confluent airspace opacities or effusions. No edema. No acute bony abnormality. IMPRESSION: Cardiomegaly.  No active disease. Electronically Signed   By: Franky Crease M.D.   On: 03/26/2024 00:31     Assessment and Plan:  Chest pain - presented with N/V and atypical chest pain found to have minimally elevated troponin in the setting of elevated blood pressure and tachycardia - HS trop 48>72>72>73 - Cardiac CTA in 2024 showed no CAD - echo pending - he has h/o severe LVH with no obstruction - can stop heparin  - echo pending - suspect supply demand mismatch  N/V - CT abd/pelvis with cholelithiasis - per IM  HLD - LDL 72 - Lipitor 40mg  daily  HTN - severely elevated on admission - PTA amlodipine , hydrochlorothiazide  and Losartan . He reports compliance with medications - Amlodipine  10mg  daily - hydrochlorothiazide  12.5mg  daily - Losartan  100mg  daily - Bps still mildly elevated. May need BB therapy  DM2 - A1C 7.4 - per IM   For questions or updates, please contact [facility] Please consult [email or web address] for contact info under    Signed, Roemello Speyer VEAR Fishman, PA-C  03/26/2024 7:40 AM

## 2024-03-26 NOTE — Progress Notes (Signed)
 PHARMACIST - PHYSICIAN COMMUNICATION  CONCERNING:  Enoxaparin  (Lovenox ) for DVT Prophylaxis    RECOMMENDATION: Patient was prescribed enoxaprin 40mg  q24 hours for VTE prophylaxis.   Filed Weights   03/25/24 2022  Weight: 115.2 kg (254 lb)    Body mass index is 41 kg/m.  Estimated Creatinine Clearance: 67.9 mL/min (A) (by C-G formula based on SCr of 1.33 mg/dL (H)).   Based on [facility] policy patient is candidate for enoxaparin  0.5mg /kg TBW SQ every 24 hours based on BMI being >30.  DESCRIPTION: Pharmacy has adjusted enoxaparin  dose per [facility] policy.  Patient is now receiving enoxaparin  0.5 mg/kg every 24 hours   Rankin CANDIE Dills, PharmD, [facility] 03/26/2024 2:42 AM

## 2024-03-26 NOTE — Assessment & Plan Note (Signed)
-   The patient will be placed on a medical observation telemetry bed. - Will continue hydration with IV normal saline. - As needed antiemetics will be provided. - IV PPI therapy will be given.

## 2024-03-26 NOTE — Assessment & Plan Note (Signed)
-   Will continue antihypertensive therapy.

## 2024-03-26 NOTE — Progress Notes (Signed)
 PROGRESS NOTE    Corey Petersen  FMW:[id number] DOB: October 06, 1960 DOA: 03/25/2024 PCP: Sharma Coyer, MD  Chief Complaint  Patient presents with   Nausea   Emesis    Hospital Course:  Corey Petersen is a 63 y.o. African-American male with medical history diabetes mellitus and essential hypertension presented due to acute onset recurrent nausea and vomiting with associated chest discomfort as well as congestion and productive cough. Seen by Cardiology, elevated troponin likely demand ischemia. Seen by Surgery, HIDA negative - no evidence of cholecystitis. Suspect symptoms due to esophagitis, on IV PPI Hospital course as below  Subjective: Patient was examined at the bedside, new to me today. Continues to have nausea, belching On IV PPI, started CLD   Objective: Vitals:   03/26/24 0500 03/26/24 0731 03/26/24 0731 03/26/24 0753  BP: (!) 157/77   (!) 158/85  Pulse: 94   (!) 102  Resp: 17   20  Temp:   98.5 F (36.9 C) 97.8 F (36.6 C)  TempSrc:   Oral Oral  SpO2: 94% 95%  100%  Weight:      Height:        Intake/Output Summary (Last 24 hours) at 03/26/2024 0840 Last data filed at 03/26/2024 0257 Gross per 24 hour  Intake 2000 ml  Output --  Net 2000 ml   Filed Weights   03/25/24 2022  Weight: 115.2 kg    Examination: GENERAL:  63 y.o.-year-old African American male patient lying in the bed with no acute distress.  EYES: Pupils equal, round, reactive to light and accommodation. No scleral icterus. Extraocular muscles intact.  HEENT: Head atraumatic, normocephalic. Oropharynx and nasopharynx clear.  NECK:  Supple, no jugular venous distention. No thyroid  enlargement, no tenderness.  LUNGS: Normal breath sounds bilaterally, no wheezing, rales,rhonchi or crepitation. No use of accessory muscles of respiration.  CARDIOVASCULAR: Regular rate and rhythm, S1, S2 normal. No murmurs, rubs, or gallops.  ABDOMEN: Soft, nondistended, nontender. Bowel sounds present. No  organomegaly or mass.  EXTREMITIES: No pedal edema, cyanosis, or clubbing.  NEUROLOGIC: Cranial nerves II through XII are intact. Muscle strength 5/5 in all extremities. Sensation intact. Gait not checked.  PSYCHIATRIC: The patient is alert and oriented x 3.  Normal affect and good eye contact. SKIN: No obvious rash, lesion, or ulcer  Assessment & Plan:  Active Problems:   Intractable nausea and vomiting   Chest pain   Type 2 diabetes mellitus with peripheral neuropathy (HCC)   Dyslipidemia   Essential hypertension   GERD (gastroesophageal reflux disease)  Intractable nausea and vomiting Suspect 2/2 gastroparesis  Hx Esophagitis and H.pylori - was recently discharged 06/25, EGD with severe esophagitis, Reports ran out of medication PPI - Leukocytosis, suspect if reactive - Abdominal pelvic CT scan revealed no acute abnormalities.  It shows cholelithiasis without cholecystitis - IV PPI bid, Trial of Reglan , IV fluids - clear liquid diet, ADAT - NPO pm, if symptoms don't improve will consult Gastroenterology - antiemetics prn   Elevated Troponin likely 2/2 demand ischemia - Troponin flat, was on IV heparin , discontinued - Echo with no focal wall motion abnormalities, G1DD - on asa 81mg  - Follow up with Cardiology outpatient, appreciate recs inpatient  Cholelithiasis - Seen by Surgery, appreciate recs. HIDA scan negative   Type 2 diabetes mellitus with peripheral neuropathy (HCC) - HbA1c 7.2. Home dose 36units Lantus  bid - Lantus  20units daily, SSI, will titrate as po intake improves - hold off metformin , Continue Neurontin   Dyslipidemia - Continue statin therapy  Hypertensive urgency - resolved Essential hypertension - On Amlodipine  10mg , hydrochlorothiazide  12.5mg , Losartan  100mg  - Add Coreg  12.5mg  bid   GERD (gastroesophageal reflux disease) - The patient will be on IV PPI therapy  Obesity -- BMI 41 -- Lifestyle modification  DVT prophylaxis: Lovenox  SQ   Code  Status: Full Code Disposition:  TBD  Consultants:  Treatment Team:  Consulting Physician: Shlomo Wilbert SAUNDERS, MD Consulting Physician: Desiderio Schanz, MD  Procedures:  None  Antimicrobials:  Anti-infectives (From admission, onward)    None       Data Reviewed: I have personally reviewed following labs and imaging studies CBC: Recent Labs  Lab 03/25/24 2033 03/26/24 0419  WBC 14.0* 15.0*  HGB 15.9 14.8  HCT 45.9 43.5  MCV 92.4 94.0  PLT 364 289   Basic Metabolic Panel: Recent Labs  Lab 03/25/24 2033 03/26/24 0419  NA 135 138  K 3.6 3.7  CL 92* 100  CO2 24 29  GLUCOSE 329* 272*  BUN 26* 23  CREATININE 1.33* 1.02  CALCIUM  8.8* 8.0*  MG 2.0  --    GFR: Estimated Creatinine Clearance: 88.5 mL/min (by C-G formula based on SCr of 1.02 mg/dL). Liver Function Tests: Recent Labs  Lab 03/25/24 2033  AST 25  ALT 16  ALKPHOS 96  BILITOT 2.8*  PROT 7.2  ALBUMIN 3.3*   CBG: Recent Labs  Lab 03/25/24 2031 03/26/24 0100 03/26/24 0753  GLUCAP 331* 281* 241*    Recent Results (from the past 240 hours)  Resp panel by RT-PCR (RSV, Flu A&B, Covid) Anterior Nasal Swab     Status: None   Collection Time: 03/26/24 12:10 AM   Specimen: Anterior Nasal Swab  Result Value Ref Range Status   SARS Coronavirus 2 by RT PCR NEGATIVE NEGATIVE Final    Comment: (NOTE) SARS-CoV-2 target nucleic acids are NOT DETECTED.  The SARS-CoV-2 RNA is generally detectable in upper respiratory specimens during the acute phase of infection. The lowest concentration of SARS-CoV-2 viral copies this assay can detect is 138 copies/mL. A negative result does not preclude SARS-Cov-2 infection and should not be used as the sole basis for treatment or other patient management decisions. A negative result may occur with  improper specimen collection/handling, submission of specimen other than nasopharyngeal swab, presence of viral mutation(s) within the areas targeted by this assay, and  inadequate number of viral copies(<138 copies/mL). A negative result must be combined with clinical observations, patient history, and epidemiological information. The expected result is Negative.  Fact Sheet for Patients:  BloggerCourse.com  Fact Sheet for Healthcare Providers:  SeriousBroker.it  This test is no t yet approved or cleared by the [place]  FDA and  has been authorized for detection and/or diagnosis of SARS-CoV-2 by FDA under an Emergency Use Authorization (EUA). This EUA will remain  in effect (meaning this test can be used) for the duration of the COVID-19 declaration under Section 564(b)(1) of the Act, 21 U.S.C.section 360bbb-3(b)(1), unless the authorization is terminated  or revoked sooner.       Influenza A by PCR NEGATIVE NEGATIVE Final   Influenza B by PCR NEGATIVE NEGATIVE Final    Comment: (NOTE) The Xpert Xpress SARS-CoV-2/FLU/RSV plus assay is intended as an aid in the diagnosis of influenza from Nasopharyngeal swab specimens and should not be used as a sole basis for treatment. Nasal washings and aspirates are unacceptable for Xpert Xpress SARS-CoV-2/FLU/RSV testing.  Fact Sheet for Patients: BloggerCourse.com  Fact Sheet for Healthcare Providers: SeriousBroker.it  This test  is not yet approved or cleared by the [place]  FDA and has been authorized for detection and/or diagnosis of SARS-CoV-2 by FDA under an Emergency Use Authorization (EUA). This EUA will remain in effect (meaning this test can be used) for the duration of the COVID-19 declaration under Section 564(b)(1) of the Act, 21 U.S.C. section 360bbb-3(b)(1), unless the authorization is terminated or revoked.     Resp Syncytial Virus by PCR NEGATIVE NEGATIVE Final    Comment: (NOTE) Fact Sheet for Patients: BloggerCourse.com  Fact Sheet for Healthcare  Providers: SeriousBroker.it  This test is not yet approved or cleared by the [place]  FDA and has been authorized for detection and/or diagnosis of SARS-CoV-2 by FDA under an Emergency Use Authorization (EUA). This EUA will remain in effect (meaning this test can be used) for the duration of the COVID-19 declaration under Section 564(b)(1) of the Act, 21 U.S.C. section 360bbb-3(b)(1), unless the authorization is terminated or revoked.  Performed at [facility], [address] [place], [place] [address]      Radiology Studies: CT ABDOMEN PELVIS W CONTRAST Result Date: 03/26/2024 CLINICAL DATA:  Abdominal pain, nausea, vomiting EXAM: CT ABDOMEN AND PELVIS WITH CONTRAST TECHNIQUE: Multidetector CT imaging of the abdomen and pelvis was performed using the standard protocol following bolus administration of intravenous contrast. RADIATION DOSE REDUCTION: This exam was performed according to the departmental dose-optimization program which includes automated exposure control, adjustment of the mA and/or kV according to patient size and/or use of iterative reconstruction technique. CONTRAST:  OMNIPAQUE  IOHEXOL  350 MG/ML SOLN COMPARISON:  02/10/2024 FINDINGS: Lower chest: No acute abnormality Hepatobiliary: Layering gallstones within the gallbladder, similar to prior study. No CT evidence of acute cholecystitis. No focal hepatic abnormality or biliary ductal dilatation. Pancreas: No focal abnormality or ductal dilatation. Spleen: No focal abnormality.  Normal size. Adrenals/Urinary Tract: 8.5 cm cyst in the left kidney is unchanged and appears benign. No follow-up imaging recommended. No stones or hydronephrosis. Adrenal glands and urinary bladder unremarkable. Stomach/Bowel: Stomach, large and small bowel grossly unremarkable. Vascular/Lymphatic: No evidence of aneurysm or adenopathy. Aortic atherosclerosis. Reproductive: No visible focal abnormality. Other:  No free fluid or free air. Musculoskeletal: No acute bony abnormality. IMPRESSION: No acute findings in the abdomen or pelvis. Cholelithiasis.  No CT evidence of acute cholecystitis. Aortic atherosclerosis. Electronically Signed   By: Franky Crease M.D.   On: 03/26/2024 00:33   DG Chest Portable 1 View Result Date: 03/26/2024 CLINICAL DATA:  Chest pain, shortness of breath, cough EXAM: PORTABLE CHEST 1 VIEW COMPARISON:  01/11/2024 FINDINGS: Cardiomegaly. No confluent airspace opacities or effusions. No edema. No acute bony abnormality. IMPRESSION: Cardiomegaly.  No active disease. Electronically Signed   By: Franky Crease M.D.   On: 03/26/2024 00:31    Scheduled Meds:  [START ON 03/27/2024] amLODipine   10 mg Oral Daily   atorvastatin   40 mg Oral Daily   gabapentin   300-600 mg Oral QHS   hydrochlorothiazide   12.5 mg Oral Daily   insulin  aspart  0-20 Units Subcutaneous TID WC   insulin  aspart  0-5 Units Subcutaneous QHS   [START ON 03/27/2024] losartan   100 mg Oral Daily   pantoprazole  (PROTONIX ) IV  40 mg Intravenous Q12H   [START ON 03/30/2024] Vitamin D  (Ergocalciferol )  50,000 Units Oral Q7 days   Continuous Infusions:  0.9 % NaCl with KCl 20 mEq / L 100 mL/hr at 03/26/24 0257   heparin  1,200 Units/hr (03/26/24 0730)     LOS: 0 days  MDM:  Patient is high risk for one or more organ failure.  They necessitate ongoing hospitalization for continued IV therapies and subsequent lab monitoring. Total time spent interpreting labs and vitals, reviewing the medical record, coordinating care amongst consultants and care team members, directly assessing and discussing care with the patient and/or family: 40 mins Laree Lock, MD [facility]  To contact the attending physician between 7A-7P please use Epic Chat. To contact the covering physician during after hours 7P-7A, please review Amion.  03/26/2024, 8:40 AM   *This document has been created with the assistance of dictation software.  Please excuse typographical errors. *

## 2024-03-26 NOTE — Assessment & Plan Note (Addendum)
-   The patient will be on IV PPI therapy.

## 2024-03-26 NOTE — Consult Note (Signed)
 [facility] SURGICAL CONSULTATION NOTE (initial) - cpt: 00746   HISTORY OF PRESENT ILLNESS (HPI):  63 y.o. male presented to [facility] ED overnight for evaluation of nausea and emesis. Patient reports since Sunday he has noticed generalized crampy abdominal pain with associated nausea and intermittent emesis as well. Interestingly, he has had quite significant belching episodes over this time including while in the room this morning. This has resulted in some chest pain and SOB when he has a prolonged episode of belching. No documented fever, chills, or bowel changes. He does have a recent admission for esophagitis and underwent EGD on 06/09 with Dr Jinny. Additionally, he has diagnosis of gastroparesis in 2023 which improved with Reglan  and baclofen ; he had similar symptoms at that time. He did have a gastric emptying study in 2024 which was normal. He is currently on Pantoprazole  but ran out prior to the onset of his symptoms and has not been able to pick up his refills. No previous known history of cholelithiasis. Work up in the ED revealed a leukocytosis to 14.0K (now 15.0K), Hgb to 15.9, slight AKI with sCr - 1.33 (1.02), total bilirubin to 2.8 which has been similar over the last few months, he did have mild elevation in high sensitivity troponin. Also had run of V-tach while in ED. He underwent CT Abdomen/Pelvis which was reassuring aside form cholelithiasis. He was admitted to the medicine service.   Surgery is consulted by Hospitalist physician Dr. Madison Peaches, MD in this context for evaluation and management of cholelithiasis.  PAST MEDICAL HISTORY (PMH):  Past Medical History:  Diagnosis Date   Diabetes mellitus without complication (HCC)    DKA, type 2 (HCC) 07/09/2022   Hypertension      PAST SURGICAL HISTORY (PSH):  Past Surgical History:  Procedure Laterality Date   COLONOSCOPY N/A 03/09/2022   Procedure: COLONOSCOPY;  Surgeon: Jinny Carmine, MD;  Location: [facility] ENDOSCOPY;   Service: Endoscopy;  Laterality: N/A;   ESOPHAGOGASTRODUODENOSCOPY N/A 03/09/2022   Procedure: ESOPHAGOGASTRODUODENOSCOPY (EGD);  Surgeon: Jinny Carmine, MD;  Location: [facility] ENDOSCOPY;  Service: Endoscopy;  Laterality: N/A;   ESOPHAGOGASTRODUODENOSCOPY N/A 01/13/2024   Procedure: EGD (ESOPHAGOGASTRODUODENOSCOPY);  Surgeon: Jinny Carmine, MD;  Location: [facility] ENDOSCOPY;  Service: Endoscopy;  Laterality: N/A;   ESOPHAGOGASTRODUODENOSCOPY N/A 02/24/2024   Procedure: EGD (ESOPHAGOGASTRODUODENOSCOPY);  Surgeon: Jinny Carmine, MD;  Location: [facility] ENDOSCOPY;  Service: Endoscopy;  Laterality: N/A;   HERNIA REPAIR       MEDICATIONS:  Prior to Admission medications   Medication Sig Start Date End Date Taking? Authorizing Provider  amLODipine  (NORVASC ) 10 MG tablet TAKE 1 TABLET BY MOUTH EVERY DAY 01/30/24  Yes Simmons-Robinson, Makiera, MD  aspirin  EC 81 MG tablet Take 1 tablet (81 mg total) by mouth daily. Swallow whole. 09/05/22  Yes Drubel, Manuelita, PA-C  atorvastatin  (LIPITOR) 40 MG tablet TAKE 1 TABLET BY MOUTH EVERY DAY 01/23/24  Yes Simmons-Robinson, Rockie, MD  calcium  carbonate (TUMS - DOSED IN MG ELEMENTAL CALCIUM ) 500 MG chewable tablet Chew 1 tablet (200 mg of elemental calcium  total) by mouth 3 (three) times daily with meals. Patient taking differently: Chew 200 mg of elemental calcium  by mouth 3 (three) times daily with meals. As needed 07/10/22  Yes Caleen Qualia, MD  gabapentin  (NEURONTIN ) 300 MG capsule Take 1-2 capsules (300-600 mg total) by mouth 3 (three) times daily. Patient taking differently: Take 300-600 mg by mouth at bedtime. 02/20/23  Yes Penumalli, Vikram R, MD  hydrochlorothiazide  (HYDRODIURIL ) 25 MG tablet Take 12.5 mg by mouth  daily. 01/27/24  Yes [provider]  insulin  glargine (LANTUS  SOLOSTAR) 100 UNIT/ML Solostar Pen INJECT 75 UNITS INTO THE SKIN AT BEDTIME. 10/24/23  Yes Simmons-Robinson, Makiera, MD  losartan  (COZAAR ) 100 MG tablet Take 1 tablet (100 mg total) by mouth  daily. 07/01/23  Yes Simmons-Robinson, Rockie, MD  metFORMIN  (GLUCOPHAGE -XR) 500 MG 24 hr tablet Take 500 mg by mouth 2 (two) times daily with a meal.   Yes [provider]  pantoprazole  (PROTONIX ) 40 MG tablet Take 2 tablets (80 mg total) by mouth 2 (two) times daily for 60 days, THEN 1 tablet (40 mg total) 2 (two) times daily. 01/13/24 05/12/24 Yes Von Bellis, MD  senna-docusate (SENOKOT-S) 8.6-50 MG tablet Take 1 tablet by mouth daily as needed for mild constipation. 01/01/24  Yes [provider]  tadalafil  (CIALIS ) 20 MG tablet TAKE 1 TABLET BY MOUTH EVERY DAY AS NEEDED FOR ERECTILE DYSFUNCTION 05/13/23  Yes Penne Knee, MD  Vitamin D , Ergocalciferol , (DRISDOL ) 1.25 MG (50000 UNIT) CAPS capsule Take 1 capsule (50,000 Units total) by mouth every 7 (seven) days. 01/14/24 04/13/24 Yes Von Bellis, MD  Blood Glucose Monitoring Suppl [facility] VERIO) w/Device KIT Use to check blood sugar before meals and fasting, as as needed 10/31/22   Cyndi Shaver, PA-C  glucose blood test strip Use as instructed 10/31/22   Drubel, Shaver, PA-C  Insulin  Pen Needle 29G X 12.7MM MISC 1 Needle by Does not apply route at bedtime. 09/19/22   Simmons-Robinson, Rockie, MD     ALLERGIES:  Allergies  Allergen Reactions   Ace Inhibitors Cough     SOCIAL HISTORY:  Social History   Socioeconomic History   Marital status: Married    Spouse name: Priscella   Number of children: 2   Years of education: Not on file   Highest education level: Not on file  Occupational History   Occupation: disability  Tobacco Use   Smoking status: Never   Smokeless tobacco: Never  Vaping Use   Vaping status: Never Used  Substance and Sexual Activity   Alcohol use: Never   Drug use: Never   Sexual activity: Not Currently  Other Topics Concern   Not on file  Social History Narrative   Not on file   Social Drivers of Health   Financial Resource Strain: Low Risk  (06/19/2023)   Overall Financial  Resource Strain (CARDIA)    Difficulty of Paying Living Expenses: Not very hard  Food Insecurity: Patient Declined (03/26/2024)   Hunger Vital Sign    Worried About Running Out of Food in the Last Year: Patient declined    Ran Out of Food in the Last Year: Patient declined  Transportation Needs: Patient Declined (03/26/2024)   PRAPARE - Administrator, Civil Service (Medical): Patient declined    Lack of Transportation (Non-Medical): Patient declined  Physical Activity: Inactive (06/19/2023)   Exercise Vital Sign    Days of Exercise per Week: 0 days    Minutes of Exercise per Session: 0 min  Stress: No Stress Concern Present (06/19/2023)   [employer] of Occupational Health - Occupational Stress Questionnaire    Feeling of Stress : Not at all  Social Connections: Patient Declined (03/26/2024)   Social Connection and Isolation Panel    Frequency of Communication with Friends and Family: Patient declined    Frequency of Social Gatherings with Friends and Family: Patient declined    Attends Religious Services: Patient declined    Database administrator or Organizations: Patient  declined    Attends Banker Meetings: Patient declined    Marital Status: Patient declined  Intimate Partner Violence: Patient Declined (03/26/2024)   Humiliation, Afraid, Rape, and Kick questionnaire    Fear of Current or Ex-Partner: Patient declined    Emotionally Abused: Patient declined    Physically Abused: Patient declined    Sexually Abused: Patient declined     FAMILY HISTORY:  Family History  Problem Relation Age of Onset   Diabetes Mother    Hypertension Mother    Seizures Mother    Hypertension Father    Colon cancer Father       REVIEW OF SYSTEMS:  Review of Systems  Constitutional:  Negative for chills and fever.  HENT:  Negative for congestion and sore throat.   Cardiovascular:  Negative for chest pain and palpitations.  Gastrointestinal:  Positive for  abdominal pain, nausea and vomiting. Negative for constipation and diarrhea.  Genitourinary:  Negative for dysuria and urgency.  All other systems reviewed and are negative.   VITAL SIGNS:  Temp:  [97.3 F (36.3 C)-98.5 F (36.9 C)] 97.8 F (36.6 C) (08/21 0753) Pulse Rate:  [89-120] 102 (08/21 0753) Resp:  [13-20] 20 (08/21 0753) BP: (132-203)/(71-106) 158/85 (08/21 0753) SpO2:  [92 %-100 %] 100 % (08/21 0753) Weight:  [884.7 kg] 115.2 kg (08/20 2022)     Height: 5' 6 (167.6 cm) Weight: 115.2 kg     INTAKE/OUTPUT:  08/20 0701 - 08/21 0700 In: 2000 [IV Piggyback:2000] Out: -   PHYSICAL EXAM:  Physical Exam Vitals and nursing note reviewed. Exam conducted with a chaperone present.  Constitutional:      General: He is not in acute distress.    Appearance: Normal appearance. He is obese. He is not ill-appearing.     Comments: Patient resting in bed, NAD. He is quite frequently belching   HENT:     Head: Normocephalic and atraumatic.  Eyes:     General: No scleral icterus.    Conjunctiva/sclera: Conjunctivae normal.  Cardiovascular:     Rate and Rhythm: Normal rate.     Pulses: Normal pulses.     Heart sounds: No murmur heard. Pulmonary:     Effort: Pulmonary effort is normal. No respiratory distress.  Abdominal:     General: Abdomen is protuberant. There is no distension.     Palpations: Abdomen is soft.     Tenderness: There is generalized abdominal tenderness. There is no guarding or rebound. Negative signs include Murphy's sign.     Comments: Abdomen is soft, he has somewhat generalized soreness, non-distended, no rebound/guarding.   Genitourinary:    Comments: Deferred Skin:    General: Skin is warm and dry.  Neurological:     General: No focal deficit present.     Mental Status: He is alert and oriented to person, place, and time.  Psychiatric:        Mood and Affect: Mood normal.        Behavior: Behavior normal.      Labs:     Latest Ref Rng & Units  03/26/2024    4:19 AM 03/25/2024    8:33 PM 01/13/2024    2:51 PM  CBC  WBC 4.0 - 10.5 K/uL 15.0  14.0  10.0   Hemoglobin 13.0 - 17.0 g/dL 85.1  84.0  84.6   Hematocrit 39.0 - 52.0 % 43.5  45.9  44.9   Platelets 150 - 400 K/uL 289  364  286  Latest Ref Rng & Units 03/26/2024    4:19 AM 03/25/2024    8:33 PM 01/13/2024    2:51 PM  CMP  Glucose 70 - 99 mg/dL 727  670  839   BUN 8 - 23 mg/dL 23  26  11    Creatinine 0.61 - 1.24 mg/dL 8.97  8.66  8.98   Sodium 135 - 145 mmol/L 138  135  137   Potassium 3.5 - 5.1 mmol/L 3.7  3.6  3.6   Chloride 98 - 111 mmol/L 100  92  100   CO2 22 - 32 mmol/L 29  24  30    Calcium  8.9 - 10.3 mg/dL 8.0  8.8  8.0   Total Protein 6.5 - 8.1 g/dL  7.2  6.2   Total Bilirubin 0.0 - 1.2 mg/dL  2.8  3.1   Alkaline Phos 38 - 126 U/L  96  74   AST 15 - 41 U/L  25  25   ALT 0 - 44 U/L  16  18      Imaging studies:   CT Abdomen/Pelvis (03/26/2024) personally reviewed with cholelithiasis without evidence to suggest cholecystitis, and radiologist report reviewed below:  IMPRESSION: No acute findings in the abdomen or pelvis.   Cholelithiasis.  No CT evidence of acute cholecystitis.   Aortic atherosclerosis.   Assessment/Plan:  63 y.o. male presenting with generalized abdominal pain, nausea, emesis, and frequent belching found to have known cholelithiasis without evidence of cholecystitis   - Clinically presentation does not appear consistent with cholecystitis and there are no radiographic changes to suggest such. He has stable cholelithiasis dating back to imaging from 2023. We will order HIDA however to definitively rule this out.   - He does have a history of similar presentation in 2023 with significant belching and thought to potentially have gastroparesis. He was started on Reglan  at that time which improved his symptoms. Gastric empty study was negative in 2024. I do wonder if this may be similar to presentation at that time and he would benefit again  from initiation of Reglan ?   - For now, NPO for planned HIDA - No need for surgical intervention currently - Hold narcotic pain medications   - Monitor abdominal examination - Appreciate cardiology consultation  - Further management per primary service; we will follow    All of the above findings and recommendations were discussed with the patient, and all of patient's questions were answered to his expressed satisfaction.  Thank you for the opportunity to participate in this patient's care.   -- Arthea Platt, PA-C [facility] 03/26/2024, 10:59 AM M-F: 7am - 4pm

## 2024-03-26 NOTE — Assessment & Plan Note (Addendum)
-   Initial troponin was 48 and later 72. - Will place the patient on IV heparin  for now for potential developing NSTEMI. - We will continue statin therapy and hold off aspirin  given his recurrent nausea and vomiting. - ARB therapy will be given. - 2D echo and cardiology consult to be obtained. - I notified [facility] group about the patient.

## 2024-03-26 NOTE — H&P (Signed)
 [place]   PATIENT NAME: Corey Petersen    MR#:  [id number]  DATE OF BIRTH:  April 10, 1961  DATE OF ADMISSION:  03/25/2024  PRIMARY CARE PHYSICIAN: Sharma Coyer, MD   Patient is coming from: Home  REQUESTING/REFERRING PHYSICIAN: Ward, Josette SAILOR, DO  CHIEF COMPLAINT:   Chief Complaint  Patient presents with   Nausea   Emesis    HISTORY OF PRESENT ILLNESS:  Corey Petersen is a 63 y.o. African-American male with medical history significant for type diabetes mellitus and essential hypertension, who presented to the emergency room with acute onset of recurrent nausea and vomiting with associated chest discomfort as well as congestion and productive cough.  He admitted to abdominal cramping and denied any diarrhea or melena or bright red bleeding per rectum.  No bilious vomitus or hematemesis.  No dysuria, oliguria or hematuria or flank pain.  He has been feeling diffuse cramps.  He admitted to flatulence with need to burp.  No fever or chills.  ED Course: When he came to the ER, BP was 132/92 with heart rate of 120 and temperature 97.3.  Labs revealed a VBG with pH 7.44 and HCO3 35.3.  Chloride was low at 92 and blood glucose was elevated at 329 with a BUN of 26 and creatinine 1.33 above previous levels.  Calcium  was 8.8 and anion gap 19.  LFTs showed total bili of 2.8 and albumin 3.3.  CBC showed leukocytosis of 14.  Beta hydroxybutyrate 0.69 anion gap was 19 but CO2 was 24. EKG as reviewed by me : EKG showed sinus tachycardia with a rate of 120 with PACs and aberrant conduction, LVH with repolarization abnormality and T wave inversion laterally. Imaging: Portable chest x-ray showed cardiomegaly with no acute cardiopulmonary disease. Abdominal pelvic CT scan revealed no acute abnormalities.  It shows cholelithiasis without cholecystitis.  The patient was given 2 L bolus of IV normal saline, for improvement p.o. potassium chloride , 40 mg of IV Protonix , 4 mg of IV Zofran   and 4 mg of IV morphine  sulfate, 100 mg of p.o. Cozaar  10 mg of IV hydralazine  and 10 mg of p.o. Norvasc  as well as 30 mL p.o. Maalox.  He will be admitted to the medical telemetry observation bed for further evaluation and management. PAST MEDICAL HISTORY:   Past Medical History:  Diagnosis Date   Diabetes mellitus without complication (HCC)    DKA, type 2 (HCC) 07/09/2022   Hypertension     PAST SURGICAL HISTORY:   Past Surgical History:  Procedure Laterality Date   COLONOSCOPY N/A 03/09/2022   Procedure: COLONOSCOPY;  Surgeon: Jinny Carmine, MD;  Location: [facility] ENDOSCOPY;  Service: Endoscopy;  Laterality: N/A;   ESOPHAGOGASTRODUODENOSCOPY N/A 03/09/2022   Procedure: ESOPHAGOGASTRODUODENOSCOPY (EGD);  Surgeon: Jinny Carmine, MD;  Location: [facility] ENDOSCOPY;  Service: Endoscopy;  Laterality: N/A;   ESOPHAGOGASTRODUODENOSCOPY N/A 01/13/2024   Procedure: EGD (ESOPHAGOGASTRODUODENOSCOPY);  Surgeon: Jinny Carmine, MD;  Location[facility] ENDOSCOPY;  Service: Endoscopy;  Laterality: N/A;   ESOPHAGOGASTRODUODENOSCOPY N/A 02/24/2024   Procedure: EGD (ESOPHAGOGASTRODUODENOSCOPY);  Surgeon: Jinny Carmine, MD;  Location: [facility] ENDOSCOPY;  Service: Endoscopy;  Laterality: N/A;   HERNIA REPAIR      SOCIAL HISTORY:   Social History   Tobacco Use   Smoking status: Never   Smokeless tobacco: Never  Substance Use Topics   Alcohol use: Never    FAMILY HISTORY:   Family History  Problem Relation Age of Onset   Diabetes Mother    Hypertension Mother  Seizures Mother    Hypertension Father    Colon cancer Father     DRUG ALLERGIES:   Allergies  Allergen Reactions   Ace Inhibitors Cough    REVIEW OF SYSTEMS:   ROS As per history of present illness. All pertinent systems were reviewed above. Constitutional, HEENT, cardiovascular, respiratory, GI, GU, musculoskeletal, neuro, psychiatric, endocrine, integumentary and hematologic systems were reviewed and are otherwise negative/unremarkable except  for positive findings mentioned above in the HPI.   MEDICATIONS AT HOME:   Prior to Admission medications   Medication Sig Start Date End Date Taking? Authorizing Provider  amLODipine  (NORVASC ) 10 MG tablet TAKE 1 TABLET BY MOUTH EVERY DAY 01/30/24  Yes Simmons-Robinson, Makiera, MD  aspirin  EC 81 MG tablet Take 1 tablet (81 mg total) by mouth daily. Swallow whole. 09/05/22  Yes Drubel, Manuelita, PA-C  atorvastatin  (LIPITOR) 40 MG tablet TAKE 1 TABLET BY MOUTH EVERY DAY 01/23/24  Yes Simmons-Robinson, Rockie, MD  calcium  carbonate (TUMS - DOSED IN MG ELEMENTAL CALCIUM ) 500 MG chewable tablet Chew 1 tablet (200 mg of elemental calcium  total) by mouth 3 (three) times daily with meals. Patient taking differently: Chew 200 mg of elemental calcium  by mouth 3 (three) times daily with meals. As needed 07/10/22  Yes Caleen Qualia, MD  gabapentin  (NEURONTIN ) 300 MG capsule Take 1-2 capsules (300-600 mg total) by mouth 3 (three) times daily. Patient taking differently: Take 300-600 mg by mouth at bedtime. 02/20/23  Yes Penumalli, Vikram R, MD  hydrochlorothiazide  (HYDRODIURIL ) 25 MG tablet Take 12.5 mg by mouth daily. 01/27/24  Yes [provider]  insulin  glargine (LANTUS  SOLOSTAR) 100 UNIT/ML Solostar Pen INJECT 75 UNITS INTO THE SKIN AT BEDTIME. 10/24/23  Yes Simmons-Robinson, Makiera, MD  losartan  (COZAAR ) 100 MG tablet Take 1 tablet (100 mg total) by mouth daily. 07/01/23  Yes Simmons-Robinson, Rockie, MD  metFORMIN  (GLUCOPHAGE -XR) 500 MG 24 hr tablet Take 500 mg by mouth 2 (two) times daily with a meal.   Yes [provider]  pantoprazole  (PROTONIX ) 40 MG tablet Take 2 tablets (80 mg total) by mouth 2 (two) times daily for 60 days, THEN 1 tablet (40 mg total) 2 (two) times daily. 01/13/24 05/12/24 Yes Von Bellis, MD  senna-docusate (SENOKOT-S) 8.6-50 MG tablet Take 1 tablet by mouth daily as needed for mild constipation. 01/01/24  Yes [provider]  tadalafil  (CIALIS ) 20 MG  tablet TAKE 1 TABLET BY MOUTH EVERY DAY AS NEEDED FOR ERECTILE DYSFUNCTION 05/13/23  Yes Penne Knee, MD  Vitamin D , Ergocalciferol , (DRISDOL ) 1.25 MG (50000 UNIT) CAPS capsule Take 1 capsule (50,000 Units total) by mouth every 7 (seven) days. 01/14/24 04/13/24 Yes Von Bellis, MD  Blood Glucose Monitoring Suppl [facility] VERIO) w/Device KIT Use to check blood sugar before meals and fasting, as as needed 10/31/22   Cyndi Manuelita, PA-C  glucose blood test strip Use as instructed 10/31/22   Drubel, Manuelita, PA-C  Insulin  Pen Needle 29G X 12.7MM MISC 1 Needle by Does not apply route at bedtime. 09/19/22   Simmons-Robinson, Makiera, MD      VITAL SIGNS:  Blood pressure (!) 157/77, pulse 94, temperature 98.3 F (36.8 C), temperature source Oral, resp. rate 17, height 5' 6 (1.676 m), weight 115.2 kg, SpO2 94%.  PHYSICAL EXAMINATION:  Physical Exam  GENERAL:  63 y.o.-year-old African American male patient lying in the bed with no acute distress.  EYES: Pupils equal, round, reactive to light and accommodation. No scleral icterus. Extraocular muscles intact.  HEENT: Head atraumatic, normocephalic.  Oropharynx and nasopharynx clear.  NECK:  Supple, no jugular venous distention. No thyroid  enlargement, no tenderness.  LUNGS: Normal breath sounds bilaterally, no wheezing, rales,rhonchi or crepitation. No use of accessory muscles of respiration.  CARDIOVASCULAR: Regular rate and rhythm, S1, S2 normal. No murmurs, rubs, or gallops.  ABDOMEN: Soft, nondistended, nontender. Bowel sounds present. No organomegaly or mass.  EXTREMITIES: No pedal edema, cyanosis, or clubbing.  NEUROLOGIC: Cranial nerves II through XII are intact. Muscle strength 5/5 in all extremities. Sensation intact. Gait not checked.  PSYCHIATRIC: The patient is alert and oriented x 3.  Normal affect and good eye contact. SKIN: No obvious rash, lesion, or ulcer.   LABORATORY PANEL:   CBC Recent Labs  Lab 03/26/24 0419  WBC 15.0*   HGB 14.8  HCT 43.5  PLT 289   ------------------------------------------------------------------------------------------------------------------  Chemistries  Recent Labs  Lab 03/25/24 2033 03/26/24 0419  NA 135 138  K 3.6 3.7  CL 92* 100  CO2 24 29  GLUCOSE 329* 272*  BUN 26* 23  CREATININE 1.33* 1.02  CALCIUM  8.8* 8.0*  MG 2.0  --   AST 25  --   ALT 16  --   ALKPHOS 96  --   BILITOT 2.8*  --    ------------------------------------------------------------------------------------------------------------------  Cardiac Enzymes No results for input(s): TROPONINI in the last 168 hours. ------------------------------------------------------------------------------------------------------------------  RADIOLOGY:  CT ABDOMEN PELVIS W CONTRAST Result Date: 03/26/2024 CLINICAL DATA:  Abdominal pain, nausea, vomiting EXAM: CT ABDOMEN AND PELVIS WITH CONTRAST TECHNIQUE: Multidetector CT imaging of the abdomen and pelvis was performed using the standard protocol following bolus administration of intravenous contrast. RADIATION DOSE REDUCTION: This exam was performed according to the departmental dose-optimization program which includes automated exposure control, adjustment of the mA and/or kV according to patient size and/or use of iterative reconstruction technique. CONTRAST:  OMNIPAQUE  IOHEXOL  350 MG/ML SOLN COMPARISON:  02/10/2024 FINDINGS: Lower chest: No acute abnormality Hepatobiliary: Layering gallstones within the gallbladder, similar to prior study. No CT evidence of acute cholecystitis. No focal hepatic abnormality or biliary ductal dilatation. Pancreas: No focal abnormality or ductal dilatation. Spleen: No focal abnormality.  Normal size. Adrenals/Urinary Tract: 8.5 cm cyst in the left kidney is unchanged and appears benign. No follow-up imaging recommended. No stones or hydronephrosis. Adrenal glands and urinary bladder unremarkable. Stomach/Bowel: Stomach, large and  small bowel grossly unremarkable. Vascular/Lymphatic: No evidence of aneurysm or adenopathy. Aortic atherosclerosis. Reproductive: No visible focal abnormality. Other: No free fluid or free air. Musculoskeletal: No acute bony abnormality. IMPRESSION: No acute findings in the abdomen or pelvis. Cholelithiasis.  No CT evidence of acute cholecystitis. Aortic atherosclerosis. Electronically Signed   By: Franky Crease M.D.   On: 03/26/2024 00:33   DG Chest Portable 1 View Result Date: 03/26/2024 CLINICAL DATA:  Chest pain, shortness of breath, cough EXAM: PORTABLE CHEST 1 VIEW COMPARISON:  01/11/2024 FINDINGS: Cardiomegaly. No confluent airspace opacities or effusions. No edema. No acute bony abnormality. IMPRESSION: Cardiomegaly.  No active disease. Electronically Signed   By: Franky Crease M.D.   On: 03/26/2024 00:31      IMPRESSION AND PLAN:  Assessment and Plan: Intractable nausea and vomiting - The patient will be placed on a medical observation telemetry bed. - Will continue hydration with IV normal saline. - As needed antiemetics will be provided. - IV PPI therapy will be given.  Chest pain - Initial troponin was 48 and later 72. - Will place the patient on IV heparin  for now for potential developing NSTEMI. -  We will continue statin therapy and hold off aspirin  given his recurrent nausea and vomiting. - ARB therapy will be given. - 2D echo and cardiology consult to be obtained. - I notified [facility] group about the patient.  Type 2 diabetes mellitus with peripheral neuropathy (HCC) - The patient will be placed on supplemental coverage with NovoLog . - Will continue basal coverage. - Will hold off metformin . - Continue Neurontin .  Dyslipidemia - Continue statin therapy.  Essential hypertension - Will continue antihypertensive therapy.  GERD (gastroesophageal reflux disease) - The patient will be on IV PPI therapy.       DVT prophylaxis: IV heparin . Advanced Care Planning:  Code  Status: full code. Family Communication:  The plan of care was discussed in details with the patient (and family). I answered all questions. The patient agreed to proceed with the above mentioned plan. Further management will depend upon hospital course. Disposition Plan: Back to previous home environment Consults called: Cardiology All the records are reviewed and case discussed with ED provider.  Status is: Observation   I certify that at the time of admission, it is my clinical judgment that the patient will require  hospital care extending LESS than 2 midnights.                            Dispo: The patient is from: Home              Anticipated d/c is to: Home              Patient currently is not medically stable to d/c.              Difficult to place patient: No  Madison DELENA Peaches M.D on 03/26/2024 at 6:22 AM  [facility]   From 7 PM-7 AM, contact night-coverage [email or web address]  CC: Primary care physician; Sharma Coyer, MD

## 2024-03-26 NOTE — ED Notes (Signed)
 CCMD called to initiate cardiac monitoring.

## 2024-03-26 NOTE — Assessment & Plan Note (Signed)
-   The patient will be placed on supplemental coverage with NovoLog . - Will continue basal coverage. - Will hold off metformin . - Continue Neurontin .

## 2024-03-27 DIAGNOSIS — R112 Nausea with vomiting, unspecified: Secondary | ICD-10-CM | POA: Diagnosis not present

## 2024-03-27 DIAGNOSIS — Z794 Long term (current) use of insulin: Secondary | ICD-10-CM

## 2024-03-27 DIAGNOSIS — E119 Type 2 diabetes mellitus without complications: Secondary | ICD-10-CM | POA: Diagnosis not present

## 2024-03-27 DIAGNOSIS — K3184 Gastroparesis: Secondary | ICD-10-CM | POA: Diagnosis not present

## 2024-03-27 LAB — CBC
HCT: 39.3 % (ref 39.0–52.0)
Hemoglobin: 13.5 g/dL (ref 13.0–17.0)
MCH: 32.4 pg (ref 26.0–34.0)
MCHC: 34.4 g/dL (ref 30.0–36.0)
MCV: 94.2 fL (ref 80.0–100.0)
Platelets: 245 K/uL (ref 150–400)
RBC: 4.17 MIL/uL — ABNORMAL LOW (ref 4.22–5.81)
RDW: 12.1 % (ref 11.5–15.5)
WBC: 7.5 K/uL (ref 4.0–10.5)
nRBC: 0 % (ref 0.0–0.2)

## 2024-03-27 LAB — LIPID PANEL
Cholesterol: 125 mg/dL (ref 0–200)
HDL: 37 mg/dL — ABNORMAL LOW (ref >40)
LDL Cholesterol: 75 mg/dL (ref 0–99)
Total CHOL/HDL Ratio: 3.4 ratio
Triglycerides: 67 mg/dL (ref <150)
VLDL: 13 mg/dL (ref 0–40)

## 2024-03-27 LAB — BASIC METABOLIC PANEL WITH GFR
Anion gap: 8 (ref 5–15)
BUN: 13 mg/dL (ref 8–23)
CO2: 29 mmol/L (ref 22–32)
Calcium: 7.8 mg/dL — ABNORMAL LOW (ref 8.9–10.3)
Chloride: 102 mmol/L (ref 98–111)
Creatinine, Ser: 0.95 mg/dL (ref 0.61–1.24)
GFR, Estimated: >60 mL/min (ref >60)
Glucose, Bld: 119 mg/dL — ABNORMAL HIGH (ref 70–99)
Potassium: 3.6 mmol/L (ref 3.5–5.1)
Sodium: 139 mmol/L (ref 135–145)

## 2024-03-27 LAB — GLUCOSE, CAPILLARY
Glucose-Capillary: 120 mg/dL — ABNORMAL HIGH (ref 70–99)
Glucose-Capillary: 173 mg/dL — ABNORMAL HIGH (ref 70–99)
Glucose-Capillary: 91 mg/dL (ref 70–99)
Glucose-Capillary: 170 mg/dL — ABNORMAL HIGH (ref 70–99)

## 2024-03-27 MED ORDER — ENSURE PLUS HIGH PROTEIN PO LIQD
237.0000 mL | Freq: Two times a day (BID) | ORAL | Status: DC
  Administered 2024-03-27 – 2024-03-28 (×2): 237 mL via ORAL

## 2024-03-27 MED ORDER — ADULT MULTIVITAMIN W/MINERALS CH
1.0000 | ORAL_TABLET | Freq: Every day | ORAL | Status: DC
  Administered 2024-03-27 – 2024-03-28 (×2): 1 via ORAL
  Filled 2024-03-27 (×2): qty 1

## 2024-03-27 MED ORDER — METOCLOPRAMIDE HCL 5 MG/ML IJ SOLN: 5.0000 mg | Freq: Four times a day (QID) | INTRAMUSCULAR | Status: DC | PRN

## 2024-03-27 NOTE — Progress Notes (Signed)
 PROGRESS NOTE    Corey Petersen  FMW:[id number] DOB: 1961-05-16 DOA: 03/25/2024 PCP: Sharma Coyer, MD  Chief Complaint  Patient presents with   Nausea   Emesis    Hospital Course:  Corey Petersen is a 63 y.o. African-American male with medical history diabetes mellitus and essential hypertension presented due to acute onset recurrent nausea and vomiting with associated chest discomfort as well as congestion and productive cough. Seen by Cardiology, elevated troponin likely demand ischemia. Seen by Surgery, HIDA negative - no evidence of cholecystitis. Suspect symptoms due to esophagitis, on IV PPI Hospital course as below  Subjective: Patient was examined at the bedside, Continues to have nausea, belching and abdominal discomfort On IV PPI, consulted GI for further recs   Objective: Vitals:   03/26/24 2019 03/27/24 0352 03/27/24 0354 03/27/24 0833  BP: (!) 152/92 (!) 177/83 (!) 176/82 138/84  Pulse: 87 69 68 75  Resp: 19 18  17   Temp: 98.6 F (37 C) 98.4 F (36.9 C)  98.1 F (36.7 C)  TempSrc: Oral     SpO2: 100% 100% 100% 96%  Weight:      Height:        Intake/Output Summary (Last 24 hours) at 03/27/2024 1623 Last data filed at 03/27/2024 1300 Gross per 24 hour  Intake 1510.74 ml  Output 1800 ml  Net -289.26 ml   Filed Weights   03/25/24 2022  Weight: 115.2 kg    Examination: GENERAL:  63 y.o.-year-old African American male patient lying in the bed with no acute distress.  EYES: Pupils equal, round, reactive to light and accommodation. No scleral icterus. Extraocular muscles intact.  HEENT: Head atraumatic, normocephalic. Oropharynx and nasopharynx clear.  NECK:  Supple, no jugular venous distention. No thyroid  enlargement, no tenderness.  LUNGS: Normal breath sounds bilaterally, no wheezing, rales,rhonchi or crepitation. No use of accessory muscles of respiration.  CARDIOVASCULAR: Regular rate and rhythm, S1, S2 normal. No murmurs, rubs, or gallops.   ABDOMEN: Soft, nondistended, nontender. Bowel sounds present. No organomegaly or mass.  EXTREMITIES: No pedal edema, cyanosis, or clubbing.  NEUROLOGIC: Cranial nerves II through XII are intact. Muscle strength 5/5 in all extremities. Sensation intact. Gait not checked.  PSYCHIATRIC: The patient is alert and oriented x 3.  Normal affect and good eye contact. SKIN: No obvious rash, lesion, or ulcer  Assessment & Plan:  Active Problems:   Nausea and vomiting   Chest pain   Type 2 diabetes mellitus with peripheral neuropathy (HCC)   Dyslipidemia   Hypertension   Essential hypertension   GERD (gastroesophageal reflux disease)   Calculus of gallbladder without cholecystitis without obstruction   Generalized abdominal cramping   Nonsustained ventricular tachycardia (HCC)   Elevated troponin   LVH (left ventricular hypertrophy)  Intractable nausea and vomiting Suspect 2/2 gastroparesis  Hx Esophagitis and H.pylori - was recently discharged 06/25, EGD with severe esophagitis, Reports ran out of medication PPI - Leukocytosis, suspect if reactive - Abdominal pelvic CT scan revealed no acute abnormalities.  It shows cholelithiasis without cholecystitis - IV PPI bid, Reglan  prn, discontinue IV fluids - advance to regular diet - Seen by GI, appreciate recs. Long term PPI bid, no indiocation for repeat endoscopy - antiemetics prn, avoid opioids   Elevated Troponin likely 2/2 demand ischemia - Troponin flat, was on IV heparin , discontinued - Echo with no focal wall motion abnormalities, G1DD - on asa 81mg  - Follow up with Cardiology outpatient, appreciate recs inpatient  Cholelithiasis - Seen by Surgery, appreciate recs.  HIDA scan negative   Type 2 diabetes mellitus with peripheral neuropathy (HCC) - HbA1c 7.2. Home dose 36units Lantus  bid - Lantus  20units daily, SSI, will titrate as po intake improves - hold off metformin , Continue Neurontin   Dyslipidemia - Continue statin  therapy  Hypertensive urgency - resolved Essential hypertension - On Amlodipine  10mg , hydrochlorothiazide  12.5mg , Losartan  100mg  - Add Coreg  12.5mg  bid   GERD (gastroesophageal reflux disease) - The patient will be on IV PPI therapy  Obesity -- BMI 41 -- Lifestyle modification  DVT prophylaxis: Lovenox  SQ   Code Status: Full Code Disposition:  TBD  Consultants:  Cardiology General Surgery Gastroenterology  Procedures:  None  Antimicrobials:  Anti-infectives (From admission, onward)    None       Data Reviewed: I have personally reviewed following labs and imaging studies CBC: Recent Labs  Lab 03/25/24 2033 03/26/24 0419 03/27/24 0653  WBC 14.0* 15.0* 7.5  HGB 15.9 14.8 13.5  HCT 45.9 43.5 39.3  MCV 92.4 94.0 94.2  PLT 364 289 245   Basic Metabolic Panel: Recent Labs  Lab 03/25/24 2033 03/26/24 0419 03/27/24 0653  NA 135 138 139  K 3.6 3.7 3.6  CL 92* 100 102  CO2 24 29 29   GLUCOSE 329* 272* 119*  BUN 26* 23 13  CREATININE 1.33* 1.02 0.95  CALCIUM  8.8* 8.0* 7.8*  MG 2.0  --   --    GFR: Estimated Creatinine Clearance: 95 mL/min (by C-G formula based on SCr of 0.95 mg/dL). Liver Function Tests: Recent Labs  Lab 03/25/24 2033  AST 25  ALT 16  ALKPHOS 96  BILITOT 2.8*  PROT 7.2  ALBUMIN 3.3*   CBG: Recent Labs  Lab 03/26/24 1232 03/26/24 1616 03/26/24 2020 03/27/24 0834 03/27/24 1231  GLUCAP 159* 104* 183* 120* 173*    Recent Results (from the past 240 hours)  Resp panel by RT-PCR (RSV, Flu A&B, Covid) Anterior Nasal Swab     Status: None   Collection Time: 03/26/24 12:10 AM   Specimen: Anterior Nasal Swab  Result Value Ref Range Status   SARS Coronavirus 2 by RT PCR NEGATIVE NEGATIVE Final    Comment: (NOTE) SARS-CoV-2 target nucleic acids are NOT DETECTED.  The SARS-CoV-2 RNA is generally detectable in upper respiratory specimens during the acute phase of infection. The lowest concentration of SARS-CoV-2 viral copies this  assay can detect is 138 copies/mL. A negative result does not preclude SARS-Cov-2 infection and should not be used as the sole basis for treatment or other patient management decisions. A negative result may occur with  improper specimen collection/handling, submission of specimen other than nasopharyngeal swab, presence of viral mutation(s) within the areas targeted by this assay, and inadequate number of viral copies(<138 copies/mL). A negative result must be combined with clinical observations, patient history, and epidemiological information. The expected result is Negative.  Fact Sheet for Patients:  BloggerCourse.com  Fact Sheet for Healthcare Providers:  SeriousBroker.it  This test is no t yet approved or cleared by the [place]  FDA and  has been authorized for detection and/or diagnosis of SARS-CoV-2 by FDA under an Emergency Use Authorization (EUA). This EUA will remain  in effect (meaning this test can be used) for the duration of the COVID-19 declaration under Section 564(b)(1) of the Act, 21 U.S.C.section 360bbb-3(b)(1), unless the authorization is terminated  or revoked sooner.       Influenza A by PCR NEGATIVE NEGATIVE Final   Influenza B by PCR NEGATIVE NEGATIVE Final  Comment: (NOTE) The Xpert Xpress SARS-CoV-2/FLU/RSV plus assay is intended as an aid in the diagnosis of influenza from Nasopharyngeal swab specimens and should not be used as a sole basis for treatment. Nasal washings and aspirates are unacceptable for Xpert Xpress SARS-CoV-2/FLU/RSV testing.  Fact Sheet for Patients: BloggerCourse.com  Fact Sheet for Healthcare Providers: SeriousBroker.it  This test is not yet approved or cleared by the [place]  FDA and has been authorized for detection and/or diagnosis of SARS-CoV-2 by FDA under an Emergency Use Authorization (EUA). This EUA will  remain in effect (meaning this test can be used) for the duration of the COVID-19 declaration under Section 564(b)(1) of the Act, 21 U.S.C. section 360bbb-3(b)(1), unless the authorization is terminated or revoked.     Resp Syncytial Virus by PCR NEGATIVE NEGATIVE Final    Comment: (NOTE) Fact Sheet for Patients: BloggerCourse.com  Fact Sheet for Healthcare Providers: SeriousBroker.it  This test is not yet approved or cleared by the [place]  FDA and has been authorized for detection and/or diagnosis of SARS-CoV-2 by FDA under an Emergency Use Authorization (EUA). This EUA will remain in effect (meaning this test can be used) for the duration of the COVID-19 declaration under Section 564(b)(1) of the Act, 21 U.S.C. section 360bbb-3(b)(1), unless the authorization is terminated or revoked.  Performed at [facility], [address] [place], [place] [address]      Radiology Studies: NM Hepatobiliary Liver Func Result Date: 03/26/2024 CLINICAL DATA:  Abdominal pain. Nausea and vomiting. Cholelithiasis. EXAM: NUCLEAR MEDICINE HEPATOBILIARY IMAGING TECHNIQUE: Sequential images of the abdomen were obtained out to 60 minutes following intravenous administration of radiopharmaceutical. RADIOPHARMACEUTICALS:  7.7 mCi Tc-34m  Choletec  IV COMPARISON:  None Available. FINDINGS: Prompt uptake and biliary excretion of activity by the liver is seen. Gallbladder activity is visualized, consistent with patency of cystic duct. Biliary activity passes into small bowel, consistent with patent common bile duct. IMPRESSION: Normal hepatobiliary scan, demonstrating patency of both the cystic and common bile ducts. Electronically Signed   By: Norleen DELENA Kil M.D.   On: 03/26/2024 16:04   ECHOCARDIOGRAM COMPLETE Result Date: 03/26/2024    ECHOCARDIOGRAM REPORT   Patient Name:   Corey Petersen Date of Exam: 03/26/2024 Medical Rec #:  [id number]       Height:       66.0 in Accession #:    [id number]     Weight:       254.0 lb Date of Birth:  March 23, 1961      BSA:          2.214 m Patient Age:    63 years       BP:           157/77 mmHg Patient Gender: M              HR:           84 bpm. Exam Location:  [facility] Procedure: 2D Echo, Color Doppler, Limited Color Doppler, Strain Analysis and            Intracardiac Opacification Agent (Both Spectral and Color Flow            Doppler were utilized during procedure). Indications:     NSTEMI I21.4  History:         Patient has prior history of Echocardiogram examinations, most                  recent 11/15/2022. Cardiomyopathy.  Sonographer:     Ashley McNeely-Sloane Referring  Phys:  [id number] [facility] [employer] [facility] Diagnosing Phys: Evalene Lunger MD  Sonographer Comments: Global longitudinal strain was attempted. IMPRESSIONS  1. Left ventricular ejection fraction, by estimation, is 60 to 65%. The left ventricle has normal function. The left ventricle has no regional wall motion abnormalities. There is severe left ventricular hypertrophy, no significant LVOT gradient. Near cavity obliteration in the mid to distal ventricle and apical region. Left ventricular diastolic parameters are consistent with Grade I diastolic dysfunction (impaired relaxation).  2. Right ventricular systolic function is normal. The right ventricular size is normal. Tricuspid regurgitation signal is inadequate for assessing PA pressure.  3. Left atrial size was mildly dilated.  4. The mitral valve is normal in structure. No evidence of mitral valve regurgitation. No evidence of mitral stenosis.  5. The aortic valve has an indeterminant number of cusps. Aortic valve regurgitation is not visualized. No aortic stenosis is present.  6. The inferior vena cava is normal in size with greater than 50% respiratory variability, suggesting right atrial pressure of 3 mmHg. FINDINGS  Left Ventricle: Left ventricular ejection fraction, by estimation, is 60 to 65%. The left  ventricle has normal function. The left ventricle has no regional wall motion abnormalities. Definity  contrast agent was given IV to delineate the left ventricular  endocardial borders. Strain was performed and the global longitudinal strain is indeterminate. The left ventricular internal cavity size was normal in size. There is severe left ventricular hypertrophy. Left ventricular diastolic parameters are consistent with Grade I diastolic dysfunction (impaired relaxation). Right Ventricle: The right ventricular size is normal. No increase in right ventricular wall thickness. Right ventricular systolic function is normal. Tricuspid regurgitation signal is inadequate for assessing PA pressure. Left Atrium: Left atrial size was mildly dilated. Right Atrium: Right atrial size was normal in size. Pericardium: There is no evidence of pericardial effusion. Mitral Valve: The mitral valve is normal in structure. No evidence of mitral valve regurgitation. No evidence of mitral valve stenosis. MV peak gradient, 3.2 mmHg. The mean mitral valve gradient is 1.0 mmHg. Tricuspid Valve: The tricuspid valve is normal in structure. Tricuspid valve regurgitation is not demonstrated. No evidence of tricuspid stenosis. Aortic Valve: The aortic valve has an indeterminant number of cusps. Aortic valve regurgitation is not visualized. No aortic stenosis is present. Aortic valve mean gradient measures 3.0 mmHg. Aortic valve peak gradient measures 6.2 mmHg. Aortic valve area, by VTI measures 2.10 cm. Pulmonic Valve: The pulmonic valve was normal in structure. Pulmonic valve regurgitation is not visualized. No evidence of pulmonic stenosis. Aorta: The aortic root is normal in size and structure. Venous: The inferior vena cava is normal in size with greater than 50% respiratory variability, suggesting right atrial pressure of 3 mmHg. IAS/Shunts: No atrial level shunt detected by color flow Doppler. Additional Comments: 3D was performed not  requiring image post processing on an independent workstation and was indeterminate.  LEFT VENTRICLE PLAX 2D LVIDd:         4.90 cm     Diastology LVIDs:         2.90 cm     LV e' medial:    4.19 cm/s LV PW:         1.60 cm     LV E/e' medial:  13.0 LV IVS:        1.80 cm     LV e' lateral:   6.15 cm/s LVOT diam:     1.70 cm     LV E/e' lateral: 8.9 LV SV:  43 LV SV Index:   20 LVOT Area:     2.27 cm  LV Volumes (MOD) LV vol d, MOD A2C: 81.9 ml LV vol d, MOD A4C: 75.1 ml LV vol s, MOD A2C: 18.3 ml LV vol s, MOD A4C: 18.9 ml LV SV MOD A2C:     63.6 ml LV SV MOD A4C:     75.1 ml LV SV MOD BP:      57.5 ml RIGHT VENTRICLE RV Basal diam:  4.30 cm RV Mid diam:    2.80 cm RV S prime:     15.40 cm/s TAPSE (M-mode): 3.2 cm LEFT ATRIUM             Index        RIGHT ATRIUM           Index LA diam:        4.20 cm 1.90 cm/m   RA Area:     16.40 cm LA Vol (A2C):   75.1 ml 33.93 ml/m  RA Volume:   41.10 ml  18.57 ml/m LA Vol (A4C):   50.2 ml 22.68 ml/m LA Biplane Vol: 63.6 ml 28.73 ml/m  AORTIC VALVE                    PULMONIC VALVE AV Area (Vmax):    2.29 cm     PV Vmax:        1.22 m/s AV Area (Vmean):   2.14 cm     PV Vmean:       81.100 cm/s AV Area (VTI):     2.10 cm     PV VTI:         0.238 m AV Vmax:           124.00 cm/s  PV Peak grad:   6.0 mmHg AV Vmean:          82.600 cm/s  PV Mean grad:   3.0 mmHg AV VTI:            0.206 m      RVOT Peak grad: 4 mmHg AV Peak Grad:      6.2 mmHg AV Mean Grad:      3.0 mmHg LVOT Vmax:         125.00 cm/s LVOT Vmean:        78.000 cm/s LVOT VTI:          0.191 m LVOT/AV VTI ratio: 0.93  AORTA Ao Root diam: 2.70 cm Ao Asc diam:  3.50 cm MITRAL VALVE MV Area (PHT): 4.31 cm    SHUNTS MV Area VTI:   2.34 cm    Systemic VTI:  0.19 m MV Peak grad:  3.2 mmHg    Systemic Diam: 1.70 cm MV Mean grad:  1.0 mmHg    Pulmonic VTI:  0.209 m MV Vmax:       0.89 m/s MV Vmean:      56.6 cm/s MV Decel Time: 176 msec MV E velocity: 54.50 cm/s MV A velocity: 83.30 cm/s MV E/A ratio:   0.65 Evalene Lunger MD Electronically signed by Evalene Lunger MD Signature Date/Time: 03/26/2024/3:59:03 PM    Final    CT ABDOMEN PELVIS W CONTRAST Result Date: 03/26/2024 CLINICAL DATA:  Abdominal pain, nausea, vomiting EXAM: CT ABDOMEN AND PELVIS WITH CONTRAST TECHNIQUE: Multidetector CT imaging of the abdomen and pelvis was performed using the standard protocol following bolus administration of intravenous contrast. RADIATION DOSE REDUCTION: This exam was performed according to the  departmental dose-optimization program which includes automated exposure control, adjustment of the mA and/or kV according to patient size and/or use of iterative reconstruction technique. CONTRAST:  OMNIPAQUE  IOHEXOL  350 MG/ML SOLN COMPARISON:  02/10/2024 FINDINGS: Lower chest: No acute abnormality Hepatobiliary: Layering gallstones within the gallbladder, similar to prior study. No CT evidence of acute cholecystitis. No focal hepatic abnormality or biliary ductal dilatation. Pancreas: No focal abnormality or ductal dilatation. Spleen: No focal abnormality.  Normal size. Adrenals/Urinary Tract: 8.5 cm cyst in the left kidney is unchanged and appears benign. No follow-up imaging recommended. No stones or hydronephrosis. Adrenal glands and urinary bladder unremarkable. Stomach/Bowel: Stomach, large and small bowel grossly unremarkable. Vascular/Lymphatic: No evidence of aneurysm or adenopathy. Aortic atherosclerosis. Reproductive: No visible focal abnormality. Other: No free fluid or free air. Musculoskeletal: No acute bony abnormality. IMPRESSION: No acute findings in the abdomen or pelvis. Cholelithiasis.  No CT evidence of acute cholecystitis. Aortic atherosclerosis. Electronically Signed   By: Franky Crease M.D.   On: 03/26/2024 00:33   DG Chest Portable 1 View Result Date: 03/26/2024 CLINICAL DATA:  Chest pain, shortness of breath, cough EXAM: PORTABLE CHEST 1 VIEW COMPARISON:  01/11/2024 FINDINGS: Cardiomegaly. No  confluent airspace opacities or effusions. No edema. No acute bony abnormality. IMPRESSION: Cardiomegaly.  No active disease. Electronically Signed   By: Franky Crease M.D.   On: 03/26/2024 00:31    Scheduled Meds:  amLODipine   10 mg Oral Daily   aspirin   81 mg Oral Daily   atorvastatin   40 mg Oral Daily   carvedilol   12.5 mg Oral BID WC   enoxaparin  (LOVENOX ) injection  40 mg Subcutaneous Q24H   feeding supplement  237 mL Oral BID BM   gabapentin   300-600 mg Oral QHS   hydrochlorothiazide   12.5 mg Oral Daily   insulin  aspart  0-20 Units Subcutaneous TID WC   insulin  aspart  0-5 Units Subcutaneous QHS   insulin  glargine  20 Units Subcutaneous Daily   losartan   100 mg Oral Daily   multivitamin with minerals  1 tablet Oral Daily   pantoprazole  (PROTONIX ) IV  40 mg Intravenous Q12H   [START ON 03/30/2024] Vitamin D  (Ergocalciferol )  50,000 Units Oral Q7 days   Continuous Infusions:  lactated ringers  50 mL/hr at 03/27/24 0405     LOS: 0 days  MDM: Patient is high risk for one or more organ failure.  They necessitate ongoing hospitalization for continued IV therapies and subsequent lab monitoring. Total time spent interpreting labs and vitals, reviewing the medical record, coordinating care amongst consultants and care team members, directly assessing and discussing care with the patient and/or family: 40 mins Laree Lock, MD [facility]  To contact the attending physician between 7A-7P please use Epic Chat. To contact the covering physician during after hours 7P-7A, please review Amion.  03/27/2024, 4:23 PM   *This document has been created with the assistance of dictation software. Please excuse typographical errors. *

## 2024-03-27 NOTE — Care Management Obs Status (Signed)
 MEDICARE OBSERVATION STATUS NOTIFICATION   Patient Details  Name: Corey Petersen MRN: [id number] Date of Birth: [birth year]   Medicare Observation Status Notification Given:  Yes    Amerie Beaumont W, CMA 03/27/2024, 1:43 PM

## 2024-03-27 NOTE — Progress Notes (Signed)
 Initial Nutrition Assessment  DOCUMENTATION CODES:   Morbid obesity  INTERVENTION:   -Continue full liquid diet; RD will adjust supplement regimen with diet advancement as appropriate -Ensure Plus High Protein po BID, each supplement provides 350 kcal and 20 grams of protein  -MVI with minerals daily  NUTRITION DIAGNOSIS:   Increased nutrient needs related to acute illness as evidenced by estimated needs.  GOAL:   Patient will meet greater than or equal to 90% of their needs  MONITOR:   PO intake, Supplement acceptance, Diet advancement  REASON FOR ASSESSMENT:   Malnutrition Screening Tool    ASSESSMENT:   Pt with medical history significant for type diabetes mellitus and essential hypertension, who presented with acute onset of recurrent nausea and vomiting with associated chest discomfort as well as congestion and productive cough.  Pt admitted with intractable nausea and vomiting.   8/21- advanced to clear liquid diet 8/22- advanced to full liquid diet  Reviewed I/O's: -479 ml  x 24 hours and +1.5 L since admission  UOP: 1.6 L x 24 hours  Per MD notes, pt recently discharged on 01/29/24; pt had EGD which revealed severe esophagitis, however, pt ran out of PPI medications. Abdominal pelvic CT revealed cholelithiasis without cholecystitis.   Pt lying in bed with emesis bag. Pt sleeping soundly; he did not respond to touch. No family present to provide additional history.   No wt loss noted over the past year.   Medications reviewed and include lovenox , neurontin , reglan , protonix , vitamin D , and lactated ringers  infusion @ 50 ml/hr.   Lab Results  Component Value Date   HGBA1C 7.2 (H) 03/26/2024   PTA DM medications are 500 mg metformin  BID and 7 units insulin  glargine at bedtime.   Labs reviewed: CBGS: 120-241 (inpatient orders for glycemic control are 0-20 units insulin  aspart TID with meals, 0-5 units insulin  aspart daily at bedtime, and 20 units insulin   glargine daily).     NUTRITION - FOCUSED PHYSICAL EXAM:  Flowsheet Row Most Recent Value  Orbital Region No depletion  Upper Arm Region No depletion  Thoracic and Lumbar Region No depletion  Buccal Region No depletion  Temple Region No depletion  Clavicle Bone Region No depletion  Clavicle and Acromion Bone Region No depletion  Scapular Bone Region No depletion  Dorsal Hand No depletion  Patellar Region No depletion  Anterior Thigh Region No depletion  Posterior Calf Region No depletion  Edema (RD Assessment) Mild  Hair Reviewed  Eyes Reviewed  Mouth Reviewed  Skin Reviewed  Nails Reviewed    Diet Order:   Diet Order             Diet full liquid Room service appropriate? Yes; Fluid consistency: Thin  Diet effective now                   EDUCATION NEEDS:   No education needs have been identified at this time  Skin:  Skin Assessment: Reviewed RN Assessment  Last BM:  03/23/24  Height:   Ht Readings from Last 1 Encounters:  03/26/24 5' 6 (1.676 m)    Weight:   Wt Readings from Last 1 Encounters:  03/25/24 115.2 kg    Ideal Body Weight:  64.5 kg  BMI:  Body mass index is 41 kg/m.  Estimated Nutritional Needs:   Kcal:  1750-1950  Protein:  90-105 grams  Fluid:  1.7-1.9 L    Margery ORN, RD, LDN, CDCES Registered Dietitian III Certified Diabetes Care and Education  Specialist If unable to reach this RD, please use RD Inpatient group chat on secure chat between hours of 8am-4 pm daily

## 2024-03-27 NOTE — Progress Notes (Signed)
 Okay to give PO meds per Dr. Jerelene with sip of water.

## 2024-03-27 NOTE — Consult Note (Signed)
 Ruel Kung , MD [address], [address], [place], [place], [address] Phone: [phone] Fax: [phone]  Consultation  Referring Provider:     Dr Jerelene  Primary Care Physician:  Sharma Coyer, MD Primary Gastroenterologist:  Dr. [facility]          Reason for Consultation:     Nausea and vomiting   Date of Admission:  03/25/2024 Date of Consultation:  03/27/2024         HPI:   Corey Petersen is a 63 y.o. male is a patient seen by Dr Jinny back in 2024 for nausea, vomiting and abdominal pain . EGD at that time had shown features of esophagitis likely from reflux. There was also a concern for anxiety related issues . Gastroparesis was suspected but gastric emptying study was normal .    He presented this admission with nausea, vomiting , chest discomfort , congestion and cough . Ct scan of the abdomen and pelvis showed cholelithiasis but no acute findings. Similar findings on RUQ USG. HIDA scan was also normal.   Found to have AKI on admission presently resolved. , elevate glucose . Transaminases normal. Hba1c 7.2.  Had hypertensive emergency , elevated troponin. Seen by surgeons and felt not related to gall bladder.   In June 2025He underwent an upper endoscopy with Dr. Alena for hematemesis that showed grade D esophagitis with no bleeding, he was treated with a PPI and a repeat endoscopy on 02/24/2024 showed resolution after treatment.  He states that he has been on a PPI but he ran out of it and was awaiting refill.  He states he has been suffering with nausea and vomiting for over 2 years.  He states that he can throw up hours after he has had a meal.  Often the food is foul-smelling.  He has had diabetes for many years and suffers from neuropathy based on his history.  Past Medical History:  Diagnosis Date   Diabetes mellitus without complication (HCC)    DKA, type 2 (HCC) 07/09/2022   Hypertension     Past Surgical History:  Procedure Laterality Date    COLONOSCOPY N/A 03/09/2022   Procedure: COLONOSCOPY;  Surgeon: Jinny Carmine, MD;  Location: [facility] ENDOSCOPY;  Service: Endoscopy;  Laterality: N/A;   ESOPHAGOGASTRODUODENOSCOPY N/A 03/09/2022   Procedure: ESOPHAGOGASTRODUODENOSCOPY (EGD);  Surgeon: Jinny Carmine, MD;  Location: [facility] ENDOSCOPY;  Service: Endoscopy;  Laterality: N/A;   ESOPHAGOGASTRODUODENOSCOPY N/A 01/13/2024   Procedure: EGD (ESOPHAGOGASTRODUODENOSCOPY);  Surgeon: Jinny Carmine, MD;  Location: [facility] ENDOSCOPY;  Service: Endoscopy;  Laterality: N/A;   ESOPHAGOGASTRODUODENOSCOPY N/A 02/24/2024   Procedure: EGD (ESOPHAGOGASTRODUODENOSCOPY);  Surgeon: Jinny Carmine, MD;  Location: [facility] ENDOSCOPY;  Service: Endoscopy;  Laterality: N/A;   HERNIA REPAIR      Prior to Admission medications   Medication Sig Start Date End Date Taking? Authorizing Provider  amLODipine  (NORVASC ) 10 MG tablet TAKE 1 TABLET BY MOUTH EVERY DAY 01/30/24  Yes Simmons-Robinson, Makiera, MD  aspirin  EC 81 MG tablet Take 1 tablet (81 mg total) by mouth daily. Swallow whole. 09/05/22  Yes Drubel, Manuelita, PA-C  atorvastatin  (LIPITOR) 40 MG tablet TAKE 1 TABLET BY MOUTH EVERY DAY 01/23/24  Yes Simmons-Robinson, Coyer, MD  calcium  carbonate (TUMS - DOSED IN MG ELEMENTAL CALCIUM ) 500 MG chewable tablet Chew 1 tablet (200 mg of elemental calcium  total) by mouth 3 (three) times daily with meals. Patient taking differently: Chew 200 mg of elemental calcium  by mouth 3 (three) times daily with meals. As needed 07/10/22  Yes Caleen Qualia, MD  gabapentin  (NEURONTIN ) 300 MG capsule Take 1-2 capsules (300-600 mg total) by mouth 3 (three) times daily. Patient taking differently: Take 300-600 mg by mouth at bedtime. 02/20/23  Yes Penumalli, Vikram R, MD  hydrochlorothiazide  (HYDRODIURIL ) 25 MG tablet Take 12.5 mg by mouth daily. 01/27/24  Yes [provider]  insulin  glargine (LANTUS  SOLOSTAR) 100 UNIT/ML Solostar Pen INJECT 75 UNITS INTO THE SKIN AT BEDTIME. 10/24/23  Yes  Simmons-Robinson, Makiera, MD  losartan  (COZAAR ) 100 MG tablet Take 1 tablet (100 mg total) by mouth daily. 07/01/23  Yes Simmons-Robinson, Rockie, MD  metFORMIN  (GLUCOPHAGE -XR) 500 MG 24 hr tablet Take 500 mg by mouth 2 (two) times daily with a meal.   Yes [provider]  pantoprazole  (PROTONIX ) 40 MG tablet Take 2 tablets (80 mg total) by mouth 2 (two) times daily for 60 days, THEN 1 tablet (40 mg total) 2 (two) times daily. 01/13/24 05/12/24 Yes Von Bellis, MD  senna-docusate (SENOKOT-S) 8.6-50 MG tablet Take 1 tablet by mouth daily as needed for mild constipation. 01/01/24  Yes [provider]  tadalafil  (CIALIS ) 20 MG tablet TAKE 1 TABLET BY MOUTH EVERY DAY AS NEEDED FOR ERECTILE DYSFUNCTION 05/13/23  Yes Penne Knee, MD  Vitamin D , Ergocalciferol , (DRISDOL ) 1.25 MG (50000 UNIT) CAPS capsule Take 1 capsule (50,000 Units total) by mouth every 7 (seven) days. 01/14/24 04/13/24 Yes Von Bellis, MD  Blood Glucose Monitoring Suppl [facility] VERIO) w/Device KIT Use to check blood sugar before meals and fasting, as as needed 10/31/22   Cyndi Shaver, PA-C  glucose blood test strip Use as instructed 10/31/22   Drubel, Shaver, PA-C  Insulin  Pen Needle 29G X 12.7MM MISC 1 Needle by Does not apply route at bedtime. 09/19/22   Simmons-Robinson, Rockie, MD    Family History  Problem Relation Age of Onset   Diabetes Mother    Hypertension Mother    Seizures Mother    Hypertension Father    Colon cancer Father      Social History   Tobacco Use   Smoking status: Never   Smokeless tobacco: Never  Vaping Use   Vaping status: Never Used  Substance Use Topics   Alcohol use: Never   Drug use: Never    Allergies as of 03/25/2024 - Review Complete 03/25/2024  Allergen Reaction Noted   Ace inhibitors Cough 03/15/2016    Review of Systems:    All systems reviewed and negative except where noted in HPI.   Physical Exam:  Vital signs in last 24 hours: Temp:  [98.1 F (36.7  C)-98.6 F (37 C)] 98.1 F (36.7 C) (08/22 0833) Pulse Rate:  [68-90] 75 (08/22 0833) Resp:  [17-20] 17 (08/22 0833) BP: (138-177)/(77-92) 138/84 (08/22 0833) SpO2:  [96 %-100 %] 96 % (08/22 0833) Last BM Date : 03/23/24 General:   Pleasant, cooperative in NAD Head:  Normocephalic and atraumatic. Eyes:   No icterus.   Conjunctiva pink. PERRLA. Ears:  Normal auditory acuity. Neck:  Supple; no masses or thyroidomegaly Abdomen:  Soft, nondistended, nontender. Normal bowel sounds. No appreciable masses or hepatomegaly.  No rebound or guarding.  Neurologic:  Alert and oriented x3;  grossly normal neurologically. Skin:  Intact without significant lesions or rashes. Cervical Nodes:  No significant cervical adenopathy. Psych:  Alert and cooperative. Normal affect.  LAB RESULTS: Recent Labs    03/25/24 2033 03/26/24 0419 03/27/24 0653  WBC 14.0* 15.0* 7.5  HGB 15.9 14.8 13.5  HCT 45.9 43.5 39.3  PLT  364 289 245   BMET Recent Labs    03/25/24 2033 03/26/24 0419 03/27/24 0653  NA 135 138 139  K 3.6 3.7 3.6  CL 92* 100 102  CO2 24 29 29   GLUCOSE 329* 272* 119*  BUN 26* 23 13  CREATININE 1.33* 1.02 0.95  CALCIUM  8.8* 8.0* 7.8*   LFT Recent Labs    03/25/24 2033  PROT 7.2  ALBUMIN 3.3*  AST 25  ALT 16  ALKPHOS 96  BILITOT 2.8*   PT/INR Recent Labs    03/26/24 0720  LABPROT 15.8*  INR 1.2    STUDIES: NM Hepatobiliary Liver Func Result Date: 03/26/2024 CLINICAL DATA:  Abdominal pain. Nausea and vomiting. Cholelithiasis. EXAM: NUCLEAR MEDICINE HEPATOBILIARY IMAGING TECHNIQUE: Sequential images of the abdomen were obtained out to 60 minutes following intravenous administration of radiopharmaceutical. RADIOPHARMACEUTICALS:  7.7 mCi Tc-10m  Choletec  IV COMPARISON:  None Available. FINDINGS: Prompt uptake and biliary excretion of activity by the liver is seen. Gallbladder activity is visualized, consistent with patency of cystic duct. Biliary activity passes into small  bowel, consistent with patent common bile duct. IMPRESSION: Normal hepatobiliary scan, demonstrating patency of both the cystic and common bile ducts. Electronically Signed   By: Norleen DELENA Kil M.D.   On: 03/26/2024 16:04   ECHOCARDIOGRAM COMPLETE Result Date: 03/26/2024    ECHOCARDIOGRAM REPORT   Patient Name:   Corey Petersen Date of Exam: 03/26/2024 Medical Rec #:  [id number]      Height:       66.0 in Accession #:    [id number]     Weight:       254.0 lb Date of Birth:  01-19-1961      BSA:          2.214 m Patient Age:    63 years       BP:           157/77 mmHg Patient Gender: M              HR:           84 bpm. Exam Location:  [facility] Procedure: 2D Echo, Color Doppler, Limited Color Doppler, Strain Analysis and            Intracardiac Opacification Agent (Both Spectral and Color Flow            Doppler were utilized during procedure). Indications:     NSTEMI I21.4  History:         Patient has prior history of Echocardiogram examinations, most                  recent 11/15/2022. Cardiomyopathy.  Sonographer:     Ashley McNeely-Sloane Referring Phys:  [id number] MADISON A MANSY Diagnosing Phys: Evalene Lunger MD  Sonographer Comments: Global longitudinal strain was attempted. IMPRESSIONS  1. Left ventricular ejection fraction, by estimation, is 60 to 65%. The left ventricle has normal function. The left ventricle has no regional wall motion abnormalities. There is severe left ventricular hypertrophy, no significant LVOT gradient. Near cavity obliteration in the mid to distal ventricle and apical region. Left ventricular diastolic parameters are consistent with Grade I diastolic dysfunction (impaired relaxation).  2. Right ventricular systolic function is normal. The right ventricular size is normal. Tricuspid regurgitation signal is inadequate for assessing PA pressure.  3. Left atrial size was mildly dilated.  4. The mitral valve is normal in structure. No evidence of mitral valve regurgitation. No evidence of mitral  stenosis.  5. The aortic  valve has an indeterminant number of cusps. Aortic valve regurgitation is not visualized. No aortic stenosis is present.  6. The inferior vena cava is normal in size with greater than 50% respiratory variability, suggesting right atrial pressure of 3 mmHg. FINDINGS  Left Ventricle: Left ventricular ejection fraction, by estimation, is 60 to 65%. The left ventricle has normal function. The left ventricle has no regional wall motion abnormalities. Definity  contrast agent was given IV to delineate the left ventricular  endocardial borders. Strain was performed and the global longitudinal strain is indeterminate. The left ventricular internal cavity size was normal in size. There is severe left ventricular hypertrophy. Left ventricular diastolic parameters are consistent with Grade I diastolic dysfunction (impaired relaxation). Right Ventricle: The right ventricular size is normal. No increase in right ventricular wall thickness. Right ventricular systolic function is normal. Tricuspid regurgitation signal is inadequate for assessing PA pressure. Left Atrium: Left atrial size was mildly dilated. Right Atrium: Right atrial size was normal in size. Pericardium: There is no evidence of pericardial effusion. Mitral Valve: The mitral valve is normal in structure. No evidence of mitral valve regurgitation. No evidence of mitral valve stenosis. MV peak gradient, 3.2 mmHg. The mean mitral valve gradient is 1.0 mmHg. Tricuspid Valve: The tricuspid valve is normal in structure. Tricuspid valve regurgitation is not demonstrated. No evidence of tricuspid stenosis. Aortic Valve: The aortic valve has an indeterminant number of cusps. Aortic valve regurgitation is not visualized. No aortic stenosis is present. Aortic valve mean gradient measures 3.0 mmHg. Aortic valve peak gradient measures 6.2 mmHg. Aortic valve area, by VTI measures 2.10 cm. Pulmonic Valve: The pulmonic valve was normal in structure.  Pulmonic valve regurgitation is not visualized. No evidence of pulmonic stenosis. Aorta: The aortic root is normal in size and structure. Venous: The inferior vena cava is normal in size with greater than 50% respiratory variability, suggesting right atrial pressure of 3 mmHg. IAS/Shunts: No atrial level shunt detected by color flow Doppler. Additional Comments: 3D was performed not requiring image post processing on an independent workstation and was indeterminate.  LEFT VENTRICLE PLAX 2D LVIDd:         4.90 cm     Diastology LVIDs:         2.90 cm     LV e' medial:    4.19 cm/s LV PW:         1.60 cm     LV E/e' medial:  13.0 LV IVS:        1.80 cm     LV e' lateral:   6.15 cm/s LVOT diam:     1.70 cm     LV E/e' lateral: 8.9 LV SV:         43 LV SV Index:   20 LVOT Area:     2.27 cm  LV Volumes (MOD) LV vol d, MOD A2C: 81.9 ml LV vol d, MOD A4C: 75.1 ml LV vol s, MOD A2C: 18.3 ml LV vol s, MOD A4C: 18.9 ml LV SV MOD A2C:     63.6 ml LV SV MOD A4C:     75.1 ml LV SV MOD BP:      57.5 ml RIGHT VENTRICLE RV Basal diam:  4.30 cm RV Mid diam:    2.80 cm RV S prime:     15.40 cm/s TAPSE (M-mode): 3.2 cm LEFT ATRIUM             Index        RIGHT ATRIUM  Index LA diam:        4.20 cm 1.90 cm/m   RA Area:     16.40 cm LA Vol (A2C):   75.1 ml 33.93 ml/m  RA Volume:   41.10 ml  18.57 ml/m LA Vol (A4C):   50.2 ml 22.68 ml/m LA Biplane Vol: 63.6 ml 28.73 ml/m  AORTIC VALVE                    PULMONIC VALVE AV Area (Vmax):    2.29 cm     PV Vmax:        1.22 m/s AV Area (Vmean):   2.14 cm     PV Vmean:       81.100 cm/s AV Area (VTI):     2.10 cm     PV VTI:         0.238 m AV Vmax:           124.00 cm/s  PV Peak grad:   6.0 mmHg AV Vmean:          82.600 cm/s  PV Mean grad:   3.0 mmHg AV VTI:            0.206 m      RVOT Peak grad: 4 mmHg AV Peak Grad:      6.2 mmHg AV Mean Grad:      3.0 mmHg LVOT Vmax:         125.00 cm/s LVOT Vmean:        78.000 cm/s LVOT VTI:          0.191 m LVOT/AV VTI ratio: 0.93   AORTA Ao Root diam: 2.70 cm Ao Asc diam:  3.50 cm MITRAL VALVE MV Area (PHT): 4.31 cm    SHUNTS MV Area VTI:   2.34 cm    Systemic VTI:  0.19 m MV Peak grad:  3.2 mmHg    Systemic Diam: 1.70 cm MV Mean grad:  1.0 mmHg    Pulmonic VTI:  0.209 m MV Vmax:       0.89 m/s MV Vmean:      56.6 cm/s MV Decel Time: 176 msec MV E velocity: 54.50 cm/s MV A velocity: 83.30 cm/s MV E/A ratio:  0.65 Evalene Lunger MD Electronically signed by Evalene Lunger MD Signature Date/Time: 03/26/2024/3:59:03 PM    Final    CT ABDOMEN PELVIS W CONTRAST Result Date: 03/26/2024 CLINICAL DATA:  Abdominal pain, nausea, vomiting EXAM: CT ABDOMEN AND PELVIS WITH CONTRAST TECHNIQUE: Multidetector CT imaging of the abdomen and pelvis was performed using the standard protocol following bolus administration of intravenous contrast. RADIATION DOSE REDUCTION: This exam was performed according to the departmental dose-optimization program which includes automated exposure control, adjustment of the mA and/or kV according to patient size and/or use of iterative reconstruction technique. CONTRAST:  OMNIPAQUE  IOHEXOL  350 MG/ML SOLN COMPARISON:  02/10/2024 FINDINGS: Lower chest: No acute abnormality Hepatobiliary: Layering gallstones within the gallbladder, similar to prior study. No CT evidence of acute cholecystitis. No focal hepatic abnormality or biliary ductal dilatation. Pancreas: No focal abnormality or ductal dilatation. Spleen: No focal abnormality.  Normal size. Adrenals/Urinary Tract: 8.5 cm cyst in the left kidney is unchanged and appears benign. No follow-up imaging recommended. No stones or hydronephrosis. Adrenal glands and urinary bladder unremarkable. Stomach/Bowel: Stomach, large and small bowel grossly unremarkable. Vascular/Lymphatic: No evidence of aneurysm or adenopathy. Aortic atherosclerosis. Reproductive: No visible focal abnormality. Other: No free fluid or free air. Musculoskeletal: No acute bony abnormality.  IMPRESSION:  No acute findings in the abdomen or pelvis. Cholelithiasis.  No CT evidence of acute cholecystitis. Aortic atherosclerosis. Electronically Signed   By: Franky Crease M.D.   On: 03/26/2024 00:33   DG Chest Portable 1 View Result Date: 03/26/2024 CLINICAL DATA:  Chest pain, shortness of breath, cough EXAM: PORTABLE CHEST 1 VIEW COMPARISON:  01/11/2024 FINDINGS: Cardiomegaly. No confluent airspace opacities or effusions. No edema. No acute bony abnormality. IMPRESSION: Cardiomegaly.  No active disease. Electronically Signed   By: Franky Crease M.D.   On: 03/26/2024 00:31      Impression / Plan:   Corey Petersen is a 63 y.o. y/o male with a prior history of reflux esophagitis admitted with nausea, vomiting, AKI, hypertensive urgency . I have been called to evaluate for nausea and vomiting.  Based on his history it is very likely he suffers from a degree of gastroparesis causing severe reflux esophagitis.  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  It is possible recent symptoms were worse because he was awaiting refill on his PPI which is available at his pharmacy presently he states.  After coming into the hospital his symptoms are better but he is belching a lot.  Plan 1.  Suggest long-term twice daily dosing of PPI without interruption as an outpatient. 2.  Gastroparesis diet which I will described and explained to him in detail with low fiber low-fat, small meals more often.  As needed use of Reglan . 3.  Follow-up with [facility] as an outpatient, if there is concern for persistence of reflux-like symptoms consider treatment with Voquenza. 4.  Good control of his diabetes 5.  I explained to him at any point of time he feels nauseous to back off on food intake and go on a liquid diet. 6.  There is no indication for repeat endoscopy at this point of time since we know he has a history of esophagitis and the treatment would not change if  he did have esophagitis at this point of time as well. 7.  I would avoid any narcotics/tramadol  as it causes further delay in gastric emptying exacerbating the symptoms.  I will sign off.  Please call me if any further GI concerns or questions.  We would like to thank you for the opportunity to participate in the care of [facility].    Thank you for involving me in the care of this patient.      LOS: 0 days   Ruel Kung, MD  03/27/2024, 1:08 PM

## 2024-03-27 NOTE — Plan of Care (Signed)

## 2024-03-28 DIAGNOSIS — K3184 Gastroparesis: Secondary | ICD-10-CM

## 2024-03-28 LAB — BASIC METABOLIC PANEL WITH GFR
Anion gap: 4 — ABNORMAL LOW (ref 5–15)
BUN: 18 mg/dL (ref 8–23)
CO2: 28 mmol/L (ref 22–32)
Calcium: 8 mg/dL — ABNORMAL LOW (ref 8.9–10.3)
Chloride: 104 mmol/L (ref 98–111)
Creatinine, Ser: 1.12 mg/dL (ref 0.61–1.24)
GFR, Estimated: >60 mL/min (ref >60)
Glucose, Bld: 115 mg/dL — ABNORMAL HIGH (ref 70–99)
Potassium: 3.4 mmol/L — ABNORMAL LOW (ref 3.5–5.1)
Sodium: 136 mmol/L (ref 135–145)

## 2024-03-28 LAB — CBC
HCT: 38.9 % — ABNORMAL LOW (ref 39.0–52.0)
Hemoglobin: 13.4 g/dL (ref 13.0–17.0)
MCH: 32.1 pg (ref 26.0–34.0)
MCHC: 34.4 g/dL (ref 30.0–36.0)
MCV: 93.1 fL (ref 80.0–100.0)
Platelets: 268 K/uL (ref 150–400)
RBC: 4.18 MIL/uL — ABNORMAL LOW (ref 4.22–5.81)
RDW: 11.9 % (ref 11.5–15.5)
WBC: 8.5 K/uL (ref 4.0–10.5)
nRBC: 0 % (ref 0.0–0.2)

## 2024-03-28 LAB — GLUCOSE, CAPILLARY
Glucose-Capillary: 137 mg/dL — ABNORMAL HIGH (ref 70–99)
Glucose-Capillary: 141 mg/dL — ABNORMAL HIGH (ref 70–99)

## 2024-03-28 LAB — MAGNESIUM: Magnesium: 2 mg/dL (ref 1.7–2.4)

## 2024-03-28 MED ORDER — ONDANSETRON HCL 4 MG PO TABS: 4.0000 mg | ORAL_TABLET | Freq: Four times a day (QID) | ORAL | 0 refills | Status: AC | PRN

## 2024-03-28 MED ORDER — LANTUS SOLOSTAR 100 UNIT/ML ~~LOC~~ SOPN: 36.0000 [IU] | PEN_INJECTOR | Freq: Every day | SUBCUTANEOUS | Status: AC

## 2024-03-28 MED ORDER — POTASSIUM CHLORIDE 20 MEQ PO PACK
40.0000 meq | PACK | Freq: Once | ORAL | Status: AC
  Administered 2024-03-28: 40 meq via ORAL
  Filled 2024-03-28: qty 2

## 2024-03-28 MED ORDER — PANTOPRAZOLE SODIUM 40 MG PO TBEC: 40.0000 mg | DELAYED_RELEASE_TABLET | Freq: Two times a day (BID) | ORAL | 3 refills | Status: AC

## 2024-03-28 MED ORDER — METOCLOPRAMIDE HCL 10 MG PO TABS: 10.0000 mg | ORAL_TABLET | Freq: Three times a day (TID) | ORAL | 0 refills | Status: AC | PRN

## 2024-03-28 MED ORDER — CARVEDILOL 12.5 MG PO TABS: 12.5000 mg | ORAL_TABLET | Freq: Two times a day (BID) | ORAL | 3 refills | Status: AC

## 2024-03-28 NOTE — Discharge Summary (Addendum)
 Physician Discharge Summary   Patient: Corey Petersen MRN: [id number] DOB: July 13, 1961  Admit date:     03/25/2024  Discharge date: 03/28/24  Discharge Physician: Laree Lock   PCP: Sharma Coyer, MD   Recommendations at discharge:   Follow up with PCP in 1 week - Repeat BMP, Mag, CBC Monitor BP - added Coreg , can Increase Lantus  as po intake improves  Follow up with Gastroenterology outpatient Follow up with Cardiology outpatient  Discharge Diagnoses: Active Problems:   Nausea and vomiting   Chest pain   Type 2 diabetes mellitus with peripheral neuropathy (HCC)   Dyslipidemia   Hypertension   Essential hypertension   GERD (gastroesophageal reflux disease)   Calculus of gallbladder without cholecystitis without obstruction   Generalized abdominal cramping   Nonsustained ventricular tachycardia (HCC)   Elevated troponin   LVH (left ventricular hypertrophy)  Resolved Problems:   * No resolved hospital problems. [facility] Course: Corey Petersen is a 63 y.o. African-American male with medical history diabetes mellitus and essential hypertension presented due to acute onset recurrent nausea and vomiting with associated chest discomfort as well as congestion and productive cough. Seen by Cardiology, elevated troponin likely demand ischemia. Seen by Surgery, HIDA negative - no evidence of cholecystitis. Suspect symptoms due to esophagitis, on IV PPI. Seen by GI, no indication for EGD. Symptoms improved with PPI and Reglan  Hospital course as below  Intractable nausea and vomiting - resolved Suspect 2/2 gastroparesis  Hx Esophagitis and H.pylori - was recently discharged 06/25, EGD with severe esophagitis, Reports ran out of medication PPI 2 weeks prior to adm - Abdominal pelvic CT scan revealed no acute abnormalities.  It shows cholelithiasis without cholecystitis - was on IV PPI bid, Reglan  prn - tolerating regular diet, improving - Seen by GI, appreciate recs.  Long term PPI bid, no indication for repeat endoscopy - antiemetics prn, avoid opioids   Elevated Troponin likely 2/2 demand ischemia - Troponin flat, was on IV heparin , discontinued - Echo with no focal wall motion abnormalities, G1DD - on asa 81mg  - Follow up with Cardiology outpatient, appreciate recs inpatient   Cholelithiasis - Seen by Surgery, appreciate recs. HIDA scan negative   Type 2 diabetes mellitus with peripheral neuropathy (HCC) - HbA1c 7.2. Home dose 36units Lantus  bid, Metformin  - Discharge on Lantus  36 units daily, Metformin , follow up outpatient - can uptitrate Lantus  as po intake increases - Continue Neurontin    Dyslipidemia - Continue statin therapy   Hypertensive urgency - resolved Essential hypertension - On Amlodipine  10mg , hydrochlorothiazide  12.5mg , Losartan  100mg  - Add Coreg  12.5mg  bid   GERD (gastroesophageal reflux disease) - PPI bid   Obesity -- BMI 41 -- Lifestyle modification  Hypokalemia - repleted Mag normal    Consultants: Cardiology General Surgery Gastroenterology  Procedures performed: None  Disposition: Home Diet recommendation:  Discharge Diet Orders (From admission, onward)     Start     Ordered   03/28/24 0000  Diet - low sodium heart healthy        03/28/24 1403            DISCHARGE MEDICATION: Allergies as of 03/28/2024       Reactions   Ace Inhibitors Cough        Medication List     TAKE these medications    amLODipine  10 MG tablet Commonly known as: NORVASC  TAKE 1 TABLET BY MOUTH EVERY DAY   aspirin  EC 81 MG tablet Take 1 tablet (81 mg total) by mouth daily.  Swallow whole.   atorvastatin  40 MG tablet Commonly known as: LIPITOR TAKE 1 TABLET BY MOUTH EVERY DAY   calcium  carbonate 500 MG chewable tablet Commonly known as: TUMS - dosed in mg elemental calcium  Chew 1 tablet (200 mg of elemental calcium  total) by mouth 3 (three) times daily with meals. What changed: additional instructions    carvedilol  12.5 MG tablet Commonly known as: COREG  Take 1 tablet (12.5 mg total) by mouth 2 (two) times daily with a meal.   gabapentin  300 MG capsule Commonly known as: NEURONTIN  Take 1-2 capsules (300-600 mg total) by mouth 3 (three) times daily. What changed: when to take this   glucose blood test strip Use as instructed   hydrochlorothiazide  25 MG tablet Commonly known as: HYDRODIURIL  Take 12.5 mg by mouth daily.   Insulin  Pen Needle 29G X 12.7MM Misc 1 Needle by Does not apply route at bedtime.   Lantus  SoloStar 100 UNIT/ML Solostar Pen Generic drug: insulin  glargine Inject 36 Units into the skin at bedtime. Start taking on: March 29, 2024 What changed: how much to take   losartan  100 MG tablet Commonly known as: COZAAR  Take 1 tablet (100 mg total) by mouth daily.   metFORMIN  500 MG 24 hr tablet Commonly known as: GLUCOPHAGE -XR Take 500 mg by mouth 2 (two) times daily with a meal.   metoCLOPramide  10 MG tablet Commonly known as: REGLAN  Take 1 tablet (10 mg total) by mouth every 8 (eight) hours as needed for nausea or vomiting.   ondansetron  4 MG tablet Commonly known as: ZOFRAN  Take 1 tablet (4 mg total) by mouth every 6 (six) hours as needed for nausea.   OneTouch Verio w/Device Kit Use to check blood sugar before meals and fasting, as as needed   pantoprazole  40 MG tablet Commonly known as: PROTONIX  Take 1 tablet (40 mg total) by mouth 2 (two) times daily. What changed: See the new instructions.   senna-docusate 8.6-50 MG tablet Commonly known as: Senokot-S Take 1 tablet by mouth daily as needed for mild constipation.   tadalafil  20 MG tablet Commonly known as: CIALIS  TAKE 1 TABLET BY MOUTH EVERY DAY AS NEEDED FOR ERECTILE DYSFUNCTION   Vitamin D  (Ergocalciferol ) 1.25 MG (50000 UNIT) Caps capsule Commonly known as: DRISDOL  Take 1 capsule (50,000 Units total) by mouth every 7 (seven) days.        Follow-up Information     Simmons-Robinson,  Rockie, MD Follow up.   Specialty: Family Medicine Why: hospital follow up Contact information: [address] [place] [address] [phone]                Discharge Exam: Corey Petersen   03/25/24 2022  Weight: 115.2 kg   GENERAL:  63 y.o.-year-old African American male patient lying in the bed with no acute distress.  NECK:  Supple, no jugular venous distention. No thyroid  enlargement, no tenderness.  LUNGS: Normal breath sounds bilaterally, no wheezing, rales,rhonchi or crepitation. No use of accessory muscles of respiration.  CARDIOVASCULAR: Regular rate and rhythm, S1, S2 normal. No murmurs, rubs, or gallops.  ABDOMEN: Soft, nondistended, nontender. Bowel sounds present. No organomegaly or mass.  EXTREMITIES: No pedal edema, cyanosis, or clubbing.  NEUROLOGIC: Cranial nerves II through XII are intact. Muscle strength 5/5 in all extremities. Sensation intact. Gait not checked.  PSYCHIATRIC: The patient is alert and oriented x 3.  Normal affect and good eye contact. SKIN: No obvious rash, lesion, or ulcer  Condition at discharge: good  The  results of significant diagnostics from this hospitalization (including imaging, microbiology, ancillary and laboratory) are listed below for reference.   Imaging Studies: NM Hepatobiliary Liver Func Result Date: 03/26/2024 CLINICAL DATA:  Abdominal pain. Nausea and vomiting. Cholelithiasis. EXAM: NUCLEAR MEDICINE HEPATOBILIARY IMAGING TECHNIQUE: Sequential images of the abdomen were obtained out to 60 minutes following intravenous administration of radiopharmaceutical. RADIOPHARMACEUTICALS:  7.7 mCi Tc-39m  Choletec  IV COMPARISON:  None Available. FINDINGS: Prompt uptake and biliary excretion of activity by the liver is seen. Gallbladder activity is visualized, consistent with patency of cystic duct. Biliary activity passes into small bowel, consistent with patent common bile duct. IMPRESSION: Normal hepatobiliary  scan, demonstrating patency of both the cystic and common bile ducts. Electronically Signed   By: Norleen DELENA Kil M.D.   On: 03/26/2024 16:04   ECHOCARDIOGRAM COMPLETE Result Date: 03/26/2024    ECHOCARDIOGRAM REPORT   Patient Name:   Corey Petersen Date of Exam: 03/26/2024 Medical Rec #:  [id number]      Height:       66.0 in Accession #:    [id number]     Weight:       254.0 lb Date of Birth:  11/23/1960      BSA:          2.214 m Patient Age:    63 years       BP:           157/77 mmHg Patient Gender: M              HR:           84 bpm. Exam Location:  [facility] Procedure: 2D Echo, Color Doppler, Limited Color Doppler, Strain Analysis and            Intracardiac Opacification Agent (Both Spectral and Color Flow            Doppler were utilized during procedure). Indications:     NSTEMI I21.4  History:         Patient has prior history of Echocardiogram examinations, most                  recent 11/15/2022. Cardiomyopathy.  Sonographer:     Ashley McNeely-Sloane Referring Phys:  [id number] MADISON A MANSY Diagnosing Phys: Evalene Lunger MD  Sonographer Comments: Global longitudinal strain was attempted. IMPRESSIONS  1. Left ventricular ejection fraction, by estimation, is 60 to 65%. The left ventricle has normal function. The left ventricle has no regional wall motion abnormalities. There is severe left ventricular hypertrophy, no significant LVOT gradient. Near cavity obliteration in the mid to distal ventricle and apical region. Left ventricular diastolic parameters are consistent with Grade I diastolic dysfunction (impaired relaxation).  2. Right ventricular systolic function is normal. The right ventricular size is normal. Tricuspid regurgitation signal is inadequate for assessing PA pressure.  3. Left atrial size was mildly dilated.  4. The mitral valve is normal in structure. No evidence of mitral valve regurgitation. No evidence of mitral stenosis.  5. The aortic valve has an indeterminant number of cusps. Aortic valve  regurgitation is not visualized. No aortic stenosis is present.  6. The inferior vena cava is normal in size with greater than 50% respiratory variability, suggesting right atrial pressure of 3 mmHg. FINDINGS  Left Ventricle: Left ventricular ejection fraction, by estimation, is 60 to 65%. The left ventricle has normal function. The left ventricle has no regional wall motion abnormalities. Definity  contrast agent was given IV to delineate the left ventricular  endocardial  borders. Strain was performed and the global longitudinal strain is indeterminate. The left ventricular internal cavity size was normal in size. There is severe left ventricular hypertrophy. Left ventricular diastolic parameters are consistent with Grade I diastolic dysfunction (impaired relaxation). Right Ventricle: The right ventricular size is normal. No increase in right ventricular wall thickness. Right ventricular systolic function is normal. Tricuspid regurgitation signal is inadequate for assessing PA pressure. Left Atrium: Left atrial size was mildly dilated. Right Atrium: Right atrial size was normal in size. Pericardium: There is no evidence of pericardial effusion. Mitral Valve: The mitral valve is normal in structure. No evidence of mitral valve regurgitation. No evidence of mitral valve stenosis. MV peak gradient, 3.2 mmHg. The mean mitral valve gradient is 1.0 mmHg. Tricuspid Valve: The tricuspid valve is normal in structure. Tricuspid valve regurgitation is not demonstrated. No evidence of tricuspid stenosis. Aortic Valve: The aortic valve has an indeterminant number of cusps. Aortic valve regurgitation is not visualized. No aortic stenosis is present. Aortic valve mean gradient measures 3.0 mmHg. Aortic valve peak gradient measures 6.2 mmHg. Aortic valve area, by VTI measures 2.10 cm. Pulmonic Valve: The pulmonic valve was normal in structure. Pulmonic valve regurgitation is not visualized. No evidence of pulmonic stenosis. Aorta:  The aortic root is normal in size and structure. Venous: The inferior vena cava is normal in size with greater than 50% respiratory variability, suggesting right atrial pressure of 3 mmHg. IAS/Shunts: No atrial level shunt detected by color flow Doppler. Additional Comments: 3D was performed not requiring image post processing on an independent workstation and was indeterminate.  LEFT VENTRICLE PLAX 2D LVIDd:         4.90 cm     Diastology LVIDs:         2.90 cm     LV e' medial:    4.19 cm/s LV PW:         1.60 cm     LV E/e' medial:  13.0 LV IVS:        1.80 cm     LV e' lateral:   6.15 cm/s LVOT diam:     1.70 cm     LV E/e' lateral: 8.9 LV SV:         43 LV SV Index:   20 LVOT Area:     2.27 cm  LV Volumes (MOD) LV vol d, MOD A2C: 81.9 ml LV vol d, MOD A4C: 75.1 ml LV vol s, MOD A2C: 18.3 ml LV vol s, MOD A4C: 18.9 ml LV SV MOD A2C:     63.6 ml LV SV MOD A4C:     75.1 ml LV SV MOD BP:      57.5 ml RIGHT VENTRICLE RV Basal diam:  4.30 cm RV Mid diam:    2.80 cm RV S prime:     15.40 cm/s TAPSE (M-mode): 3.2 cm LEFT ATRIUM             Index        RIGHT ATRIUM           Index LA diam:        4.20 cm 1.90 cm/m   RA Area:     16.40 cm LA Vol (A2C):   75.1 ml 33.93 ml/m  RA Volume:   41.10 ml  18.57 ml/m LA Vol (A4C):   50.2 ml 22.68 ml/m LA Biplane Vol: 63.6 ml 28.73 ml/m  AORTIC VALVE  PULMONIC VALVE AV Area (Vmax):    2.29 cm     PV Vmax:        1.22 m/s AV Area (Vmean):   2.14 cm     PV Vmean:       81.100 cm/s AV Area (VTI):     2.10 cm     PV VTI:         0.238 m AV Vmax:           124.00 cm/s  PV Peak grad:   6.0 mmHg AV Vmean:          82.600 cm/s  PV Mean grad:   3.0 mmHg AV VTI:            0.206 m      RVOT Peak grad: 4 mmHg AV Peak Grad:      6.2 mmHg AV Mean Grad:      3.0 mmHg LVOT Vmax:         125.00 cm/s LVOT Vmean:        78.000 cm/s LVOT VTI:          0.191 m LVOT/AV VTI ratio: 0.93  AORTA Ao Root diam: 2.70 cm Ao Asc diam:  3.50 cm MITRAL VALVE MV Area (PHT): 4.31 cm     SHUNTS MV Area VTI:   2.34 cm    Systemic VTI:  0.19 m MV Peak grad:  3.2 mmHg    Systemic Diam: 1.70 cm MV Mean grad:  1.0 mmHg    Pulmonic VTI:  0.209 m MV Vmax:       0.89 m/s MV Vmean:      56.6 cm/s MV Decel Time: 176 msec MV E velocity: 54.50 cm/s MV A velocity: 83.30 cm/s MV E/A ratio:  0.65 Evalene Lunger MD Electronically signed by Evalene Lunger MD Signature Date/Time: 03/26/2024/3:59:03 PM    Final    CT ABDOMEN PELVIS W CONTRAST Result Date: 03/26/2024 CLINICAL DATA:  Abdominal pain, nausea, vomiting EXAM: CT ABDOMEN AND PELVIS WITH CONTRAST TECHNIQUE: Multidetector CT imaging of the abdomen and pelvis was performed using the standard protocol following bolus administration of intravenous contrast. RADIATION DOSE REDUCTION: This exam was performed according to the departmental dose-optimization program which includes automated exposure control, adjustment of the mA and/or kV according to patient size and/or use of iterative reconstruction technique. CONTRAST:  OMNIPAQUE  IOHEXOL  350 MG/ML SOLN COMPARISON:  02/10/2024 FINDINGS: Lower chest: No acute abnormality Hepatobiliary: Layering gallstones within the gallbladder, similar to prior study. No CT evidence of acute cholecystitis. No focal hepatic abnormality or biliary ductal dilatation. Pancreas: No focal abnormality or ductal dilatation. Spleen: No focal abnormality.  Normal size. Adrenals/Urinary Tract: 8.5 cm cyst in the left kidney is unchanged and appears benign. No follow-up imaging recommended. No stones or hydronephrosis. Adrenal glands and urinary bladder unremarkable. Stomach/Bowel: Stomach, large and small bowel grossly unremarkable. Vascular/Lymphatic: No evidence of aneurysm or adenopathy. Aortic atherosclerosis. Reproductive: No visible focal abnormality. Other: No free fluid or free air. Musculoskeletal: No acute bony abnormality. IMPRESSION: No acute findings in the abdomen or pelvis. Cholelithiasis.  No CT evidence of acute  cholecystitis. Aortic atherosclerosis. Electronically Signed   By: Franky Crease M.D.   On: 03/26/2024 00:33   DG Chest Portable 1 View Result Date: 03/26/2024 CLINICAL DATA:  Chest pain, shortness of breath, cough EXAM: PORTABLE CHEST 1 VIEW COMPARISON:  01/11/2024 FINDINGS: Cardiomegaly. No confluent airspace opacities or effusions. No edema. No acute bony abnormality. IMPRESSION: Cardiomegaly.  No active disease.  Electronically Signed   By: Franky Crease M.D.   On: 03/26/2024 00:31    Microbiology: Results for orders placed or performed during the hospital encounter of 03/25/24  Resp panel by RT-PCR (RSV, Flu A&B, Covid) Anterior Nasal Swab     Status: None   Collection Time: 03/26/24 12:10 AM   Specimen: Anterior Nasal Swab  Result Value Ref Range Status   SARS Coronavirus 2 by RT PCR NEGATIVE NEGATIVE Final    Comment: (NOTE) SARS-CoV-2 target nucleic acids are NOT DETECTED.  The SARS-CoV-2 RNA is generally detectable in upper respiratory specimens during the acute phase of infection. The lowest concentration of SARS-CoV-2 viral copies this assay can detect is 138 copies/mL. A negative result does not preclude SARS-Cov-2 infection and should not be used as the sole basis for treatment or other patient management decisions. A negative result may occur with  improper specimen collection/handling, submission of specimen other than nasopharyngeal swab, presence of viral mutation(s) within the areas targeted by this assay, and inadequate number of viral copies(<138 copies/mL). A negative result must be combined with clinical observations, patient history, and epidemiological information. The expected result is Negative.  Fact Sheet for Patients:  BloggerCourse.com  Fact Sheet for Healthcare Providers:  SeriousBroker.it  This test is no t yet approved or cleared by the [place]  FDA and  has been authorized for detection and/or  diagnosis of SARS-CoV-2 by FDA under an Emergency Use Authorization (EUA). This EUA will remain  in effect (meaning this test can be used) for the duration of the COVID-19 declaration under Section 564(b)(1) of the Act, 21 U.S.C.section 360bbb-3(b)(1), unless the authorization is terminated  or revoked sooner.       Influenza A by PCR NEGATIVE NEGATIVE Final   Influenza B by PCR NEGATIVE NEGATIVE Final    Comment: (NOTE) The Xpert Xpress SARS-CoV-2/FLU/RSV plus assay is intended as an aid in the diagnosis of influenza from Nasopharyngeal swab specimens and should not be used as a sole basis for treatment. Nasal washings and aspirates are unacceptable for Xpert Xpress SARS-CoV-2/FLU/RSV testing.  Fact Sheet for Patients: BloggerCourse.com  Fact Sheet for Healthcare Providers: SeriousBroker.it  This test is not yet approved or cleared by the [place]  FDA and has been authorized for detection and/or diagnosis of SARS-CoV-2 by FDA under an Emergency Use Authorization (EUA). This EUA will remain in effect (meaning this test can be used) for the duration of the COVID-19 declaration under Section 564(b)(1) of the Act, 21 U.S.C. section 360bbb-3(b)(1), unless the authorization is terminated or revoked.     Resp Syncytial Virus by PCR NEGATIVE NEGATIVE Final    Comment: (NOTE) Fact Sheet for Patients: BloggerCourse.com  Fact Sheet for Healthcare Providers: SeriousBroker.it  This test is not yet approved or cleared by the [place]  FDA and has been authorized for detection and/or diagnosis of SARS-CoV-2 by FDA under an Emergency Use Authorization (EUA). This EUA will remain in effect (meaning this test can be used) for the duration of the COVID-19 declaration under Section 564(b)(1) of the Act, 21 U.S.C. section 360bbb-3(b)(1), unless the authorization is terminated  or revoked.  Performed at [facility], [address] [place], [place] [address]     Labs: CBC: Recent Labs  Lab 03/25/24 2033 03/26/24 0419 03/27/24 0653 03/28/24 0528  WBC 14.0* 15.0* 7.5 8.5  HGB 15.9 14.8 13.5 13.4  HCT 45.9 43.5 39.3 38.9*  MCV 92.4 94.0 94.2 93.1  PLT 364 289 245 268   Basic Metabolic  Panel: Recent Labs  Lab 03/25/24 2033 03/26/24 0419 03/27/24 0653 03/28/24 0528  NA 135 138 139 136  K 3.6 3.7 3.6 3.4*  CL 92* 100 102 104  CO2 24 29 29 28   GLUCOSE 329* 272* 119* 115*  BUN 26* 23 13 18   CREATININE 1.33* 1.02 0.95 1.12  CALCIUM  8.8* 8.0* 7.8* 8.0*  MG 2.0  --   --  2.0   Liver Function Tests: Recent Labs  Lab 03/25/24 2033  AST 25  ALT 16  ALKPHOS 96  BILITOT 2.8*  PROT 7.2  ALBUMIN 3.3*   CBG: Recent Labs  Lab 03/27/24 1231 03/27/24 1714 03/27/24 2012 03/28/24 0815 03/28/24 1208  GLUCAP 173* 91 170* 137* 141*    Discharge time spent: greater than 30 minutes.  Signed: Laree Lock, MD [facility] 03/28/2024

## 2024-03-28 NOTE — Plan of Care (Signed)
 Patient has remained free from any noted signs of acute GI distress.  No noted signs of cute GI distress exhibited during current shift.  Patient has not required any additional medical intervention at this time.  To continue to be monitored by hospital staff until discharged.

## 2024-03-28 NOTE — Progress Notes (Signed)
 Patient is alert and oriented X 4. Discharge instructions given. No any questions or concern at this time.

## 2024-03-28 NOTE — Plan of Care (Signed)
  Problem: Education: Goal: Ability to describe self-care measures that may prevent or decrease complications (Diabetes Survival Skills Education) will improve Outcome: Progressing Goal: Individualized Educational Video(s) Outcome: Progressing   Problem: Coping: Goal: Ability to adjust to condition or change in health will improve Outcome: Progressing   Problem: Fluid Volume: Goal: Ability to maintain a balanced intake and output will improve Outcome: Progressing   Problem: Nutritional: Goal: Maintenance of adequate nutrition will improve Outcome: Progressing Goal: Progress toward achieving an optimal weight will improve Outcome: Progressing   Problem: Skin Integrity: Goal: Risk for impaired skin integrity will decrease Outcome: Progressing   Problem: Tissue Perfusion: Goal: Adequacy of tissue perfusion will improve Outcome: Progressing   Problem: Clinical Measurements: Goal: Ability to maintain clinical measurements within normal limits will improve Outcome: Progressing Goal: Will remain free from infection Outcome: Progressing Goal: Diagnostic test results will improve Outcome: Progressing Goal: Respiratory complications will improve Outcome: Progressing Goal: Cardiovascular complication will be avoided Outcome: Progressing   Problem: Coping: Goal: Level of anxiety will decrease Outcome: Progressing   Problem: Safety: Goal: Ability to remain free from injury will improve Outcome: Progressing   Problem: Skin Integrity: Goal: Risk for impaired skin integrity will decrease Outcome: Progressing

## 2024-03-30 ENCOUNTER — Telehealth: Payer: Self-pay

## 2024-03-30 NOTE — Transitions of Care (Post Inpatient/ED Visit) (Unsigned)
   03/30/2024  Name: [name] MRN: [id number] DOB: 06/24/1961  Today's TOC FU Call Status: Today's TOC FU Call Status:: Unsuccessful Call (1st Attempt) Unsuccessful Call (1st Attempt) Date: 03/30/24  Attempted to reach the patient regarding the most recent Inpatient/ED visit.  Follow Up Plan: Additional outreach attempts will be made to reach the patient to complete the Transitions of Care (Post Inpatient/ED visit) call.   Signature Julian Lemmings, LPN [facility] Nurse Health Advisor Direct Dial [phone]

## 2024-03-31 NOTE — Transitions of Care (Post Inpatient/ED Visit) (Unsigned)
   03/31/2024  Name: Shaheen Mende MRN: [id number] DOB: [birth year]  Today's TOC FU Call Status: Today's TOC FU Call Status:: Unsuccessful Call (2nd Attempt) Unsuccessful Call (1st Attempt) Date: 03/30/24 Unsuccessful Call (2nd Attempt) Date: 03/31/24  Attempted to reach the patient regarding the most recent Inpatient/ED visit.  Follow Up Plan: Additional outreach attempts will be made to reach the patient to complete the Transitions of Care (Post Inpatient/ED visit) call.   Signature Julian Lemmings, LPN [facility] Nurse Health Advisor Direct Dial [phone]

## 2024-04-01 NOTE — Transitions of Care (Post Inpatient/ED Visit) (Signed)
   04/01/2024  Name: Corey Petersen MRN: [id number] DOB: 1960-11-08  Today's TOC FU Call Status: Today's TOC FU Call Status:: Unsuccessful Call (3rd Attempt) Unsuccessful Call (1st Attempt) Date: 03/30/24 Unsuccessful Call (2nd Attempt) Date: 03/31/24 Unsuccessful Call (3rd Attempt) Date: 04/01/24  Attempted to reach the patient regarding the most recent Inpatient/ED visit.  Follow Up Plan: No further outreach attempts will be made at this time. We have been unable to contact the patient.  Signature Julian Lemmings, LPN [facility] Nurse Health Advisor Direct Dial [phone]

## 2024-04-29 ENCOUNTER — Ambulatory Visit: Attending: Nurse Practitioner | Admitting: Nurse Practitioner

## 2024-04-29 NOTE — Progress Notes (Deleted)
 Office Visit    Patient Name: Corey Petersen Date of Encounter: 04/29/2024  Primary Care Provider:  Sharma Coyer, MD Primary Cardiologist:  None    Chief Complaint    63 y.o. male   Past Medical History   Subjective   Past Medical History:  Diagnosis Date   Diabetes mellitus without complication (HCC)    DKA, type 2 (HCC) 07/09/2022   Hypertension    Past Surgical History:  Procedure Laterality Date   COLONOSCOPY N/A 03/09/2022   Procedure: COLONOSCOPY;  Surgeon: Jinny Carmine, MD;  Location: [facility] ENDOSCOPY;  Service: Endoscopy;  Laterality: N/A;   ESOPHAGOGASTRODUODENOSCOPY N/A 03/09/2022   Procedure: ESOPHAGOGASTRODUODENOSCOPY (EGD);  Surgeon: Jinny Carmine, MD;  Location: [facility] ENDOSCOPY;  Service: Endoscopy;  Laterality: N/A;   ESOPHAGOGASTRODUODENOSCOPY N/A 01/13/2024   Procedure: EGD (ESOPHAGOGASTRODUODENOSCOPY);  Surgeon: Jinny Carmine, MD;  Location: [facility] ENDOSCOPY;  Service: Endoscopy;  Laterality: N/A;   ESOPHAGOGASTRODUODENOSCOPY N/A 02/24/2024   Procedure: EGD (ESOPHAGOGASTRODUODENOSCOPY);  Surgeon: Jinny Carmine, MD;  Location: [facility] ENDOSCOPY;  Service: Endoscopy;  Laterality: N/A;   HERNIA REPAIR      Allergies  Allergies  Allergen Reactions   Ace Inhibitors Cough       History of Present Illness      63 y.o. y/o male {There is no content from the last Narrative History section.}     Objective   Home Medications    Current Outpatient Medications  Medication Sig Dispense Refill   amLODipine  (NORVASC ) 10 MG tablet TAKE 1 TABLET BY MOUTH EVERY DAY 90 tablet 0   aspirin  EC 81 MG tablet Take 1 tablet (81 mg total) by mouth daily. Swallow whole. 30 tablet 12   atorvastatin  (LIPITOR) 40 MG tablet TAKE 1 TABLET BY MOUTH EVERY DAY 90 tablet 1   Blood Glucose Monitoring Suppl (ONETOUCH VERIO) w/Device KIT Use to check blood sugar before meals and fasting, as as needed 1 kit 0   calcium  carbonate (TUMS - DOSED IN MG ELEMENTAL CALCIUM ) 500 MG  chewable tablet Chew 1 tablet (200 mg of elemental calcium  total) by mouth 3 (three) times daily with meals. (Patient taking differently: Chew 200 mg of elemental calcium  by mouth 3 (three) times daily with meals. As needed) 90 tablet 1   carvedilol  (COREG ) 12.5 MG tablet Take 1 tablet (12.5 mg total) by mouth 2 (two) times daily with a meal. 60 tablet 3   gabapentin  (NEURONTIN ) 300 MG capsule Take 1-2 capsules (300-600 mg total) by mouth 3 (three) times daily. (Patient taking differently: Take 300-600 mg by mouth at bedtime.) 90 capsule 3   glucose blood test strip Use as instructed 100 each 12   hydrochlorothiazide  (HYDRODIURIL ) 25 MG tablet Take 12.5 mg by mouth daily.     insulin  glargine (LANTUS  SOLOSTAR) 100 UNIT/ML Solostar Pen Inject 36 Units into the skin at bedtime.     Insulin  Pen Needle 29G X 12.7MM MISC 1 Needle by Does not apply route at bedtime. 100 each 3   losartan  (COZAAR ) 100 MG tablet Take 1 tablet (100 mg total) by mouth daily. 90 tablet 2   metFORMIN  (GLUCOPHAGE -XR) 500 MG 24 hr tablet Take 500 mg by mouth 2 (two) times daily with a meal.     metoCLOPramide  (REGLAN ) 10 MG tablet Take 1 tablet (10 mg total) by mouth every 8 (eight) hours as needed for nausea or vomiting. 20 tablet 0   ondansetron  (ZOFRAN ) 4 MG tablet Take 1 tablet (4 mg total) by mouth every 6 (six) hours as  needed for nausea. 10 tablet 0   pantoprazole  (PROTONIX ) 40 MG tablet Take 1 tablet (40 mg total) by mouth 2 (two) times daily. 60 tablet 3   senna-docusate (SENOKOT-S) 8.6-50 MG tablet Take 1 tablet by mouth daily as needed for mild constipation.     tadalafil  (CIALIS ) 20 MG tablet TAKE 1 TABLET BY MOUTH EVERY DAY AS NEEDED FOR ERECTILE DYSFUNCTION 30 tablet 5   No current facility-administered medications for this visit.     Physical Exam    VS:  There were no vitals taken for this visit. , BMI There is no height or weight on file to calculate BMI.          GEN: Well nourished, well developed, in no  acute distress. HEENT: normal. Neck: Supple, no JVD, carotid bruits, or masses. Cardiac: RRR, no murmurs, rubs, or gallops. No clubbing, cyanosis, edema.  Radials 2+/PT 2+ and equal bilaterally.  Respiratory:  Respirations regular and unlabored, clear to auscultation bilaterally. GI: Soft, nontender, nondistended, BS + x 4. MS: no deformity or atrophy. Skin: warm and dry, no rash. Neuro:  Strength and sensation are intact. Psych: Normal affect.  Accessory Clinical Findings    ECG personally reviewed by me today -    *** - no acute changes.  Lab Results  Component Value Date   WBC 8.5 03/28/2024   HGB 13.4 03/28/2024   HCT 38.9 (L) 03/28/2024   MCV 93.1 03/28/2024   PLT 268 03/28/2024   Lab Results  Component Value Date   CREATININE 1.12 03/28/2024   BUN 18 03/28/2024   NA 136 03/28/2024   K 3.4 (L) 03/28/2024   CL 104 03/28/2024   CO2 28 03/28/2024   Lab Results  Component Value Date   ALT 16 03/25/2024   AST 25 03/25/2024   ALKPHOS 96 03/25/2024   BILITOT 2.8 (H) 03/25/2024   Lab Results  Component Value Date   CHOL 125 03/27/2024   HDL 37 (L) 03/27/2024   LDLCALC 75 03/27/2024   TRIG 67 03/27/2024   CHOLHDL 3.4 03/27/2024    Lab Results  Component Value Date   HGBA1C 7.2 (H) 03/26/2024   Lab Results  Component Value Date   TSH 1.166 01/11/2024       Assessment & Plan    1.  ***  Lonni Meager, NP 04/29/2024, 11:16 AM

## 2024-05-12 DIAGNOSIS — I700 Atherosclerosis of aorta: Secondary | ICD-10-CM | POA: Diagnosis not present

## 2024-05-12 DIAGNOSIS — K2100 Gastro-esophageal reflux disease with esophagitis, without bleeding: Secondary | ICD-10-CM | POA: Diagnosis not present

## 2024-06-24 ENCOUNTER — Ambulatory Visit: Payer: Self-pay

## 2024-06-24 DIAGNOSIS — Z0000 Encounter for general adult medical examination without abnormal findings: Secondary | ICD-10-CM

## 2024-06-24 DIAGNOSIS — I152 Hypertension secondary to endocrine disorders: Secondary | ICD-10-CM

## 2024-06-24 NOTE — Patient Instructions (Addendum)
 Corey Petersen,  Thank you for taking the time for your Medicare Wellness Visit. I appreciate your continued commitment to your health goals. Please review the care plan we discussed, and feel free to reach out if I can assist you further.  Please note that Annual Wellness Visits do not include a physical exam. Some assessments may be limited, especially if the visit was conducted virtually. If needed, we may recommend an in-person follow-up with your provider.  Ongoing Care Seeing your primary care provider every 3 to 6 months helps us  monitor your health and provide consistent, personalized care.   Referrals If a referral was made during today's visit and you haven't received any updates within two weeks, please contact the referred provider directly to check on the status.  Recommended Screenings:  Health Maintenance  Topic Date Due   Eye exam for diabetics  Never done   Hepatitis C Screening  Never done   Pneumococcal Vaccine for age over 35 (1 of 2 - PCV) Never done   Yearly kidney health urinalysis for diabetes  09/06/2023   Complete foot exam   09/18/2023   Flu Shot  03/06/2024   COVID-19 Vaccine (4 - 2025-26 season) 04/06/2024   Hemoglobin A1C  09/26/2024   Yearly kidney function blood test for diabetes  03/28/2025   Medicare Annual Wellness Visit  06/24/2025   Colon Cancer Screening  03/10/2027   DTaP/Tdap/Td vaccine (2 - Td or Tdap) 08/09/2031   Hepatitis B Vaccine  Completed   HIV Screening  Completed   Zoster (Shingles) Vaccine  Completed   HPV Vaccine  Aged Out   Meningitis B Vaccine  Aged Out       06/24/2024    8:27 AM  Advanced Directives  Does Patient Have a Medical Advance Directive? No  Would patient like information on creating a medical advance directive? No - Patient declined    Vision: Annual vision screenings are recommended for early detection of glaucoma, cataracts, and diabetic retinopathy. These exams can also reveal signs of chronic conditions such  as diabetes and high blood pressure.  Dental: Annual dental screenings help detect early signs of oral cancer, gum disease, and other conditions linked to overall health, including heart disease and diabetes.  Please see the attached documents for additional preventive care recommendations.   NEXT AWV 06/30/25 @ 8:10 AM IN PERSON

## 2024-06-24 NOTE — Progress Notes (Signed)
 No chief complaint on file.    Subjective:   Corey Petersen is a 63 y.o. male who presents for a [employer] Visit.  Allergies (verified) Ace inhibitors   History: Past Medical History:  Diagnosis Date   Diabetes mellitus without complication (HCC)    DKA, type 2 (HCC) 07/09/2022   Hypertension    Past Surgical History:  Procedure Laterality Date   COLONOSCOPY N/A 03/09/2022   Procedure: COLONOSCOPY;  Surgeon: Jinny Carmine, MD;  Location: [facility] ENDOSCOPY;  Service: Endoscopy;  Laterality: N/A;   ESOPHAGOGASTRODUODENOSCOPY N/A 03/09/2022   Procedure: ESOPHAGOGASTRODUODENOSCOPY (EGD);  Surgeon: Jinny Carmine, MD;  Location[facility] ENDOSCOPY;  Service: Endoscopy;  Laterality: N/A;   ESOPHAGOGASTRODUODENOSCOPY N/A 01/13/2024   Procedure: EGD (ESOPHAGOGASTRODUODENOSCOPY);  Surgeon: Jinny Carmine, MD;  Location: [facility] ENDOSCOPY;  Service: Endoscopy;  Laterality: N/A;   ESOPHAGOGASTRODUODENOSCOPY N/A 02/24/2024   Procedure: EGD (ESOPHAGOGASTRODUODENOSCOPY);  Surgeon: Jinny Carmine, MD;  Location: [facility] ENDOSCOPY;  Service: Endoscopy;  Laterality: N/A;   HERNIA REPAIR     Family History  Problem Relation Age of Onset   Diabetes Mother    Hypertension Mother    Seizures Mother    Hypertension Father    Colon cancer Father    Social History   Occupational History   Occupation: disability  Tobacco Use   Smoking status: Never   Smokeless tobacco: Never  Vaping Use   Vaping status: Never Used  Substance and Sexual Activity   Alcohol use: Never   Drug use: Never   Sexual activity: Not Currently   Tobacco Counseling Counseling given: Not Answered  SDOH Screenings   Food Insecurity: Patient Declined (03/26/2024)  Housing: Patient Declined (03/26/2024)  Transportation Needs: Patient Declined (03/26/2024)  Utilities: Patient Declined (03/26/2024)  Alcohol Screen: Low Risk  (06/19/2023)  Depression (PHQ2-9): Medium Risk (07/01/2023)  Financial Resource Strain: Low Risk   (06/19/2023)  Physical Activity: Inactive (06/19/2023)  Social Connections: Patient Declined (03/26/2024)  Stress: No Stress Concern Present (06/19/2023)  Tobacco Use: Low Risk  (02/24/2024)  Health Literacy: Adequate Health Literacy (06/19/2023)   See flowsheets for full screening details  Depression Screen PHQ 2 & 9 Depression Scale- Over the past 2 weeks, how often have you been bothered by any of the following problems? Little interest or pleasure in doing things: 2 Feeling down, depressed, or hopeless (PHQ Adolescent also includes...irritable): 0 PHQ-2 Total Score: 2 Trouble falling or staying asleep, or sleeping too much: 1 Feeling tired or having little energy: 2 Poor appetite or overeating (PHQ Adolescent also includes...weight loss): 1 Feeling bad about yourself - or that you are a failure or have let yourself or your family down: 0 Trouble concentrating on things, such as reading the newspaper or watching television (PHQ Adolescent also includes...like school work): 0 Moving or speaking so slowly that other people could have noticed. Or the opposite - being so fidgety or restless that you have been moving around a lot more than usual: 0 Thoughts that you would be better off dead, or of hurting yourself in some way: 0 PHQ-9 Total Score: 6 If you checked off any problems, how difficult have these problems made it for you to do your work, take care of things at home, or get along with other people?: Somewhat difficult     Goals Addressed   None    Fall Screening Falls in the past year?: 0 Number of falls in past year: 0 Was there an injury with Fall?: 0 Fall Risk Category Calculator: 0 Patient  Fall Risk Level: Low fall risk  Advance Directives (For Healthcare) Does Patient Have a Medical Advance Directive?: No Would patient like information on creating a medical advance directive?: No - Patient declined        Objective:    There were no vitals filed for this  visit. There is no height or weight on file to calculate BMI.  Current Medications (verified) Outpatient Encounter Medications as of 06/24/2024  Medication Sig   amLODipine  (NORVASC ) 10 MG tablet TAKE 1 TABLET BY MOUTH EVERY DAY   aspirin  EC 81 MG tablet Take 1 tablet (81 mg total) by mouth daily. Swallow whole.   atorvastatin  (LIPITOR) 40 MG tablet TAKE 1 TABLET BY MOUTH EVERY DAY   Blood Glucose Monitoring Suppl (ONETOUCH VERIO) w/Device KIT Use to check blood sugar before meals and fasting, as as needed   calcium  carbonate (TUMS - DOSED IN MG ELEMENTAL CALCIUM ) 500 MG chewable tablet Chew 1 tablet (200 mg of elemental calcium  total) by mouth 3 (three) times daily with meals. (Patient taking differently: Chew 200 mg of elemental calcium  by mouth 3 (three) times daily with meals. As needed)   carvedilol  (COREG ) 12.5 MG tablet Take 1 tablet (12.5 mg total) by mouth 2 (two) times daily with a meal.   gabapentin  (NEURONTIN ) 300 MG capsule Take 1-2 capsules (300-600 mg total) by mouth 3 (three) times daily. (Patient taking differently: Take 300-600 mg by mouth at bedtime.)   glucose blood test strip Use as instructed   hydrochlorothiazide  (HYDRODIURIL ) 25 MG tablet Take 12.5 mg by mouth daily.   insulin  glargine (LANTUS  SOLOSTAR) 100 UNIT/ML Solostar Pen Inject 36 Units into the skin at bedtime.   Insulin  Pen Needle 29G X 12.7MM MISC 1 Needle by Does not apply route at bedtime.   losartan  (COZAAR ) 100 MG tablet Take 1 tablet (100 mg total) by mouth daily.   metFORMIN  (GLUCOPHAGE -XR) 500 MG 24 hr tablet Take 500 mg by mouth 2 (two) times daily with a meal.   metoCLOPramide  (REGLAN ) 10 MG tablet Take 1 tablet (10 mg total) by mouth every 8 (eight) hours as needed for nausea or vomiting.   ondansetron  (ZOFRAN ) 4 MG tablet Take 1 tablet (4 mg total) by mouth every 6 (six) hours as needed for nausea.   pantoprazole  (PROTONIX ) 40 MG tablet Take 1 tablet (40 mg total) by mouth 2 (two) times daily.    senna-docusate (SENOKOT-S) 8.6-50 MG tablet Take 1 tablet by mouth daily as needed for mild constipation.   tadalafil  (CIALIS ) 20 MG tablet TAKE 1 TABLET BY MOUTH EVERY DAY AS NEEDED FOR ERECTILE DYSFUNCTION   No facility-administered encounter medications on file as of 06/24/2024.   Hearing/Vision screen No results found. Immunizations and Health Maintenance Health Maintenance  Topic Date Due   OPHTHALMOLOGY EXAM  Never done   Hepatitis C Screening  Never done   Pneumococcal Vaccine: 50+ Years (1 of 2 - PCV) Never done   Diabetic kidney evaluation - Urine ACR  09/06/2023   FOOT EXAM  09/18/2023   Influenza Vaccine  03/06/2024   COVID-19 Vaccine (4 - 2025-26 season) 04/06/2024   Medicare Annual Wellness (AWV)  06/18/2024   HEMOGLOBIN A1C  09/26/2024   Diabetic kidney evaluation - eGFR measurement  03/28/2025   Colonoscopy  03/10/2027   DTaP/Tdap/Td (2 - Td or Tdap) 08/09/2031   Hepatitis B Vaccines 19-59 Average Risk  Completed   HIV Screening  Completed   Zoster Vaccines- Shingrix  Completed   HPV VACCINES  Aged Out   Meningococcal B Vaccine  Aged Out        Assessment/Plan:  This is a routine wellness examination for Corey Petersen.  Patient Care Team: Sharma Coyer, MD as PCP - General (Family Medicine) Darliss Rogue, MD as Consulting Physician (Cardiology) Verta Royden DASEN, [place] as Consulting Physician (Podiatry) Jinny Carmine, MD as Consulting Physician (Gastroenterology) Margaret Eduard SAUNDERS, MD as Consulting Physician (Neurology)  I have personally reviewed and noted the following in the patient's chart:   Medical and social history Use of alcohol, tobacco or illicit drugs  Current medications and supplements including opioid prescriptions. Functional ability and status Nutritional status Physical activity Advanced directives List of other physicians Hospitalizations, surgeries, and ER visits in previous 12 months Vitals Screenings to include cognitive,  depression, and falls Referrals and appointments  No orders of the defined types were placed in this encounter.  In addition, I have reviewed and discussed with patient certain preventive protocols, quality metrics, and best practice recommendations. A written personalized care plan for preventive services as well as general preventive health recommendations were provided to patient.   Jhonnie GORMAN Das, LPN   [id number]   No follow-ups on file.  After Visit Summary: (MyChart) Due to this being a telephonic visit, the after visit summary with patients personalized plan was offered to patient via MyChart   Nurse Notes: NEEDS FLU SHOT; UTD ON COLONOSCOPY; NEEDS FOOT EXAM

## 2025-06-30 ENCOUNTER — Ambulatory Visit
